# Patient Record
Sex: Female | Born: 1970
Health system: Southern US, Community
[De-identification: ages and names within clinical notes are randomized; demographics above are authoritative.]

## PROBLEM LIST (undated history)

## (undated) DIAGNOSIS — I8290 Acute embolism and thrombosis of unspecified vein: Secondary | ICD-10-CM

## (undated) DIAGNOSIS — K219 Gastro-esophageal reflux disease without esophagitis: Secondary | ICD-10-CM

## (undated) DIAGNOSIS — Z801 Family history of malignant neoplasm of trachea, bronchus and lung: Secondary | ICD-10-CM

## (undated) DIAGNOSIS — E559 Vitamin D deficiency, unspecified: Secondary | ICD-10-CM

## (undated) DIAGNOSIS — Z809 Family history of malignant neoplasm, unspecified: Secondary | ICD-10-CM

## (undated) DIAGNOSIS — R12 Heartburn: Secondary | ICD-10-CM

## (undated) DIAGNOSIS — T7840XA Allergy, unspecified, initial encounter: Secondary | ICD-10-CM

## (undated) DIAGNOSIS — Z8619 Personal history of other infectious and parasitic diseases: Secondary | ICD-10-CM

## (undated) DIAGNOSIS — K829 Disease of gallbladder, unspecified: Secondary | ICD-10-CM

## (undated) HISTORY — DX: Heartburn: R12

## (undated) HISTORY — PX: TONSILLECTOMY: SUR1361

## (undated) HISTORY — DX: Family history of malignant neoplasm of trachea, bronchus and lung: Z80.1

## (undated) HISTORY — PX: CHOLECYSTECTOMY: SHX55

## (undated) HISTORY — PX: WISDOM TOOTH EXTRACTION: SHX21

## (undated) HISTORY — PX: BREAST SURGERY: SHX581

## (undated) HISTORY — DX: Family history of malignant neoplasm, unspecified: Z80.9

## (undated) HISTORY — DX: Vitamin D deficiency, unspecified: E55.9

## (undated) HISTORY — PX: REFRACTIVE SURGERY: SHX103

## (undated) HISTORY — DX: Personal history of other infectious and parasitic diseases: Z86.19

## (undated) HISTORY — PX: SPINE SURGERY: SHX786

## (undated) HISTORY — PX: EYE SURGERY: SHX253

## (undated) HISTORY — DX: Allergy, unspecified, initial encounter: T78.40XA

## (undated) HISTORY — DX: Acute embolism and thrombosis of unspecified vein: I82.90

## (undated) HISTORY — DX: Gastro-esophageal reflux disease without esophagitis: K21.9

## (undated) HISTORY — DX: Disease of gallbladder, unspecified: K82.9

---

## 2006-08-14 ENCOUNTER — Encounter: Admission: RE | Admit: 2006-08-14 | Discharge: 2006-08-14 | Payer: Self-pay | Admitting: Gastroenterology

## 2006-09-15 ENCOUNTER — Ambulatory Visit (HOSPITAL_COMMUNITY): Admission: RE | Admit: 2006-09-15 | Discharge: 2006-09-15 | Payer: Self-pay | Admitting: General Surgery

## 2014-06-18 ENCOUNTER — Encounter (HOSPITAL_BASED_OUTPATIENT_CLINIC_OR_DEPARTMENT_OTHER): Payer: Self-pay | Admitting: Emergency Medicine

## 2014-06-18 ENCOUNTER — Emergency Department (HOSPITAL_BASED_OUTPATIENT_CLINIC_OR_DEPARTMENT_OTHER): Payer: BC Managed Care – PPO

## 2014-06-18 ENCOUNTER — Emergency Department (HOSPITAL_BASED_OUTPATIENT_CLINIC_OR_DEPARTMENT_OTHER)
Admission: EM | Admit: 2014-06-18 | Discharge: 2014-06-18 | Disposition: A | Discharge status: Home / Self Care | Payer: BC Managed Care – PPO | Attending: Emergency Medicine | Admitting: Emergency Medicine

## 2014-06-18 DIAGNOSIS — Z8619 Personal history of other infectious and parasitic diseases: Secondary | ICD-10-CM | POA: Diagnosis not present

## 2014-06-18 DIAGNOSIS — I82442 Acute embolism and thrombosis of left tibial vein: Secondary | ICD-10-CM | POA: Insufficient documentation

## 2014-06-18 DIAGNOSIS — I8290 Acute embolism and thrombosis of unspecified vein: Secondary | ICD-10-CM

## 2014-06-18 DIAGNOSIS — M79605 Pain in left leg: Secondary | ICD-10-CM | POA: Diagnosis present

## 2014-06-18 DIAGNOSIS — R52 Pain, unspecified: Secondary | ICD-10-CM

## 2014-06-18 NOTE — ED Notes (Signed)
NP at bedside, U/S completed

## 2014-06-18 NOTE — ED Notes (Addendum)
Pt presents to ED with complaints of left lower leg pain for the past week.  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

## 2014-06-18 NOTE — Discharge Instructions (Signed)
Please follow the directions provided.  It is very important that you follow-up with one of the referrals provided to help manage the thrombosis found in your calf.  According to your ultrasound there is an "Anterior tibial vein thrombosis at the location of focal pain and tenderness in left calf. No deep venous thrombosis elsewhere."  Don't hesitate to return for any new, worsening or concerning symptoms or any other symptoms listed below.     SEEK IMMEDIATE MEDICAL CARE IF:  You have chest pain.  You have trouble breathing.  You have new or increased swelling or pain in one leg.  You cough up blood.  You notice blood in vomit, in a bowel movement, or in urine.

## 2014-06-18 NOTE — ED Provider Notes (Signed)
CSN: [id number]     Arrival date & time 06/18/14  1110 History   First MD Initiated Contact with Patient 06/18/14 1305     Chief Complaint  Patient presents with  . Leg Pain   (Consider location/radiation/quality/duration/timing/severity/associated sxs/prior Treatment) HPI  Donna Mendoza is a 43 yo female presenting with report of left calf pain x 7 days.  She reports she noticed the pain upon waking 1 week ago.  She describes the pain as a "soreness" and rates appr 6-7/10.  She states it hurts worse in the morning and states it feels like a leg cramp that "doesn't go away."  She has not noticed any unilateral leg swelling or warmth in the left calf, only the soreness. 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 She does take exogenous estrogen for OCP.  She denies any chest pain, shortness of breath, hemoptysis, recent surgeries or injury.    History reviewed. No pertinent past medical history. Past Surgical History  Procedure Laterality Date  . Ruptured disc    . Cholecystectomy    . Tonsillectomy    . Breast surgery     No family history on file. History  Substance Use Topics  . Smoking status: Never Smoker   . Smokeless tobacco: Not on file  . Alcohol Use: Not on file   OB History    No data available     Review of Systems  Constitutional: Negative for fever and chills.  HENT: Negative for sore throat.   Eyes: Negative for visual disturbance.  Respiratory: Negative for cough and shortness of breath.   Cardiovascular: Negative for chest pain and leg swelling.  Gastrointestinal: Negative for nausea, vomiting and diarrhea.  Genitourinary: Negative for dysuria.  Musculoskeletal: Positive for myalgias.  Skin: Negative for rash.  Neurological: Negative for weakness, numbness and headaches.      Allergies  Amoxil  Home Medications   Prior to Admission medications   Not on File   BP 120/76 mmHg  Pulse 65  Temp(Src) 97.7 F (36.5 C) (Oral)  Resp 17  Ht  5\' 2"  (1.575 m)  Wt 185 lb (83.915 kg)  BMI 33.83 kg/m2  SpO2 97%  LMP 06/04/2014 Physical Exam  Constitutional: She appears well-developed and well-nourished. No distress.  HENT:  Head: Normocephalic and atraumatic.  Mouth/Throat: Oropharynx is clear and moist. No oropharyngeal exudate.  Eyes: Conjunctivae are normal.  Neck: Neck supple. No thyromegaly present.  Cardiovascular: Normal rate, regular rhythm and intact distal pulses.   Pulmonary/Chest: Effort normal and breath sounds normal. No respiratory distress. She has no wheezes. She has no rales. She exhibits no tenderness.  Abdominal: Soft. There is no tenderness.  Musculoskeletal: She exhibits tenderness.       Left lower leg: She exhibits tenderness. She exhibits no swelling and no edema.       Legs: Lymphadenopathy:    She has no cervical adenopathy.  Neurological: She is alert.  Skin: Skin is warm and dry. No rash noted. She is not diaphoretic.  Psychiatric: She has a normal mood and affect.  Nursing note and vitals reviewed.   ED Course  Procedures (including critical care time) Labs Review Labs Reviewed - No data to display  Imaging Review US Venous Img Lower Unilateral Left  06/18/2014   CLINICAL DATA:  Left lateral and posterior focal mid calf tightness for the past week after hitting the leg on an elliptical machine.  EXAM: LEFT LOWER EXTREMITY VENOUS DOPPLER ULTRASOUND  TECHNIQUE: Gray-scale sonography  with graded compression, as well as color Doppler and duplex ultrasound were performed to evaluate the lower extremity deep venous systems from the level of the common femoral vein and including the common femoral, femoral, profunda femoral, popliteal and calf veins including the posterior tibial, peroneal and gastrocnemius veins when visible. The superficial great saphenous vein was also interrogated. Spectral Doppler was utilized to evaluate flow at rest and with distal augmentation maneuvers in the common femoral,  femoral and popliteal veins.  COMPARISON:  None.  FINDINGS: Contralateral Common Femoral Vein: Respiratory phasicity is normal and symmetric with the symptomatic side. No evidence of thrombus. Normal compressibility.  Common Femoral Vein: No evidence of thrombus. Normal compressibility, respiratory phasicity and response to augmentation.  Saphenofemoral Junction: No evidence of thrombus. Normal compressibility and flow on color Doppler imaging.  Profunda Femoral Vein: No evidence of thrombus. Normal compressibility and flow on color Doppler imaging.  Femoral Vein: No evidence of thrombus. Normal compressibility, respiratory phasicity and response to augmentation.  Popliteal Vein: No evidence of thrombus. Normal compressibility, respiratory phasicity and response to augmentation.  Calf Veins: Thrombus occluding the proximal anterior tibial veins. The patient is focally tender at that location.  Superficial Great Saphenous Vein: No evidence of thrombus. Normal compressibility and flow on color Doppler imaging.  Venous Reflux:  None.  Other Findings:  None.  IMPRESSION: Anterior tibial vein thrombosis at the location of focal pain and tenderness in left calf. No deep venous thrombosis elsewhere.   Electronically Signed   By: Enrique Sack M.D.   On: 06/18/2014 13:41     EKG Interpretation None      MDM   Final diagnoses:  Venous thrombosis of leg   43 yo female with left calf pain  US shows anterior tibial vein thrombosis at the location of focal pain and tenderness in left calf. No deep venous thrombosis elsewhere.  Case discussed with Dr. Tamera Punt. Pt is without any other symptoms including any chest pain, or shortness of breath. Results discussed with pt and because of distal location of thrombosis, option provided to start xarelto today or follow-up with PCP to discuss use and mgmt of blood thinners vs serial ultrasounds.  Pt prefers to defer Xarelto today and will follow-up Monday.  She has been given  several referrals to allow for follow-up as soon as possible.  Strict return precautions provided including worsening pain, any chest pain, cough, hemoptysis or shortness of breath. She is well-appearing and appears reliable. She is aware of plan and in agreement.    Filed Vitals:   06/18/14 1116 06/18/14 1433  BP: 120/76 114/75  Pulse: 65 64  Temp: 97.7 F (36.5 C)   TempSrc: Oral   Resp: 17 18  Height: 5\' 2"  (1.575 m)   Weight: 185 lb (83.915 kg)   SpO2: 97% 98%   Meds given in ED:  Medications - No data to display  There are no discharge medications for this patient.      Britt Bottom, NP 06/20/14 0962  Malvin Johns, MD 06/21/14 1422

## 2014-06-20 ENCOUNTER — Ambulatory Visit (INDEPENDENT_AMBULATORY_CARE_PROVIDER_SITE_OTHER): Payer: BC Managed Care – PPO | Admitting: Physician Assistant

## 2014-06-20 ENCOUNTER — Encounter: Payer: Self-pay | Admitting: Physician Assistant

## 2014-06-20 VITALS — BP 119/84 | HR 68 | Temp 98.1°F | Resp 16 | Ht 62.0 in | Wt 195.2 lb

## 2014-06-20 DIAGNOSIS — I824Z2 Acute embolism and thrombosis of unspecified deep veins of left distal lower extremity: Secondary | ICD-10-CM

## 2014-06-20 DIAGNOSIS — Z86718 Personal history of other venous thrombosis and embolism: Secondary | ICD-10-CM | POA: Insufficient documentation

## 2014-06-20 MED ORDER — RIVAROXABAN 20 MG PO TABS: 20.0000 mg | ORAL_TABLET | Freq: Every day | ORAL | Status: DC

## 2014-06-20 MED ORDER — NORETHINDRONE ACET-ETHINYL EST 1-20 MG-MCG PO TABS: 1.0000 | ORAL_TABLET | Freq: Every day | ORAL | Status: DC

## 2014-06-20 NOTE — Assessment & Plan Note (Signed)
Will begin on Xarelto 15 mg BID x 21 days. Will then proceed with 20 mg tablets taken once daily.  Samples given to last first month. Rx Xarelto 20 mg daily and voucher given to patient. Estrogen-containing OCPs stopped.  Microgestin began today. Follow-up in 1 month. Alarm signs/symptoms discussed with patient.

## 2014-06-20 NOTE — Progress Notes (Signed)
Pre visit review using our clinic review tool, if applicable. No additional management support is needed unless otherwise documented below in the visit note/SLS  

## 2014-06-20 NOTE — Patient Instructions (Signed)
Please take the Xarelto as follows:             Take 15 mg tablet twice daily x 3 weeks.  Then begin 20 mg tablets, taking 1 tablet by mouth daily.  Will complete a total of 3 months of this medication.  Use voucher given to pick up new prescription for next months dosing.  If you develop any chest pain or shortness of breath, please call 911.  Stop current birth control and start the new pill as directed.  Follow-up in 1 month.

## 2014-06-20 NOTE — Progress Notes (Signed)
   Patient presents to clinic today to establish care and for ER follow-up. Patient seen in ER on 06/18/14 complaining of left calf pain.  US obtained revealing DVT of lower extremity.  Treatment options were discussed with patient.  Patient deferred treatment until follow-up.  Patient denies chest pain or shortness of breath.  Denies history of clot.  Is currently on estrogen-containing OCPs.  Is a non-smoker. Denies other complaints at today's visit.  Past Medical History  Diagnosis Date  . Venous thrombosis of leg     Left  . History of chicken pox     Past Surgical History  Procedure Laterality Date  . Ruptured disc    . Cholecystectomy    . Tonsillectomy    . Breast surgery      Fibroidadenoma-Left  . Wisdom tooth extraction    . Refractive surgery      Bilateral    No current outpatient prescriptions on file prior to visit.   No current facility-administered medications on file prior to visit.    Allergies  Allergen Reactions  . Amoxil [Amoxicillin] Hives    Family History  Problem Relation Age of Onset  . Brain cancer Mother 72    Deceased-Tumor  . Glaucoma Maternal Grandfather   . Glaucoma Maternal Aunt   . Cancer Maternal Grandmother   . Lung cancer Maternal Aunt   . Healthy Sister     x1    History   Social History  . Marital Status: Single    Spouse Name: N/A    Number of Children: N/A  . Years of Education: N/A   Occupational History  . Not on file.   Social History Main Topics  . Smoking status: Never Smoker   . Smokeless tobacco: Never Used  . Alcohol Use: 0.0 oz/week    0 Not specified per week     Comment: rare  . Drug Use: No  . Sexual Activity:    Partners: Male     Comment: boyfriend   Other Topics Concern  . Not on file   Social History Narrative   ROS Pertinent ROS are listed in HPI.  BP 119/84 mmHg  Pulse 68  Temp(Src) 98.1 F (36.7 C) (Oral)  Resp 16  Ht 5\' 2"  (1.575 m)  Wt 195 lb 4 oz (88.565 kg)  BMI 35.70 kg/m2   SpO2 98%  LMP 06/04/2014  Physical Exam  Constitutional: She is oriented to person, place, and time and well-developed, well-nourished, and in no distress.  HENT:  Head: Normocephalic and atraumatic.  Eyes: Conjunctivae are normal.  Neck: Neck supple.  Cardiovascular: Normal rate, regular rhythm, normal heart sounds and intact distal pulses.   Pulmonary/Chest: Effort normal and breath sounds normal. No respiratory distress. She has no wheezes. She has no rales. She exhibits no tenderness.  Peripheral pulses 2+ equal. No evidence of edema.  Neurological: She is alert and oriented to person, place, and time.  Skin: Skin is warm and dry. No rash noted.  Psychiatric: Affect normal.  Vitals reviewed.  Assessment/Plan: Acute deep vein thrombosis (DVT) of distal vein of left lower extremity Will begin on Xarelto 15 mg BID x 21 days. Will then proceed with 20 mg tablets taken once daily.  Samples given to last first month. Rx Xarelto 20 mg daily and voucher given to patient. Estrogen-containing OCPs stopped.  Microgestin began today. Follow-up in 1 month. Alarm signs/symptoms discussed with patient.

## 2014-06-23 ENCOUNTER — Other Ambulatory Visit: Payer: Self-pay | Admitting: Physician Assistant

## 2014-06-23 MED ORDER — NORETHINDRONE ACET-ETHINYL EST 1-20 MG-MCG PO TABS: 1.0000 | ORAL_TABLET | Freq: Every day | ORAL | Status: DC

## 2014-06-23 NOTE — Telephone Encounter (Signed)
[facility]

## 2014-06-23 NOTE — Telephone Encounter (Signed)
Caller name:Benko Alvita Relation to [place]:BZXY Call back number:[phone] [facility]  Reason for call: pt states Einar Pheasant was changing her birth control to [place] pt [facility] not [facility] yet pt is following up

## 2014-07-20 ENCOUNTER — Ambulatory Visit (INDEPENDENT_AMBULATORY_CARE_PROVIDER_SITE_OTHER): Payer: BC Managed Care – PPO | Admitting: Physician Assistant

## 2014-07-20 ENCOUNTER — Encounter: Payer: Self-pay | Admitting: Physician Assistant

## 2014-07-20 ENCOUNTER — Ambulatory Visit: Payer: BC Managed Care – PPO | Admitting: Physician Assistant

## 2014-07-20 VITALS — BP 140/84 | HR 67 | Temp 98.5°F | Resp 16 | Ht 62.0 in | Wt 199.5 lb

## 2014-07-20 DIAGNOSIS — I824Z2 Acute embolism and thrombosis of unspecified deep veins of left distal lower extremity: Secondary | ICD-10-CM

## 2014-07-20 DIAGNOSIS — Z3041 Encounter for surveillance of contraceptive pills: Secondary | ICD-10-CM

## 2014-07-20 NOTE — Patient Instructions (Signed)
Please continue the 20 mg daily Xarelto until two bottles have been finished. This will complete your 3 month course of medication.  Follow-up at the end of February so we can reassess things.  Return sooner if needed.  Deep Vein Thrombosis A deep vein thrombosis (DVT) is a blood clot that develops in the deep, larger veins of the leg, arm, or pelvis. These are more dangerous than clots that might form in veins near the surface of the body. A DVT can lead to serious and even life-threatening complications if the clot breaks off and travels in the bloodstream to the lungs.  A DVT can damage the valves in your leg veins so that instead of flowing upward, the blood pools in the lower leg. This is called post-thrombotic syndrome, and it can result in pain, swelling, discoloration, and sores on the leg. CAUSES Usually, several things contribute to the formation of blood clots. Contributing factors include:  The flow of blood slows down.  The inside of the vein is damaged in some way.  You have a condition that makes blood clot more easily. RISK FACTORS Some people are more likely than others to develop blood clots. Risk factors include:   Smoking.  Being overweight (obese).  Sitting or lying still for a long time. This includes long-distance travel, paralysis, or recovery from an illness or surgery. Other factors that increase risk are:   Older age, especially over 50 years of age.  Having a family history of blood clots or if you have already had a blot clot.  Having major or lengthy surgery. This is especially true for surgery on the hip, knee, or belly (abdomen). Hip surgery is particularly high risk.  Having a long, thin tube (catheter) placed inside a vein during a medical procedure.  Breaking a hip or leg.  Having cancer or cancer treatment.  Pregnancy and childbirth.  Hormone changes make the blood clot more easily during pregnancy.  The fetus puts pressure on the veins of  the pelvis.  There is a risk of injury to veins during delivery or a caesarean delivery. The risk is highest just after childbirth.  Medicines containing the female hormone estrogen. This includes birth control pills and hormone replacement therapy.  Other circulation or heart problems.  SIGNS AND SYMPTOMS When a clot forms, it can either partially or totally block the blood flow in that vein. Symptoms of a DVT can include:  Swelling of the leg or arm, especially if one side is much worse.  Warmth and redness of the leg or arm, especially if one side is much worse.  Pain in an arm or leg. If the clot is in the leg, symptoms may be more noticeable or worse when standing or walking. The symptoms of a DVT that has traveled to the lungs (pulmonary embolism, PE) usually start suddenly and include:  Shortness of breath.  Coughing.  Coughing up blood or blood-tinged mucus.  Chest pain. The chest pain is often worse with deep breaths.  Rapid heartbeat. Anyone with these symptoms should get emergency medical treatment right away. Do not wait to see if the symptoms will go away. Call your local emergency services (911 in the [place] if you have these symptoms. Do not drive yourself to the hospital. DIAGNOSIS If a DVT is suspected, your health care provider will take a full medical history and perform a physical exam. Tests that also may be required include:  Blood tests, including studies of the clotting properties of  the blood.  Ultrasound to see if you have clots in your legs or lungs.  X-rays to show the flow of blood when dye is injected into the veins (venogram).  Studies of your lungs if you have any chest symptoms. PREVENTION  Exercise the legs regularly. Take a brisk 30-minute walk every day.  Maintain a weight that is appropriate for your height.  Avoid sitting or lying in bed for long periods of time without moving your legs.  Women, particularly those over the age of 55  years, should consider the risks and benefits of taking estrogen medicines, including birth control pills.  Do not smoke, especially if you take estrogen medicines.  Long-distance travel can increase your risk of DVT. You should exercise your legs by walking or pumping the muscles every hour.  Many of the risk factors above relate to situations that exist with hospitalization, either for illness, injury, or elective surgery. Prevention may include medical and nonmedical measures.  Your health care provider will assess you for the need for venous thromboembolism prevention when you are admitted to the hospital. If you are having surgery, your surgeon will assess you the day of or day after surgery. TREATMENT Once identified, a DVT can be treated. It can also be prevented in some circumstances. Once you have had a DVT, you may be at increased risk for a DVT in the future. The most common treatment for DVT is blood-thinning (anticoagulant) medicine, which reduces the blood's tendency to clot. Anticoagulants can stop new blood clots from forming and stop old clots from growing. They cannot dissolve existing clots. Your body does this by itself over time. Anticoagulants can be given by mouth, through an IV tube, or by injection. Your health care provider will determine the best program for you. Other medicines or treatments that may be used are:  Heparin or related medicines (low molecular weight heparin) are often the first treatment for a blood clot. They act quickly. However, they cannot be taken orally and must be given either in shot form or by IV tube.  Heparin can cause a fall in a component of blood that stops bleeding and forms blood clots (platelets). You will be monitored with blood tests to be sure this does not occur.  Warfarin is an anticoagulant that can be swallowed. It takes a few days to start working, so usually heparin or related medicines are used in combination. Once warfarin is  working, heparin is usually stopped.  Factor Xa inhibitor medicines, such as rivaroxaban and apixaban, also reduce blood clotting. These medicines are taken orally and can often be used without heparin or related medicines.  Less commonly, clot dissolving drugs (thrombolytics) are used to dissolve a DVT. They carry a high risk of bleeding, so they are used mainly in severe cases where your life or a part of your body is threatened.  Very rarely, a blood clot in the leg needs to be removed surgically.  If you are unable to take anticoagulants, your health care provider may arrange for you to have a filter placed in a main vein in your abdomen. This filter prevents clots from traveling to your lungs. HOME CARE INSTRUCTIONS  Take all medicines as directed by your health care provider.  Learn as much as you can about DVT.  Wear a medical alert bracelet or carry a medical alert card.  Ask your health care provider how soon you can go back to normal activities. It is important to stay active to  prevent blood clots. If you are on anticoagulant medicine, avoid contact sports.  It is very important to exercise. This is especially important while traveling, sitting, or standing for long periods of time. Exercise your legs by walking or by tightening and relaxing your leg muscles regularly. Take frequent walks.  You may need to wear compression stockings. These are tight elastic stockings that apply pressure to the lower legs. This pressure can help keep the blood in the legs from clotting. Taking Warfarin Warfarin is a daily medicine that is taken by mouth. Your health care provider will advise you on the length of treatment (usually 3-6 months, sometimes lifelong). If you take warfarin:  Understand how to take warfarin and foods that can affect how warfarin works in Veterinary surgeon.  Too much and too little warfarin are both dangerous. Too much warfarin increases the risk of bleeding. Too little warfarin  continues to allow the risk for blood clots. Warfarin and Regular Blood Testing While taking warfarin, you will need to have regular blood tests to measure your blood clotting time. These blood tests usually include both the prothrombin time (PT) and international normalized ratio (INR) tests. The PT and INR results allow your health care provider to adjust your dose of warfarin. It is very important that you have your PT and INR tested as often as directed by your health care provider.  Warfarin and Your Diet Avoid major changes in your diet, or notify your health care provider before changing your diet. Arrange a visit with a registered dietitian to answer your questions. Many foods, especially foods high in vitamin K, can interfere with warfarin and affect the PT and INR results. You should eat a consistent amount of foods high in vitamin K. Foods high in vitamin K include:   Spinach, kale, broccoli, cabbage, collard and turnip greens, Brussels sprouts, peas, cauliflower, seaweed, and parsley.  Beef and pork liver.  Green tea.  Soybean oil. Warfarin with Other Medicines Many medicines can interfere with warfarin and affect the PT and INR results. You must:  Tell your health care provider about any and all medicines, vitamins, and supplements you take, including aspirin and other over-the-counter anti-inflammatory medicines. Be especially cautious with aspirin and anti-inflammatory medicines. Ask your health care provider before taking these.  Do not take or discontinue any prescribed or over-the-counter medicine except on the advice of your health care provider or pharmacist. Warfarin Side Effects Warfarin can have side effects, such as easy bruising and difficulty stopping bleeding. Ask your health care provider or pharmacist about other side effects of warfarin. You will need to:  Hold pressure over cuts for longer than usual.  Notify your dentist and other health care providers that  you are taking warfarin before you undergo any procedures where bleeding may occur. Warfarin with Alcohol and Tobacco   Drinking alcohol frequently can increase the effect of warfarin, leading to excess bleeding. It is best to avoid alcoholic drinks or to consume only very small amounts while taking warfarin. Notify your health care provider if you change your alcohol intake.   Do not use any tobacco products including cigarettes, chewing tobacco, or electronic cigarettes. If you smoke, quit. Ask your health care provider for help with quitting smoking. Alternative Medicines to Warfarin: Factor Xa Inhibitor Medicines  These blood-thinning medicines are taken by mouth, usually for several weeks or longer. It is important to take the medicine every single day at the same time each day.  There are no regular  blood tests required when using these medicines.  There are fewer food and drug interactions than with warfarin.  The side effects of this class of medicine are similar to those of warfarin, including excessive bruising or bleeding. Ask your health care provider or pharmacist about other potential side effects. SEEK MEDICAL CARE IF:  You notice a rapid heartbeat.  You feel weaker or more tired than usual.  You feel faint.  You notice increased bruising.  You feel your symptoms are not getting better in the time expected.  You believe you are having side effects of medicine. SEEK IMMEDIATE MEDICAL CARE IF:  You have chest pain.  You have trouble breathing.  You have new or increased swelling or pain in one leg.  You cough up blood.  You notice blood in vomit, in a bowel movement, or in urine. MAKE SURE YOU:  Understand these instructions.  Will watch your condition.  Will get help right away if you are not doing well or get worse. Document Released: 07/08/2005 Document Revised: 11/22/2013 Document Reviewed: 03/15/2013 [facility] Patient Information 2015 [place], [place].  This information is not intended to replace advice given to you by your health care provider. Make sure you discuss any questions you have with your health care provider.

## 2014-07-20 NOTE — Progress Notes (Signed)
Pre visit review using our clinic review tool, if applicable. No additional management support is needed unless otherwise documented below in the visit note/SLS  

## 2014-07-24 DIAGNOSIS — Z309 Encounter for contraceptive management, unspecified: Secondary | ICD-10-CM | POA: Insufficient documentation

## 2014-07-24 NOTE — Assessment & Plan Note (Signed)
Patient doing well. She is to continue current regimen of Cymbalta 20 mg daily. Will continue medication regimen until a total of 3 months of therapy has been completed. Again alarm signs/symptoms have been reiterated to patient. Follow-up in 2 months.

## 2014-07-24 NOTE — Assessment & Plan Note (Signed)
Currently avoiding estrogen containing oral contraceptives giving recent DVT. Patient doing well on Microgestin daily. Will continue the same.

## 2014-07-24 NOTE — Progress Notes (Signed)
Patient presents to clinic today for one-month follow-up after being diagnosed with a DVT of left lower extremity. Patient was placed on anticoagulant therapy with relative. Is now currently on 20 mg daily. Patient endorses taking medication as directed. Patient states swelling of the leg is much improved. Denies calf pain, chest pain or shortness of breath. Patient has started the new progesterone only oral contraceptive pill and denies any noted side effects.  Past Medical History  Diagnosis Date  . Venous thrombosis of leg     Left  . History of chicken pox     Current Outpatient Prescriptions on File Prior to Visit  Medication Sig Dispense Refill  . Multiple Vitamin (MULTIVITAMIN) tablet Take 1 tablet by mouth as directed.    . norethindrone-ethinyl estradiol (MICROGESTIN) 1-20 MG-MCG tablet Take 1 tablet by mouth daily. 3 Package 1  . rivaroxaban (XARELTO) 20 MG TABS tablet Take 1 tablet (20 mg total) by mouth daily with supper. 30 tablet 0   No current facility-administered medications on file prior to visit.    Allergies  Allergen Reactions  . Amoxil [Amoxicillin] Hives    Family History  Problem Relation Age of Onset  . Brain cancer Mother 59    Deceased-Tumor  . Glaucoma Maternal Grandfather   . Glaucoma Maternal Aunt   . Cancer Maternal Grandmother   . Lung cancer Maternal Aunt   . Healthy Sister     x1    History   Social History  . Marital Status: Single    Spouse Name: N/A    Number of Children: N/A  . Years of Education: N/A   Social History Main Topics  . Smoking status: Never Smoker   . Smokeless tobacco: Never Used  . Alcohol Use: 0.0 oz/week    0 Not specified per week     Comment: rare  . Drug Use: No  . Sexual Activity:    Partners: Male     Comment: boyfriend   Other Topics Concern  . None   Social History Narrative   Review of Systems - See HPI.  All other ROS are negative.  BP 140/84 mmHg  Pulse 67  Temp(Src) 98.5 F (36.9  C) (Oral)  Resp 16  Ht 5\' 2"  (1.575 m)  Wt 199 lb 8 oz (90.493 kg)  BMI 36.48 kg/m2  SpO2 99%  LMP 07/03/2014  Physical Exam  Constitutional: She is oriented to person, place, and time and well-developed, well-nourished, and in no distress.  HENT:  Head: Normocephalic and atraumatic.  Eyes: Conjunctivae are normal. Pupils are equal, round, and reactive to light.  Neck: Neck supple.  Cardiovascular: Normal rate, regular rhythm, normal heart sounds and intact distal pulses.   Pulmonary/Chest: Effort normal and breath sounds normal. No respiratory distress. She has no wheezes. She has no rales. She exhibits no tenderness.  Musculoskeletal: Normal range of motion. She exhibits no edema or tenderness.  Neurological: She is alert and oriented to person, place, and time.  Skin: Skin is warm and dry. No rash noted.  Psychiatric: Affect normal.  Vitals reviewed.  Assessment/Plan: Acute deep vein thrombosis (DVT) of distal vein of left lower extremity Patient doing well. She is to continue current regimen of Cymbalta 20 mg daily. Will continue medication regimen until a total of 3 months of therapy has been completed. Again alarm signs/symptoms have been reiterated to patient. Follow-up in 2 months.  Contraceptive management Currently avoiding estrogen containing oral contraceptives giving recent DVT. Patient doing well  on Microgestin daily. Will continue the same.

## 2014-07-26 ENCOUNTER — Telehealth: Payer: Self-pay

## 2014-07-26 MED ORDER — RIVAROXABAN 20 MG PO TABS: 20.0000 mg | ORAL_TABLET | Freq: Every day | ORAL | Status: DC

## 2014-07-26 NOTE — Telephone Encounter (Signed)
Rx request to pharmacy/[facility]  

## 2014-07-26 NOTE — Telephone Encounter (Signed)
Donna Mendoza  [phone] [place] called to check on refill for rivaroxaban (XARELTO) 20 MG TABS tablet, she still has about 3 weeks supply, she just did not want to run out.

## 2014-07-27 MED ORDER — RIVAROXABAN 20 MG PO TABS: 20.0000 mg | ORAL_TABLET | Freq: Every day | ORAL | Status: DC

## 2014-07-27 NOTE — Telephone Encounter (Signed)
[facility]

## 2014-07-27 NOTE — Addendum Note (Signed)
Addended by: Rockwell Germany on: 07/27/2014 10:18 AM   Modules accepted: Orders

## 2014-07-27 NOTE — Telephone Encounter (Signed)
Rx to local pharmacy/[facility]

## 2014-07-27 NOTE — Telephone Encounter (Signed)
Per patient, refill should have been sent to [facility] on [address]

## 2014-09-20 ENCOUNTER — Ambulatory Visit (INDEPENDENT_AMBULATORY_CARE_PROVIDER_SITE_OTHER): Payer: BLUE CROSS/BLUE SHIELD | Admitting: Physician Assistant

## 2014-09-20 ENCOUNTER — Encounter: Payer: Self-pay | Admitting: Physician Assistant

## 2014-09-20 VITALS — BP 116/69 | HR 62 | Temp 98.4°F | Resp 16 | Ht 62.0 in | Wt 198.1 lb

## 2014-09-20 DIAGNOSIS — I824Z2 Acute embolism and thrombosis of unspecified deep veins of left distal lower extremity: Secondary | ICD-10-CM

## 2014-09-20 DIAGNOSIS — R10819 Abdominal tenderness, unspecified site: Secondary | ICD-10-CM

## 2014-09-20 LAB — CBC WITH DIFFERENTIAL/PLATELET
Basophils Absolute: 0 10*3/uL (ref 0.0–0.1)
Basophils Relative: 0.5 % (ref 0.0–3.0)
Eosinophils Absolute: 0.1 10*3/uL (ref 0.0–0.7)
Eosinophils Relative: 1.8 % (ref 0.0–5.0)
HCT: 44.6 % (ref 36.0–46.0)
Hemoglobin: 15.2 g/dL — ABNORMAL HIGH (ref 12.0–15.0)
Lymphocytes Relative: 21 % (ref 12.0–46.0)
Lymphs Abs: 1.7 10*3/uL (ref 0.7–4.0)
MCHC: 34.1 g/dL (ref 30.0–36.0)
MCV: 87.2 fl (ref 78.0–100.0)
Monocytes Absolute: 0.5 10*3/uL (ref 0.1–1.0)
Monocytes Relative: 6.3 % (ref 3.0–12.0)
Neutro Abs: 5.6 10*3/uL (ref 1.4–7.7)
Neutrophils Relative %: 70.4 % (ref 43.0–77.0)
Platelets: 270 10*3/uL (ref 150.0–400.0)
RBC: 5.11 Mil/uL (ref 3.87–5.11)
RDW: 13.8 % (ref 11.5–15.5)
WBC: 7.9 10*3/uL (ref 4.0–10.5)

## 2014-09-20 LAB — COMPREHENSIVE METABOLIC PANEL
ALT: 15 U/L (ref 0–35)
AST: 17 U/L (ref 0–37)
Albumin: 3.8 g/dL (ref 3.5–5.2)
Alkaline Phosphatase: 65 U/L (ref 39–117)
BUN: 19 mg/dL (ref 6–23)
CO2: 30 mEq/L (ref 19–32)
Calcium: 9 mg/dL (ref 8.4–10.5)
Chloride: 105 mEq/L (ref 96–112)
Creatinine, Ser: 0.75 mg/dL (ref 0.40–1.20)
GFR: 88.96 mL/min (ref >60.00)
Glucose, Bld: 100 mg/dL — ABNORMAL HIGH (ref 70–99)
Potassium: 4.3 mEq/L (ref 3.5–5.1)
Sodium: 138 mEq/L (ref 135–145)
Total Bilirubin: 0.4 mg/dL (ref 0.2–1.2)
Total Protein: 7.1 g/dL (ref 6.0–8.3)

## 2014-09-20 LAB — URINALYSIS, ROUTINE W REFLEX MICROSCOPIC
Bilirubin Urine: NEGATIVE
Hgb urine dipstick: NEGATIVE
Ketones, ur: NEGATIVE
Leukocytes, UA: NEGATIVE
Nitrite: NEGATIVE
RBC / HPF: NONE SEEN (ref 0–?)
Specific Gravity, Urine: 1.025 (ref 1.000–1.030)
Total Protein, Urine: NEGATIVE
Urine Glucose: NEGATIVE
Urobilinogen, UA: 0.2 (ref 0.0–1.0)
WBC, UA: NONE SEEN (ref 0–?)
pH: 6 (ref 5.0–8.0)

## 2014-09-20 MED ORDER — NORETHINDRONE ACET-ETHINYL EST 1-20 MG-MCG PO TABS: 1.0000 | ORAL_TABLET | Freq: Every day | ORAL | Status: DC

## 2014-09-20 NOTE — Patient Instructions (Signed)
Please stop by the lab for blood work. I will call you with your results. Since we are stopping the Xarelto, you can alternate between Tylenol and Ibuprofen for the pain.  I will get your MRI from the spine specialist.  I will review so we can determine what further workup is needed to look at your side.

## 2014-09-20 NOTE — Progress Notes (Signed)
Patient presents to clinic today for follow-up of DVT of LLE having just completed 3 months of Xarelto therapy.  No history of prior DVT.  Denies hx of hypercoagulable state.  Denies residual swelling or calf pain/tenderness. Is staying active.  Patient does endorse history of left renal cyst/mass on MRI obtained from previous PMD. Has been asymptomatic, but patient endorses over the past couple of weeks she has had intermittent L side and flank pain.  Denies urinary symptoms.  Denies history of neprholithiaisis  Past Medical History  Diagnosis Date  . Venous thrombosis of leg     Left  . History of chicken pox     Current Outpatient Prescriptions on File Prior to Visit  Medication Sig Dispense Refill  . Multiple Vitamin (MULTIVITAMIN) tablet Take 1 tablet by mouth as directed.     No current facility-administered medications on file prior to visit.    Allergies  Allergen Reactions  . Amoxil [Amoxicillin] Hives    Family History  Problem Relation Age of Onset  . Brain cancer Mother 78    Deceased-Tumor  . Glaucoma Maternal Grandfather   . Glaucoma Maternal Aunt   . Cancer Maternal Grandmother   . Lung cancer Maternal Aunt   . Healthy Sister     x1    History   Social History  . Marital Status: Single    Spouse Name: N/A  . Number of Children: N/A  . Years of Education: N/A   Social History Main Topics  . Smoking status: Never Smoker   . Smokeless tobacco: Never Used  . Alcohol Use: 0.0 oz/week    0 Standard drinks or equivalent per week     Comment: rare  . Drug Use: No  . Sexual Activity:    Partners: Male     Comment: boyfriend   Other Topics Concern  . None   Social History Narrative   Review of Systems - See HPI.  All other ROS are negative.  BP 116/69 mmHg  Pulse 62  Temp(Src) 98.4 F (36.9 C) (Oral)  Resp 16  Ht 5\' 2"  (1.575 m)  Wt 198 lb 2 oz (89.869 kg)  BMI 36.23 kg/m2  SpO2 99%  LMP 08/31/2014  Physical Exam  Constitutional: She  is oriented to person, place, and time and well-developed, well-nourished, and in no distress.  HENT:  Head: Normocephalic and atraumatic.  Cardiovascular: Normal rate, regular rhythm, normal heart sounds and intact distal pulses.   Pulmonary/Chest: Effort normal and breath sounds normal. No respiratory distress. She has no wheezes. She has no rales. She exhibits no tenderness.  Abdominal: Soft. Bowel sounds are normal. She exhibits no distension and no mass. There is no tenderness. There is no rebound and no guarding.  Neurological: She is alert and oriented to person, place, and time.  Skin: Skin is warm and dry. No rash noted.  Psychiatric: Affect normal.  Vitals reviewed.  Assessment/Plan: Acute deep vein thrombosis (DVT) of distal vein of left lower extremity 1st and only episode. Patient is off of estrogen-containing OCPs. HAs completed 3 months of some relative therapy. She is asymptomatic. Examination within normal limits. Okay to discontinue Xarelto. Patient has been educated on alarm signs or symptoms that would indicate possible recurrence of DVT.   Abdominal tenderness in left flank Unclear etiology. Do not suspect pyelonephritis. Possible muscular etiology, although there is not significant tenderness on examination. Will obtain CBC, CMP and UA. Will obtain record of previous MRI. Once reviewed, we will proceed  with repeat imaging.

## 2014-09-21 DIAGNOSIS — R108A2 Left flank tenderness: Secondary | ICD-10-CM | POA: Insufficient documentation

## 2014-09-21 DIAGNOSIS — R10819 Abdominal tenderness, unspecified site: Secondary | ICD-10-CM | POA: Insufficient documentation

## 2014-09-21 NOTE — Assessment & Plan Note (Signed)
1st and only episode. Patient is off of estrogen-containing OCPs. HAs completed 3 months of some relative therapy. She is asymptomatic. Examination within normal limits. Okay to discontinue Xarelto. Patient has been educated on alarm signs or symptoms that would indicate possible recurrence of DVT.

## 2014-09-21 NOTE — Assessment & Plan Note (Signed)
Unclear etiology. Do not suspect pyelonephritis. Possible muscular etiology, although there is not significant tenderness on examination. Will obtain CBC, CMP and UA. Will obtain record of previous MRI. Once reviewed, we will proceed with repeat imaging.

## 2014-11-07 ENCOUNTER — Encounter: Payer: Self-pay | Admitting: Physician Assistant

## 2015-02-14 ENCOUNTER — Encounter: Payer: Self-pay | Admitting: Physician Assistant

## 2015-02-14 ENCOUNTER — Ambulatory Visit (INDEPENDENT_AMBULATORY_CARE_PROVIDER_SITE_OTHER): Payer: BLUE CROSS/BLUE SHIELD | Admitting: Physician Assistant

## 2015-02-14 VITALS — BP 108/88 | HR 69 | Temp 98.1°F | Ht 62.0 in | Wt 203.2 lb

## 2015-02-14 DIAGNOSIS — Z136 Encounter for screening for cardiovascular disorders: Secondary | ICD-10-CM | POA: Insufficient documentation

## 2015-02-14 DIAGNOSIS — L810 Postinflammatory hyperpigmentation: Secondary | ICD-10-CM | POA: Diagnosis not present

## 2015-02-14 DIAGNOSIS — Z0000 Encounter for general adult medical examination without abnormal findings: Secondary | ICD-10-CM | POA: Diagnosis not present

## 2015-02-14 DIAGNOSIS — R638 Other symptoms and signs concerning food and fluid intake: Secondary | ICD-10-CM

## 2015-02-14 LAB — COMPREHENSIVE METABOLIC PANEL
ALT: 27 U/L (ref 0–35)
AST: 25 U/L (ref 0–37)
Albumin: 3.6 g/dL (ref 3.5–5.2)
Alkaline Phosphatase: 73 U/L (ref 39–117)
BUN: 12 mg/dL (ref 6–23)
CO2: 26 mEq/L (ref 19–32)
Calcium: 8.7 mg/dL (ref 8.4–10.5)
Chloride: 107 mEq/L (ref 96–112)
Creatinine, Ser: 0.63 mg/dL (ref 0.40–1.20)
GFR: 108.59 mL/min (ref >60.00)
Glucose, Bld: 81 mg/dL (ref 70–99)
Potassium: 3.6 mEq/L (ref 3.5–5.1)
Sodium: 140 mEq/L (ref 135–145)
Total Bilirubin: 0.6 mg/dL (ref 0.2–1.2)
Total Protein: 6.4 g/dL (ref 6.0–8.3)

## 2015-02-14 LAB — LIPID PANEL
Cholesterol: 163 mg/dL (ref 0–200)
HDL: 54.3 mg/dL (ref >39.00)
LDL Cholesterol: 94 mg/dL (ref 0–99)
NonHDL: 108.7
Total CHOL/HDL Ratio: 3
Triglycerides: 75 mg/dL (ref 0.0–149.0)
VLDL: 15 mg/dL (ref 0.0–40.0)

## 2015-02-14 LAB — URINALYSIS, ROUTINE W REFLEX MICROSCOPIC
Bilirubin Urine: NEGATIVE
Ketones, ur: NEGATIVE
Leukocytes, UA: NEGATIVE
Nitrite: NEGATIVE
Specific Gravity, Urine: 1.025 (ref 1.000–1.030)
Total Protein, Urine: NEGATIVE
Urine Glucose: NEGATIVE
Urobilinogen, UA: 0.2 (ref 0.0–1.0)
pH: 5.5 (ref 5.0–8.0)

## 2015-02-14 LAB — T4, FREE: Free T4: 0.82 ng/dL (ref 0.60–1.60)

## 2015-02-14 LAB — CBC
HCT: 40.5 % (ref 36.0–46.0)
Hemoglobin: 13.6 g/dL (ref 12.0–15.0)
MCHC: 33.7 g/dL (ref 30.0–36.0)
MCV: 89.3 fl (ref 78.0–100.0)
Platelets: 268 10*3/uL (ref 150.0–400.0)
RBC: 4.53 Mil/uL (ref 3.87–5.11)
RDW: 13.5 % (ref 11.5–15.5)
WBC: 6.9 10*3/uL (ref 4.0–10.5)

## 2015-02-14 LAB — HEMOGLOBIN A1C: Hgb A1c MFr Bld: 5.6 % (ref 4.6–6.5)

## 2015-02-14 LAB — TSH: TSH: 0.87 u[IU]/mL (ref 0.35–4.50)

## 2015-02-14 NOTE — Assessment & Plan Note (Signed)
Difficulty with weight loss despite daily exercise regimen and decreased caloric intake. Cold intolerance noted. Exam without thyromegaly or palpable nodule. Reflexes WNL. Will check TSH and T4 today.

## 2015-02-14 NOTE — Assessment & Plan Note (Signed)
Depression screen negative. Followed by [facility]YN [facility]) who has updated mammogram and PAP, both unremarkable per patient. Preventive schedule reviewed and handout given in AVS. Tetanus due but patient declines today.  Will obtain fasting labs today.

## 2015-02-14 NOTE — Progress Notes (Signed)
Patient presents to clinic today for annual exam.  Patient is fasting for labs.   Acute Concerns: (1) Patient endorses difficulty with weight loss despite rigorous exercise regimen daily and decreased caloric intake. Denies history of abnormal thyroid testing. Endorses some fatigue, cold natured. Denies known family history of thyroid disorder.  (2) Patient also endorses bite to left anterior thigh x 1.5 month that is non-painful, non-pruritic but is having some delayed healing. Denies drainage. Denies fever, chills or redness.  Health Maintenance: Dental -- up-to-date Vision -- up-to-date Immunizations -- Due for Tetanus. Mammogram -- Followed by GYN [facility]). PAP -- Followed by GYN [facility] Gyn). January 04, 2015. Pap normal  Past Medical History  Diagnosis Date  . Venous thrombosis of leg     Left  . History of chicken pox     Past Surgical History  Procedure Laterality Date  . Ruptured disc    . Cholecystectomy    . Tonsillectomy    . Breast surgery      Fibroidadenoma-Left  . Wisdom tooth extraction    . Refractive surgery      Bilateral    Current Outpatient Prescriptions on File Prior to Visit  Medication Sig Dispense Refill  . Multiple Vitamin (MULTIVITAMIN) tablet Take 1 tablet by mouth as directed.    . norethindrone-ethinyl estradiol (MICROGESTIN) 1-20 MG-MCG tablet Take 1 tablet by mouth daily. (Patient not taking: Reported on 02/14/2015) 3 Package 0   No current facility-administered medications on file prior to visit.    Allergies  Allergen Reactions  . Amoxil [Amoxicillin] Hives    Family History  Problem Relation Age of Onset  . Brain cancer Mother 32    Deceased-Tumor  . Glaucoma Maternal Grandfather   . Glaucoma Maternal Aunt   . Cancer Maternal Grandmother   . Lung cancer Maternal Aunt   . Healthy Sister     x1    History   Social History  . Marital Status: Single    Spouse Name: N/A  . Number of Children: N/A  . Years of  Education: N/A   Occupational History  . Not on file.   Social History Main Topics  . Smoking status: Never Smoker   . Smokeless tobacco: Never Used  . Alcohol Use: 0.0 oz/week    0 Standard drinks or equivalent per week     Comment: rare  . Drug Use: No  . Sexual Activity:    Partners: Male     Comment: boyfriend   Other Topics Concern  . Not on file   Social History Narrative   Review of Systems  Constitutional: Negative for fever and weight loss.  HENT: Negative for ear discharge, ear pain, hearing loss and tinnitus.   Eyes: Negative for blurred vision, double vision, photophobia and pain.  Respiratory: Negative for cough and shortness of breath.   Cardiovascular: Negative for chest pain and palpitations.  Gastrointestinal: Negative for heartburn, nausea, vomiting, abdominal pain, diarrhea, constipation, blood in stool and melena.  Genitourinary: Negative for dysuria, urgency, frequency, hematuria and flank pain.  Musculoskeletal: Negative for falls.  Neurological: Negative for dizziness, loss of consciousness and headaches.  Endo/Heme/Allergies: Negative for environmental allergies.  Psychiatric/Behavioral: Negative for depression, suicidal ideas, hallucinations and substance abuse. The patient is not nervous/anxious and does not have insomnia.    BP 108/88 mmHg  Pulse 69  Temp(Src) 98.1 F (36.7 C) (Oral)  Ht 5\' 2"  (1.575 m)  Wt 203 lb 3.2 oz (92.171 kg)  BMI  37.16 kg/m2  SpO2 98%  LMP 09/05/2014  Physical Exam  Constitutional: She is oriented to person, place, and time and well-developed, well-nourished, and in no distress.  HENT:  Head: Normocephalic and atraumatic.  Right Ear: Tympanic membrane, external ear and ear canal normal.  Left Ear: Tympanic membrane, external ear and ear canal normal.  Nose: Nose normal. No mucosal edema.  Mouth/Throat: Uvula is midline, oropharynx is clear and moist and mucous membranes are normal. No oropharyngeal exudate or  posterior oropharyngeal erythema.  Eyes: Conjunctivae are normal. Pupils are equal, round, and reactive to light.  Neck: Neck supple. No thyromegaly present.  Cardiovascular: Normal rate, regular rhythm, normal heart sounds and intact distal pulses.   Pulmonary/Chest: Effort normal and breath sounds normal. No respiratory distress. She has no wheezes. She has no rales.  Abdominal: Soft. Bowel sounds are normal. She exhibits no distension and no mass. There is no tenderness. There is no rebound and no guarding.  Lymphadenopathy:    She has no cervical adenopathy.  Neurological: She is alert and oriented to person, place, and time. No cranial nerve deficit.  Skin: Skin is warm and dry.     Psychiatric: Affect normal.  Vitals reviewed.    Assessment/Plan: Postinflammatory hyperpigmentation Reassurance given. Will gradually fade with skin cell turnover. Keep skin moisturized.  Visit for preventive health examination Depression screen negative. Followed by [facility]YN [facility]) who has updated mammogram and PAP, both unremarkable per patient. Preventive schedule reviewed and handout given in AVS. Tetanus due but patient declines today.  Will obtain fasting labs today.  Difficulty maintaining weight Difficulty with weight loss despite daily exercise regimen and decreased caloric intake. Cold intolerance noted. Exam without thyromegaly or palpable nodule. Reflexes WNL. Will check TSH and T4 today.  Screening for ischemic heart disease EKG with NSR. Will check fasting lipid panel today. BP controlled.

## 2015-02-14 NOTE — Patient Instructions (Addendum)
Please go to the lab for blood work. I will call you with your results. Your EKG looks good.   The darkened area on your thigh is increased pigmentation from inflammation. This will improve gradually over time.  I am checking your thyroid function in addition to your basic physical blood work to rule out a thyroid abnormality that is contributing to your difficulty with weight loss. Please continue your diet and exercise regimen. We will manage this issue based on your results.  Follow-up will be based on blood work results. Remember to follow-up each year for a physical.  It was nice to see you today!  Preventive Care for Adults A healthy lifestyle and preventive care can promote health and wellness. Preventive health guidelines for women include the following key practices.  A routine yearly physical is a good way to check with your health care provider about your health and preventive screening. It is a chance to share any concerns and updates on your health and to receive a thorough exam.  Visit your dentist for a routine exam and preventive care every 6 months. Brush your teeth twice a day and floss once a day. Good oral hygiene prevents tooth decay and gum disease.  The frequency of eye exams is based on your age, health, family medical history, use of contact lenses, and other factors. Follow your health care provider's recommendations for frequency of eye exams.  Eat a healthy diet. Foods like vegetables, fruits, whole grains, low-fat dairy products, and lean protein foods contain the nutrients you need without too many calories. Decrease your intake of foods high in solid fats, added sugars, and salt. Eat the right amount of calories for you.Get information about a proper diet from your health care provider, if necessary.  Regular physical exercise is one of the most important things you can do for your health. Most adults should get at least 150 minutes of moderate-intensity  exercise (any activity that increases your heart rate and causes you to sweat) each week. In addition, most adults need muscle-strengthening exercises on 2 or more days a week.  Maintain a healthy weight. The body mass index (BMI) is a screening tool to identify possible weight problems. It provides an estimate of body fat based on height and weight. Your health care provider can find your BMI and can help you achieve or maintain a healthy weight.For adults 20 years and older:  A BMI below 18.5 is considered underweight.  A BMI of 18.5 to 24.9 is normal.  A BMI of 25 to 29.9 is considered overweight.  A BMI of 30 and above is considered obese.  Maintain normal blood lipids and cholesterol levels by exercising and minimizing your intake of saturated fat. Eat a balanced diet with plenty of fruit and vegetables. Blood tests for lipids and cholesterol should begin at age 94 and be repeated every 5 years. If your lipid or cholesterol levels are high, you are over 50, or you are at high risk for heart disease, you may need your cholesterol levels checked more frequently.Ongoing high lipid and cholesterol levels should be treated with medicines if diet and exercise are not working.  If you smoke, find out from your health care provider how to quit. If you do not use tobacco, do not start.  Lung cancer screening is recommended for adults aged 37-80 years who are at high risk for developing lung cancer because of a history of smoking. A yearly low-dose CT scan of the lungs is  recommended for people who have at least a 30-pack-year history of smoking and are a current smoker or have quit within the past 15 years. A pack year of smoking is smoking an average of 1 pack of cigarettes a day for 1 year (for example: 1 pack a day for 30 years or 2 packs a day for 15 years). Yearly screening should continue until the smoker has stopped smoking for at least 15 years. Yearly screening should be stopped for people who  develop a health problem that would prevent them from having lung cancer treatment.  If you are pregnant, do not drink alcohol. If you are breastfeeding, be very cautious about drinking alcohol. If you are not pregnant and choose to drink alcohol, do not have more than 1 drink per day. One drink is considered to be 12 ounces (355 mL) of beer, 5 ounces (148 mL) of wine, or 1.5 ounces (44 mL) of liquor.  Avoid use of street drugs. Do not share needles with anyone. Ask for help if you need support or instructions about stopping the use of drugs.  High blood pressure causes heart disease and increases the risk of stroke. Your blood pressure should be checked at least every 1 to 2 years. Ongoing high blood pressure should be treated with medicines if weight loss and exercise do not work.  If you are 57-62 years old, ask your health care provider if you should take aspirin to prevent strokes.  Diabetes screening involves taking a blood sample to check your fasting blood sugar level. This should be done once every 3 years, after age 26, if you are within normal weight and without risk factors for diabetes. Testing should be considered at a younger age or be carried out more frequently if you are overweight and have at least 1 risk factor for diabetes.  Breast cancer screening is essential preventive care for women. You should practice "breast self-awareness." This means understanding the normal appearance and feel of your breasts and may include breast self-examination. Any changes detected, no matter how small, should be reported to a health care provider. Women in their 46s and 30s should have a clinical breast exam (CBE) by a health care provider as part of a regular health exam every 1 to 3 years. After age 25, women should have a CBE every year. Starting at age 69, women should consider having a mammogram (breast X-ray test) every year. Women who have a family history of breast cancer should talk to their  health care provider about genetic screening. Women at a high risk of breast cancer should talk to their health care providers about having an MRI and a mammogram every year.  Breast cancer gene (BRCA)-related cancer risk assessment is recommended for women who have family members with BRCA-related cancers. BRCA-related cancers include breast, ovarian, tubal, and peritoneal cancers. Having family members with these cancers may be associated with an increased risk for harmful changes (mutations) in the breast cancer genes BRCA1 and BRCA2. Results of the assessment will determine the need for genetic counseling and BRCA1 and BRCA2 testing.  Routine pelvic exams to screen for cancer are no longer recommended for nonpregnant women who are considered low risk for cancer of the pelvic organs (ovaries, uterus, and vagina) and who do not have symptoms. Ask your health care provider if a screening pelvic exam is right for you.  If you have had past treatment for cervical cancer or a condition that could lead to cancer, you need  Pap tests and screening for cancer for at least 20 years after your treatment. If Pap tests have been discontinued, your risk factors (such as having a new sexual partner) need to be reassessed to determine if screening should be resumed. Some women have medical problems that increase the chance of getting cervical cancer. In these cases, your health care provider may recommend more frequent screening and Pap tests.  The HPV test is an additional test that may be used for cervical cancer screening. The HPV test looks for the virus that can cause the cell changes on the cervix. The cells collected during the Pap test can be tested for HPV. The HPV test could be used to screen women aged 57 years and older, and should be used in women of any age who have unclear Pap test results. After the age of 40, women should have HPV testing at the same frequency as a Pap test.  Colorectal cancer can be  detected and often prevented. Most routine colorectal cancer screening begins at the age of 81 years and continues through age 95 years. However, your health care provider may recommend screening at an earlier age if you have risk factors for colon cancer. On a yearly basis, your health care provider may provide home test kits to check for hidden blood in the stool. Use of a small camera at the end of a tube, to directly examine the colon (sigmoidoscopy or colonoscopy), can detect the earliest forms of colorectal cancer. Talk to your health care provider about this at age 35, when routine screening begins. Direct exam of the colon should be repeated every 5-10 years through age 30 years, unless early forms of pre-cancerous polyps or small growths are found.  People who are at an increased risk for hepatitis B should be screened for this virus. You are considered at high risk for hepatitis B if:  You were born in a country where hepatitis B occurs often. Talk with your health care provider about which countries are considered high risk.  Your parents were born in a high-risk country and you have not received a shot to protect against hepatitis B (hepatitis B vaccine).  You have HIV or AIDS.  You use needles to inject street drugs.  You live with, or have sex with, someone who has hepatitis B.  You get hemodialysis treatment.  You take certain medicines for conditions like cancer, organ transplantation, and autoimmune conditions.  Hepatitis C blood testing is recommended for all people born from [birth year] through 1965 and any individual with known risks for hepatitis C.  Practice safe sex. Use condoms and avoid high-risk sexual practices to reduce the spread of sexually transmitted infections (STIs). STIs include gonorrhea, chlamydia, syphilis, trichomonas, herpes, HPV, and human immunodeficiency virus (HIV). Herpes, HIV, and HPV are viral illnesses that have no cure. They can result in disability,  cancer, and death.  You should be screened for sexually transmitted illnesses (STIs) including gonorrhea and chlamydia if:  You are sexually active and are younger than 24 years.  You are older than 24 years and your health care provider tells you that you are at risk for this type of infection.  Your sexual activity has changed since you were last screened and you are at an increased risk for chlamydia or gonorrhea. Ask your health care provider if you are at risk.  If you are at risk of being infected with HIV, it is recommended that you take a prescription medicine daily to  prevent HIV infection. This is called preexposure prophylaxis (PrEP). You are considered at risk if:  You are a heterosexual woman, are sexually active, and are at increased risk for HIV infection.  You take drugs by injection.  You are sexually active with a partner who has HIV.  Talk with your health care provider about whether you are at high risk of being infected with HIV. If you choose to begin PrEP, you should first be tested for HIV. You should then be tested every 3 months for as long as you are taking PrEP.  Osteoporosis is a disease in which the bones lose minerals and strength with aging. This can result in serious bone fractures or breaks. The risk of osteoporosis can be identified using a bone density scan. Women ages 70 years and over and women at risk for fractures or osteoporosis should discuss screening with their health care providers. Ask your health care provider whether you should take a calcium supplement or vitamin D to reduce the rate of osteoporosis.  Menopause can be associated with physical symptoms and risks. Hormone replacement therapy is available to decrease symptoms and risks. You should talk to your health care provider about whether hormone replacement therapy is right for you.  Use sunscreen. Apply sunscreen liberally and repeatedly throughout the day. You should seek shade when your  shadow is shorter than you. Protect yourself by wearing long sleeves, pants, a wide-brimmed hat, and sunglasses year round, whenever you are outdoors.  Once a month, do a whole body skin exam, using a mirror to look at the skin on your back. Tell your health care provider of new moles, moles that have irregular borders, moles that are larger than a pencil eraser, or moles that have changed in shape or color.  Stay current with required vaccines (immunizations).  Influenza vaccine. All adults should be immunized every year.  Tetanus, diphtheria, and acellular pertussis (Td, Tdap) vaccine. Pregnant women should receive 1 dose of Tdap vaccine during each pregnancy. The dose should be obtained regardless of the length of time since the last dose. Immunization is preferred during the 27th-36th week of gestation. An adult who has not previously received Tdap or who does not know her vaccine status should receive 1 dose of Tdap. This initial dose should be followed by tetanus and diphtheria toxoids (Td) booster doses every 10 years. Adults with an unknown or incomplete history of completing a 3-dose immunization series with Td-containing vaccines should begin or complete a primary immunization series including a Tdap dose. Adults should receive a Td booster every 10 years.  Varicella vaccine. An adult without evidence of immunity to varicella should receive 2 doses or a second dose if she has previously received 1 dose. Pregnant females who do not have evidence of immunity should receive the first dose after pregnancy. This first dose should be obtained before leaving the health care facility. The second dose should be obtained 4-8 weeks after the first dose.  Human papillomavirus (HPV) vaccine. Females aged 13-26 years who have not received the vaccine previously should obtain the 3-dose series. The vaccine is not recommended for use in pregnant females. However, pregnancy testing is not needed before  receiving a dose. If a female is found to be pregnant after receiving a dose, no treatment is needed. In that case, the remaining doses should be delayed until after the pregnancy. Immunization is recommended for any person with an immunocompromised condition through the age of 96 years if she did not  get any or all doses earlier. During the 3-dose series, the second dose should be obtained 4-8 weeks after the first dose. The third dose should be obtained 24 weeks after the first dose and 16 weeks after the second dose.  Zoster vaccine. One dose is recommended for adults aged 22 years or older unless certain conditions are present.  Measles, mumps, and rubella (MMR) vaccine. Adults born before [birth year] generally are considered immune to measles and mumps. Adults born in [birth year] or later should have 1 or more doses of MMR vaccine unless there is a contraindication to the vaccine or there is laboratory evidence of immunity to each of the three diseases. A routine second dose of MMR vaccine should be obtained at least 28 days after the first dose for students attending postsecondary schools, health care workers, or international travelers. People who received inactivated measles vaccine or an unknown type of measles vaccine during 1963-1967 should receive 2 doses of MMR vaccine. People who received inactivated mumps vaccine or an unknown type of mumps vaccine before 1979 and are at high risk for mumps infection should consider immunization with 2 doses of MMR vaccine. For females of childbearing age, rubella immunity should be determined. If there is no evidence of immunity, females who are not pregnant should be vaccinated. If there is no evidence of immunity, females who are pregnant should delay immunization until after pregnancy. Unvaccinated health care workers born before [birth year] who lack laboratory evidence of measles, mumps, or rubella immunity or laboratory confirmation of disease should consider measles and mumps  immunization with 2 doses of MMR vaccine or rubella immunization with 1 dose of MMR vaccine.  Pneumococcal 13-valent conjugate (PCV13) vaccine. When indicated, a person who is uncertain of her immunization history and has no record of immunization should receive the PCV13 vaccine. An adult aged 70 years or older who has certain medical conditions and has not been previously immunized should receive 1 dose of PCV13 vaccine. This PCV13 should be followed with a dose of pneumococcal polysaccharide (PPSV23) vaccine. The PPSV23 vaccine dose should be obtained at least 8 weeks after the dose of PCV13 vaccine. An adult aged 73 years or older who has certain medical conditions and previously received 1 or more doses of PPSV23 vaccine should receive 1 dose of PCV13. The PCV13 vaccine dose should be obtained 1 or more years after the last PPSV23 vaccine dose.  Pneumococcal polysaccharide (PPSV23) vaccine. When PCV13 is also indicated, PCV13 should be obtained first. All adults aged 78 years and older should be immunized. An adult younger than age 68 years who has certain medical conditions should be immunized. Any person who resides in a nursing home or long-term care facility should be immunized. An adult smoker should be immunized. People with an immunocompromised condition and certain other conditions should receive both PCV13 and PPSV23 vaccines. People with human immunodeficiency virus (HIV) infection should be immunized as soon as possible after diagnosis. Immunization during chemotherapy or radiation therapy should be avoided. Routine use of PPSV23 vaccine is not recommended for American Indians, [place] Natives, or people younger than 65 years unless there are medical conditions that require PPSV23 vaccine. When indicated, people who have unknown immunization and have no record of immunization should receive PPSV23 vaccine. One-time revaccination 5 years after the first dose of PPSV23 is recommended for people aged  19-64 years who have chronic kidney failure, nephrotic syndrome, asplenia, or immunocompromised conditions. People who received 1-2 doses of PPSV23 before age 47 years should  receive another dose of PPSV23 vaccine at age 46 years or later if at least 5 years have passed since the previous dose. Doses of PPSV23 are not needed for people immunized with PPSV23 at or after age 33 years.  Meningococcal vaccine. Adults with asplenia or persistent complement component deficiencies should receive 2 doses of quadrivalent meningococcal conjugate (MenACWY-D) vaccine. The doses should be obtained at least 2 months apart. Microbiologists working with certain meningococcal bacteria, [employer] recruits, people at risk during an outbreak, and people who travel to or live in countries with a high rate of meningitis should be immunized. A first-year college student up through age 22 years who is living in a residence hall should receive a dose if she did not receive a dose on or after her 16th birthday. Adults who have certain high-risk conditions should receive one or more doses of vaccine.  Hepatitis A vaccine. Adults who wish to be protected from this disease, have certain high-risk conditions, work with hepatitis A-infected animals, work in hepatitis A research labs, or travel to or work in countries with a high rate of hepatitis A should be immunized. Adults who were previously unvaccinated and who anticipate close contact with an international adoptee during the first 60 days after arrival in the [place] from a country with a high rate of hepatitis A should be immunized.  Hepatitis B vaccine. Adults who wish to be protected from this disease, have certain high-risk conditions, may be exposed to blood or other infectious body fluids, are household contacts or sex partners of hepatitis B positive people, are clients or workers in certain care facilities, or travel to or work in countries with a high rate of hepatitis B  should be immunized.  Haemophilus influenzae type b (Hib) vaccine. A previously unvaccinated person with asplenia or sickle cell disease or having a scheduled splenectomy should receive 1 dose of Hib vaccine. Regardless of previous immunization, a recipient of a hematopoietic stem cell transplant should receive a 3-dose series 6-12 months after her successful transplant. Hib vaccine is not recommended for adults with HIV infection. Preventive Services / Frequency Ages 16 to 65 years  Blood pressure check.** / Every 1 to 2 years.  Lipid and cholesterol check.** / Every 5 years beginning at age 43.  Clinical breast exam.** / Every 3 years for women in their 3s and 34s.  BRCA-related cancer risk assessment.** / For women who have family members with a BRCA-related cancer (breast, ovarian, tubal, or peritoneal cancers).  Pap test.** / Every 2 years from ages 5 through 67. Every 3 years starting at age 87 through age 37 or 32 with a history of 3 consecutive normal Pap tests.  HPV screening.** / Every 3 years from ages 7 through ages 34 to 44 with a history of 3 consecutive normal Pap tests.  Hepatitis C blood test.** / For any individual with known risks for hepatitis C.  Skin self-exam. / Monthly.  Influenza vaccine. / Every year.  Tetanus, diphtheria, and acellular pertussis (Tdap, Td) vaccine.** / Consult your health care provider. Pregnant women should receive 1 dose of Tdap vaccine during each pregnancy. 1 dose of Td every 10 years.  Varicella vaccine.** / Consult your health care provider. Pregnant females who do not have evidence of immunity should receive the first dose after pregnancy.  HPV vaccine. / 3 doses over 6 months, if 64 and younger. The vaccine is not recommended for use in pregnant females. However, pregnancy testing is not needed before  receiving a dose.  Measles, mumps, rubella (MMR) vaccine.** / You need at least 1 dose of MMR if you were born in 1957 or later. You  may also need a 2nd dose. For females of childbearing age, rubella immunity should be determined. If there is no evidence of immunity, females who are not pregnant should be vaccinated. If there is no evidence of immunity, females who are pregnant should delay immunization until after pregnancy.  Pneumococcal 13-valent conjugate (PCV13) vaccine.** / Consult your health care provider.  Pneumococcal polysaccharide (PPSV23) vaccine.** / 1 to 2 doses if you smoke cigarettes or if you have certain conditions.  Meningococcal vaccine.** / 1 dose if you are age 56 to 27 years and a Market researcher living in a residence hall, or have one of several medical conditions, you need to get vaccinated against meningococcal disease. You may also need additional booster doses.  Hepatitis A vaccine.** / Consult your health care provider.  Hepatitis B vaccine.** / Consult your health care provider.  Haemophilus influenzae type b (Hib) vaccine.** / Consult your health care provider. Ages 76 to 67 years  Blood pressure check.** / Every 1 to 2 years.  Lipid and cholesterol check.** / Every 5 years beginning at age 63 years.  Lung cancer screening. / Every year if you are aged 84-80 years and have a 30-pack-year history of smoking and currently smoke or have quit within the past 15 years. Yearly screening is stopped once you have quit smoking for at least 15 years or develop a health problem that would prevent you from having lung cancer treatment.  Clinical breast exam.** / Every year after age 59 years.  BRCA-related cancer risk assessment.** / For women who have family members with a BRCA-related cancer (breast, ovarian, tubal, or peritoneal cancers).  Mammogram.** / Every year beginning at age 60 years and continuing for as long as you are in good health. Consult with your health care provider.  Pap test.** / Every 3 years starting at age 34 years through age 26 or 2 years with a history of 3  consecutive normal Pap tests.  HPV screening.** / Every 3 years from ages 60 years through ages 67 to 14 years with a history of 3 consecutive normal Pap tests.  Fecal occult blood test (FOBT) of stool. / Every year beginning at age 92 years and continuing until age 76 years. You may not need to do this test if you get a colonoscopy every 10 years.  Flexible sigmoidoscopy or colonoscopy.** / Every 5 years for a flexible sigmoidoscopy or every 10 years for a colonoscopy beginning at age 53 years and continuing until age 23 years.  Hepatitis C blood test.** / For all people born from [birth year] through 1966 and any individual with known risks for hepatitis C.  Skin self-exam. / Monthly.  Influenza vaccine. / Every year.  Tetanus, diphtheria, and acellular pertussis (Tdap/Td) vaccine.** / Consult your health care provider. Pregnant women should receive 1 dose of Tdap vaccine during each pregnancy. 1 dose of Td every 10 years.  Varicella vaccine.** / Consult your health care provider. Pregnant females who do not have evidence of immunity should receive the first dose after pregnancy.  Zoster vaccine.** / 1 dose for adults aged 77 years or older.  Measles, mumps, rubella (MMR) vaccine.** / You need at least 1 dose of MMR if you were born in 1957 or later. You may also need a 2nd dose. For females of childbearing age, rubella immunity  should be determined. If there is no evidence of immunity, females who are not pregnant should be vaccinated. If there is no evidence of immunity, females who are pregnant should delay immunization until after pregnancy.  Pneumococcal 13-valent conjugate (PCV13) vaccine.** / Consult your health care provider.  Pneumococcal polysaccharide (PPSV23) vaccine.** / 1 to 2 doses if you smoke cigarettes or if you have certain conditions.  Meningococcal vaccine.** / Consult your health care provider.  Hepatitis A vaccine.** / Consult your health care provider.  Hepatitis B  vaccine.** / Consult your health care provider.  Haemophilus influenzae type b (Hib) vaccine.** / Consult your health care provider. Ages 21 years and over  Blood pressure check.** / Every 1 to 2 years.  Lipid and cholesterol check.** / Every 5 years beginning at age 11 years.  Lung cancer screening. / Every year if you are aged 67-80 years and have a 30-pack-year history of smoking and currently smoke or have quit within the past 15 years. Yearly screening is stopped once you have quit smoking for at least 15 years or develop a health problem that would prevent you from having lung cancer treatment.  Clinical breast exam.** / Every year after age 55 years.  BRCA-related cancer risk assessment.** / For women who have family members with a BRCA-related cancer (breast, ovarian, tubal, or peritoneal cancers).  Mammogram.** / Every year beginning at age 8 years and continuing for as long as you are in good health. Consult with your health care provider.  Pap test.** / Every 3 years starting at age 76 years through age 41 or 29 years with 3 consecutive normal Pap tests. Testing can be stopped between 65 and 70 years with 3 consecutive normal Pap tests and no abnormal Pap or HPV tests in the past 10 years.  HPV screening.** / Every 3 years from ages 54 years through ages 70 or 110 years with a history of 3 consecutive normal Pap tests. Testing can be stopped between 65 and 70 years with 3 consecutive normal Pap tests and no abnormal Pap or HPV tests in the past 10 years.  Fecal occult blood test (FOBT) of stool. / Every year beginning at age 33 years and continuing until age 85 years. You may not need to do this test if you get a colonoscopy every 10 years.  Flexible sigmoidoscopy or colonoscopy.** / Every 5 years for a flexible sigmoidoscopy or every 10 years for a colonoscopy beginning at age 100 years and continuing until age 19 years.  Hepatitis C blood test.** / For all people born from [birth year]  through 1965 and any individual with known risks for hepatitis C.  Osteoporosis screening.** / A one-time screening for women ages 17 years and over and women at risk for fractures or osteoporosis.  Skin self-exam. / Monthly.  Influenza vaccine. / Every year.  Tetanus, diphtheria, and acellular pertussis (Tdap/Td) vaccine.** / 1 dose of Td every 10 years.  Varicella vaccine.** / Consult your health care provider.  Zoster vaccine.** / 1 dose for adults aged 67 years or older.  Pneumococcal 13-valent conjugate (PCV13) vaccine.** / Consult your health care provider.  Pneumococcal polysaccharide (PPSV23) vaccine.** / 1 dose for all adults aged 1 years and older.  Meningococcal vaccine.** / Consult your health care provider.  Hepatitis A vaccine.** / Consult your health care provider.  Hepatitis B vaccine.** / Consult your health care provider.  Haemophilus influenzae type b (Hib) vaccine.** / Consult your health care provider. ** Family history and  personal history of risk and conditions may change your health care provider's recommendations. Document Released: 09/03/2001 Document Revised: 11/22/2013 Document Reviewed: 12/03/2010 [facility] Patient Information 2015 [place], [place]. This information is not intended to replace advice given to you by your health care provider. Make sure you discuss any questions you have with your health care provider.

## 2015-02-14 NOTE — Progress Notes (Signed)
Pre visit review using our clinic review tool, if applicable. No additional management support is needed unless otherwise documented below in the visit note. 

## 2015-02-14 NOTE — Assessment & Plan Note (Signed)
EKG with NSR. Will check fasting lipid panel today. BP controlled.

## 2015-02-14 NOTE — Assessment & Plan Note (Signed)
Reassurance given. Will gradually fade with skin cell turnover. Keep skin moisturized.

## 2015-02-17 ENCOUNTER — Telehealth: Payer: Self-pay | Admitting: Physician Assistant

## 2015-02-17 DIAGNOSIS — R8290 Unspecified abnormal findings in urine: Secondary | ICD-10-CM

## 2015-02-17 NOTE — Telephone Encounter (Signed)
-----   Message from Brunetta Jeans, PA-C sent at 02/15/2015  7:08 AM EDT ----- Physical blood work looks great!  Cholesterol great with LDL (bad) cholesterol < 100 which is fantastic. Urine with some abnormal findings. Would like her to stay hydrated and return to lab in 1 week for repeat UA.

## 2015-02-17 NOTE — Telephone Encounter (Signed)
PtAmit Mendoza  Phone: [phone] Cell: [phone] Pt returning your call.

## 2015-02-17 NOTE — Addendum Note (Signed)
Addended by: Harl Bowie on: 02/17/2015 11:02 AM   Modules accepted: Orders

## 2015-02-17 NOTE — Telephone Encounter (Addendum)
Called and spoke with the pt and informed her of recent lab results and note.  Pt verbalized understanding and agreed.  Pt was scheduled a lab appt for repeat UA on Tues. (02/21/15 @ 1:15pm).  Future lab ordered and sent.//AB/CMA

## 2015-02-21 ENCOUNTER — Other Ambulatory Visit (INDEPENDENT_AMBULATORY_CARE_PROVIDER_SITE_OTHER): Payer: BLUE CROSS/BLUE SHIELD

## 2015-02-21 DIAGNOSIS — R8290 Unspecified abnormal findings in urine: Secondary | ICD-10-CM

## 2015-02-22 ENCOUNTER — Encounter: Payer: Self-pay | Admitting: Family

## 2015-02-22 LAB — URINALYSIS, ROUTINE W REFLEX MICROSCOPIC
Bilirubin Urine: NEGATIVE
Hgb urine dipstick: NEGATIVE
Ketones, ur: NEGATIVE
Nitrite: NEGATIVE
Specific Gravity, Urine: 1.015 (ref 1.000–1.030)
Total Protein, Urine: NEGATIVE
Urine Glucose: NEGATIVE
Urobilinogen, UA: 0.2 (ref 0.0–1.0)
pH: 6 (ref 5.0–8.0)

## 2015-03-15 ENCOUNTER — Encounter: Payer: Self-pay | Admitting: Physician Assistant

## 2015-03-15 ENCOUNTER — Telehealth: Payer: Self-pay | Admitting: Physician Assistant

## 2015-03-15 NOTE — Telephone Encounter (Signed)
°  Relation to EM:VVKP Call back number:[phone] Pharmacy:  Reason for call: pt was seen on 02/14/15 for her CPE, pt states she received one  bill with two line itemsfor one visit and does not understand why and would like to know how it was coded. Pt states that she was ask if she had any other health concerns during her CPE so she answered the question. Pt feels that it was a trick question because if we know that if you bring up other health concerns you will be billed again, but the pt does not know that she doesn't feel that is right. Would like to speak with you to see how it was coded and what she can do to get it corrected.

## 2015-03-15 NOTE — Telephone Encounter (Signed)
Separate charge billed for discussion of weight including TSH, T4 labs drawn to further assess. I do not trick my patients into telling me concerns as I do not gain anything from addressing more concerns at visit. I ask to assess their health concerns. If she would have came in for a separate visit to discuss that issues then she would have been charged at that time plus would have had to pay an additional co-pay. I am forwarding this to the manager for review so that she can call the patient and address how she sees fit.

## 2015-04-28 NOTE — Telephone Encounter (Signed)
Left message for patient to return my call to discuss.

## 2015-05-02 NOTE — Telephone Encounter (Signed)
Discussed billing and coding with the patient and she understands the bill. She is upset that she was not told upfront that there could be other charges at the same time as her physical, insured patient I would pass along the feedback to our providers.

## 2015-09-13 ENCOUNTER — Telehealth: Payer: Self-pay | Admitting: Physician Assistant

## 2015-09-13 NOTE — Telephone Encounter (Signed)
LM for pt to call and schedule flu shot or update records. °

## 2015-12-10 ENCOUNTER — Encounter: Payer: Self-pay | Admitting: Emergency Medicine

## 2015-12-10 ENCOUNTER — Emergency Department (INDEPENDENT_AMBULATORY_CARE_PROVIDER_SITE_OTHER)
Admission: EM | Admit: 2015-12-10 | Discharge: 2015-12-10 | Disposition: A | Discharge status: Home / Self Care | Payer: BLUE CROSS/BLUE SHIELD | Source: Home / Self Care | Attending: Family Medicine | Admitting: Family Medicine

## 2015-12-10 ENCOUNTER — Ambulatory Visit (HOSPITAL_BASED_OUTPATIENT_CLINIC_OR_DEPARTMENT_OTHER)
Admission: RE | Admit: 2015-12-10 | Discharge: 2015-12-10 | Disposition: A | Discharge status: Home / Self Care | Payer: BLUE CROSS/BLUE SHIELD | Source: Ambulatory Visit | Attending: Family Medicine | Admitting: Family Medicine

## 2015-12-10 DIAGNOSIS — M79622 Pain in left upper arm: Secondary | ICD-10-CM | POA: Diagnosis not present

## 2015-12-10 DIAGNOSIS — M79602 Pain in left arm: Secondary | ICD-10-CM | POA: Diagnosis not present

## 2015-12-10 NOTE — ED Provider Notes (Signed)
CSN: [id number]     Arrival date & time 12/10/15  1122 History   First MD Initiated Contact with Patient 12/10/15 1145     Chief Complaint  Patient presents with  . Extremity Pain   (Consider location/radiation/quality/duration/timing/severity/associated sxs/prior Treatment) HPI  The pt is a 45yo female presenting to [facility] with c/o Left upper arm pain that is aching and sore.  Pain is mild in severity but feels similar to the soreness she had when she had a blood clot in her leg 2 years ago.  Pt was placed on Xarelto for 3 months at that time then taken off and switched to 81mg  aspirin daily.  She admits to not taking aspirin regularly anymore but has been taking daily for about 1 week since onset of soreness.  She was seen by a nurse at her work who advised her it was likely a muscle strain but pain has not improved so she wants to make sure she does not have a clot.  Denies known injury. Denies chest pain or SOB.   Past Medical History  Diagnosis Date  . Venous thrombosis of leg     Left  . History of chicken pox    Past Surgical History  Procedure Laterality Date  . Ruptured disc    . Cholecystectomy    . Tonsillectomy    . Breast surgery      Fibroidadenoma-Left  . Wisdom tooth extraction    . Refractive surgery      Bilateral   Family History  Problem Relation Age of Onset  . Brain cancer Mother 47    Deceased-Tumor  . Glaucoma Maternal Grandfather   . Glaucoma Maternal Aunt   . Cancer Maternal Grandmother   . Lung cancer Maternal Aunt   . Healthy Sister     x1   Social History  Substance Use Topics  . Smoking status: Never Smoker   . Smokeless tobacco: Never Used  . Alcohol Use: 0.0 oz/week    0 Standard drinks or equivalent per week     Comment: rare   OB History    No data available     Review of Systems  Respiratory: Negative for chest tightness, shortness of breath and wheezing.   Cardiovascular: Negative for chest pain and palpitations.  Musculoskeletal:  Positive for myalgias. Negative for joint swelling and arthralgias.  Skin: Negative for color change and wound.  Neurological: Negative for weakness and numbness.    Allergies  Amoxil  Home Medications   Prior to Admission medications   Medication Sig Start Date End Date Taking? Authorizing Provider  aspirin 81 MG tablet Take 81 mg by mouth daily.    Historical Provider, MD  CAMILA 0.35 MG tablet Take 1 tablet by mouth daily. 01/04/15   Historical Provider, MD  Multiple Vitamin (MULTIVITAMIN) tablet Take 1 tablet by mouth as directed.    Historical Provider, MD  norethindrone-ethinyl estradiol (MICROGESTIN) 1-20 MG-MCG tablet Take 1 tablet by mouth daily. Patient not taking: Reported on 02/14/2015 09/20/14   Brunetta Jeans, PA-C   Meds Ordered and Administered this Visit  Medications - No data to display  BP 126/82 mmHg  Pulse 61  Temp(Src) 98.1 F (36.7 C) (Oral)  Resp 16  Ht 5\' 2"  (1.575 m)  Wt 212 lb (96.163 kg)  BMI 38.77 kg/m2  SpO2 99% No data found.   Physical Exam  Constitutional: She is oriented to person, place, and time. She appears well-developed and well-nourished.  HENT:  Head:  Normocephalic and atraumatic.  Eyes: EOM are normal.  Neck: Normal range of motion.  Cardiovascular: Normal rate.   Pulses:      Radial pulses are 2+ on the left side.  Pulmonary/Chest: Effort normal.  Musculoskeletal: Normal range of motion. She exhibits tenderness. She exhibits no edema.  Left upper arm: mild tenderness over bicep with 1-2cm area of muscle spasm vs tender nodule.  No obvious edema. Full ROM shoulder and elbow w/o pain. No crepitus.  Neurological: She is alert and oriented to person, place, and time.  Left arm: normal sensation  Skin: Skin is warm and dry. No erythema.  Left upper arm: no erythema, warmth, or ecchymosis.  Psychiatric: She has a normal mood and affect. Her behavior is normal.  Nursing note and vitals reviewed.   ED Course  Procedures (including  critical care time)  Labs Review Labs Reviewed - No data to display  Imaging Review US Venous Img Upper Uni Left  12/10/2015  CLINICAL DATA:  45 year old currently taking oral contraceptives, prior history of left lower extremity DVT, presenting with 1 week history of soreness and aching involving the left upper extremity. EXAM: LEFT UPPER EXTREMITY VENOUS DOPPLER ULTRASOUND TECHNIQUE: Gray-scale sonography with graded compression, as well as color Doppler and duplex ultrasound were performed to evaluate the upper extremity deep venous system from the level of the subclavian vein and including the jugular, axillary, basilic, radial, ulnar and upper cephalic vein. Spectral Doppler was utilized to evaluate flow at rest and with distal augmentation maneuvers. COMPARISON:  None. FINDINGS: Contralateral Subclavian Vein: Respiratory phasicity is normal and symmetric with the symptomatic side. No evidence of thrombus. Normal compressibility. Internal Jugular Vein: No evidence of thrombus. Normal compressibility, respiratory phasicity and response to augmentation. Subclavian Vein: No evidence of thrombus. Normal compressibility, respiratory phasicity and response to augmentation. Axillary Vein: No evidence of thrombus. Normal compressibility, respiratory phasicity and response to augmentation. Cephalic Vein: No evidence of thrombus. Normal compressibility, respiratory phasicity and response to augmentation. Basilic Vein: No evidence of thrombus. Normal compressibility, respiratory phasicity and response to augmentation. Brachial Veins: No evidence of thrombus. Normal compressibility, respiratory phasicity and response to augmentation. Radial Veins: Diminutive veins which are visualized in the proximal portion of the forearm. No evidence of thrombus. Normal compressibility, respiratory phasicity and response to augmentation. Ulnar Veins: Diminutive veins which are visualized in the proximal portion of forearm. No  evidence of thrombus. Normal compressibility, respiratory phasicity and response to augmentation. Venous Reflux:  Not evaluated. Other Findings:  None visualized. IMPRESSION: No evidence of left upper extremity DVT. Electronically Signed   By: Evangeline Dakin M.D.   On: 12/10/2015 15:52     MDM   1. Left upper arm pain    Pt c/o Left upper arm soreness c/w soreness when she had a blood clot in her leg 2 years ago. No known injury. No evidence of underlying infection. PMS in tact  Pt sent to [facility] for U/S.  U/S: no evidence of DVT in LUE. Discussed results with pt over the phone. Reassured pt no evidence of blood clot, however, encouraged to f/u with PCP in 1 week if pain not improving, or go to emergency department/call 911 if she develops chest pain or SOB. May alternate ice and heat and take acetaminophen and ibuprofen for pain Patient verbalized understanding and agreement with treatment plan.      Noland Fordyce, PA-C 12/10/15 1605

## 2015-12-10 NOTE — ED Notes (Signed)
Patient presents to [facility] with C/O aching and soreness in the left upper arm, time one week. History of blood clot in calf in 2015. Patient states it feel like the same type of pain as that was in 2015. Patient states no pain just feels achy and sore denies numbness,

## 2015-12-15 ENCOUNTER — Encounter: Payer: Self-pay | Admitting: Physician Assistant

## 2015-12-15 ENCOUNTER — Ambulatory Visit (INDEPENDENT_AMBULATORY_CARE_PROVIDER_SITE_OTHER): Payer: BLUE CROSS/BLUE SHIELD | Admitting: Physician Assistant

## 2015-12-15 VITALS — BP 128/80 | HR 56 | Temp 98.2°F | Ht 62.0 in | Wt 211.4 lb

## 2015-12-15 DIAGNOSIS — M25512 Pain in left shoulder: Secondary | ICD-10-CM

## 2015-12-15 MED ORDER — MELOXICAM 15 MG PO TABS: 15.0000 mg | ORAL_TABLET | Freq: Every day | ORAL | Status: DC

## 2015-12-15 NOTE — Patient Instructions (Addendum)
Please take the Mobic once daily as directed with food. Avoid heavy lifting or overexertion.  Ok to resume cardio.  If you notice any worsening symptoms or if they are not resolving, we will need imaging.

## 2015-12-15 NOTE — Progress Notes (Signed)
Pre visit review using our clinic review tool, if applicable. No additional management support is needed unless otherwise documented below in the visit note. 

## 2015-12-15 NOTE — Progress Notes (Signed)
Patient presents to clinic today c/o pain in left shoulder described as aching and worse with movement of shoulder. Pain has been present for 1.5 weeks. Was seen at [facility] and told pain was related to muscle strain. Instructed to follow-up with PCP if not resolved within 1 week. Patient denies neck pain. Denies trauma or injury. Denies bruising. Denies weakness, numbness or tingling of extremity. Had [place] upper extremity giving history of DVT of LLE. [place] negative for clot.  Past Medical History  Diagnosis Date  . Venous thrombosis of leg     Left  . History of chicken pox     Current Outpatient Prescriptions on File Prior to Visit  Medication Sig Dispense Refill  . aspirin 81 MG tablet Take 81 mg by mouth daily.    Marland Kitchen CAMILA 0.35 MG tablet Take 1 tablet by mouth daily.  3  . Multiple Vitamin (MULTIVITAMIN) tablet Take 1 tablet by mouth as directed.    . norethindrone-ethinyl estradiol (MICROGESTIN) 1-20 MG-MCG tablet Take 1 tablet by mouth daily. (Patient not taking: Reported on 02/14/2015) 3 Package 0   No current facility-administered medications on file prior to visit.    Allergies  Allergen Reactions  . Amoxil [Amoxicillin] Hives    Family History  Problem Relation Age of Onset  . Brain cancer Mother 75    Deceased-Tumor  . Glaucoma Maternal Grandfather   . Glaucoma Maternal Aunt   . Cancer Maternal Grandmother   . Lung cancer Maternal Aunt   . Healthy Sister     x1    Social History   Social History  . Marital Status: Single    Spouse Name: N/A  . Number of Children: N/A  . Years of Education: N/A   Social History Main Topics  . Smoking status: Never Smoker   . Smokeless tobacco: Never Used  . Alcohol Use: 0.0 oz/week    0 Standard drinks or equivalent per week     Comment: rare  . Drug Use: No  . Sexual Activity:    Partners: Male     Comment: boyfriend   Other Topics Concern  . None   Social History Narrative   Review of Systems - See HPI.  All other ROS  are negative.  BP 128/80 mmHg  Pulse 56  Temp(Src) 98.2 F (36.8 C) (Oral)  Ht 5\' 2"  (1.575 m)  Wt 211 lb 6 oz (95.879 kg)  BMI 38.65 kg/m2  SpO2 98%  Physical Exam  Constitutional: She is oriented to person, place, and time and well-developed, well-nourished, and in no distress.  HENT:  Head: Normocephalic and atraumatic.  Eyes: Conjunctivae are normal.  Cardiovascular: Normal rate, regular rhythm, normal heart sounds and intact distal pulses.   Pulmonary/Chest: Effort normal and breath sounds normal. No respiratory distress. She has no wheezes. She has no rales. She exhibits no tenderness.  Musculoskeletal:       Left shoulder: She exhibits tenderness. She exhibits normal range of motion, no bony tenderness, no spasm, normal pulse and normal strength.       Cervical back: Normal.  Neurological: She is alert and oriented to person, place, and time.  Skin: Skin is warm and dry. No rash noted.  Vitals reviewed.   No results found for this or any previous visit (from the past 2160 hour(s)).  Assessment/Plan: 1. Left shoulder pain [place] negative for DVT. Agree that muscle strain is likely culprit as pain is with motion. No signs of impingement or rotator cuff tear  on exam. Rx Mobic once daily. Tylenol for breakthrough pain. Supportive measures reviewed. Discussed typical timeline for symptom resolution. If not resolving, will need further assessment. - meloxicam (MOBIC) 15 MG tablet; Take 1 tablet (15 mg total) by mouth daily.  Dispense: 30 tablet; Refill: 0  Leeanne Rio, [place]

## 2016-01-11 ENCOUNTER — Other Ambulatory Visit: Payer: Self-pay | Admitting: Physician Assistant

## 2016-01-25 ENCOUNTER — Encounter: Payer: Self-pay | Admitting: Physician Assistant

## 2016-02-09 ENCOUNTER — Ambulatory Visit (INDEPENDENT_AMBULATORY_CARE_PROVIDER_SITE_OTHER): Payer: BLUE CROSS/BLUE SHIELD | Admitting: Physician Assistant

## 2016-02-09 ENCOUNTER — Encounter: Payer: Self-pay | Admitting: Physician Assistant

## 2016-02-09 VITALS — BP 118/72 | HR 71 | Temp 98.2°F | Resp 16 | Ht 62.0 in | Wt 208.1 lb

## 2016-02-09 DIAGNOSIS — E669 Obesity, unspecified: Secondary | ICD-10-CM | POA: Diagnosis not present

## 2016-02-09 DIAGNOSIS — S46912A Strain of unspecified muscle, fascia and tendon at shoulder and upper arm level, left arm, initial encounter: Secondary | ICD-10-CM | POA: Diagnosis not present

## 2016-02-09 DIAGNOSIS — B372 Candidiasis of skin and nail: Secondary | ICD-10-CM

## 2016-02-09 MED ORDER — CYCLOBENZAPRINE HCL 10 MG PO TABS: 10.0000 mg | ORAL_TABLET | Freq: Every day | ORAL | Status: DC

## 2016-02-09 MED ORDER — PHENTERMINE HCL 37.5 MG PO CAPS: 37.5000 mg | ORAL_CAPSULE | ORAL | Status: DC

## 2016-02-09 MED ORDER — KETOCONAZOLE 2 % EX CREA: 1.0000 "application " | TOPICAL_CREAM | Freq: Two times a day (BID) | CUTANEOUS | Status: DC

## 2016-02-09 NOTE — Progress Notes (Signed)
Patient presents to clinic today with several complaints.   Patient endorses redness and flaking of skin in inguinal folds bilaterally x 2-3 weeks. Has been trying to powder the area to keep it dry. Denies similar symptoms elsewhere.  Patient endorses L shoulder pain described as aching and in the shoulder blade and lateral shoulder. Denies trauma or injury. Denies decreased ROM. Denies numbness, weakness or tingling of extremity. Denies neck pain. Has not taken anything for symptoms.   Patient would like to discuss medication to help with weight loss. Body mass index is 38.06 kg/(m^2). Is currently eating a very well-balanced diet, monitoring portion sizes and exercising daily. Denies history of thyroid dysfunction. Previous TSH testing within normal limits.  Past Medical History  Diagnosis Date  . Venous thrombosis of leg     Left  . History of chicken pox     Current Outpatient Prescriptions on File Prior to Visit  Medication Sig Dispense Refill  . aspirin 81 MG tablet Take 81 mg by mouth daily.    Marland Kitchen CAMILA 0.35 MG tablet Take 1 tablet by mouth daily.  3  . Multiple Vitamin (MULTIVITAMIN) tablet Take 1 tablet by mouth as directed.     No current facility-administered medications on file prior to visit.    Allergies  Allergen Reactions  . Amoxil [Amoxicillin] Hives    Family History  Problem Relation Age of Onset  . Brain cancer Mother 65    Deceased-Tumor  . Glaucoma Maternal Grandfather   . Glaucoma Maternal Aunt   . Cancer Maternal Grandmother   . Lung cancer Maternal Aunt   . Healthy Sister     x1    Social History   Social History  . Marital Status: Single    Spouse Name: N/A  . Number of Children: N/A  . Years of Education: N/A   Social History Main Topics  . Smoking status: Never Smoker   . Smokeless tobacco: Never Used  . Alcohol Use: 0.0 oz/week    0 Standard drinks or equivalent per week     Comment: rare  . Drug Use: No  . Sexual Activity:    Partners: Male     Comment: boyfriend   Other Topics Concern  . None   Social History Narrative   Review of Systems - See HPI.  All other ROS are negative.  BP 118/72 mmHg  Pulse 71  Temp(Src) 98.2 F (36.8 C) (Oral)  Resp 16  Ht 5\' 2"  (1.575 m)  Wt 208 lb 2 oz (94.405 kg)  BMI 38.06 kg/m2  SpO2 98%  Physical Exam  Constitutional: She is oriented to person, place, and time and well-developed, well-nourished, and in no distress.  HENT:  Head: Normocephalic and atraumatic.  Eyes: Conjunctivae are normal.  Cardiovascular: Normal rate, regular rhythm, normal heart sounds and intact distal pulses.   Pulmonary/Chest: Effort normal and breath sounds normal. No respiratory distress. She has no wheezes. She has no rales. She exhibits no tenderness.  Musculoskeletal:       Left shoulder: She exhibits normal range of motion, no tenderness, no pain, no spasm and normal strength.  Neurological: She is alert and oriented to person, place, and time.  Skin: Skin is warm and dry. No rash noted.     Psychiatric: Affect normal.  Vitals reviewed.  Assessment/Plan: 1. Skin yeast infection Will begin topical anti-yeast cream. Supportive measures reviewed. FU if not resolving. - ketoconazole (NIZORAL) 2 % cream; Apply 1 application topically 2 (two)  times daily. For 2 weeks.  Dispense: 15 g; Refill: 0  2. Left shoulder strain, initial encounter Suspect OA. May be some muscle tension of subscapularis at site of pain. OTC pain medications discussed. Rx Flexeril. Discussed potential need for Sports Medicine assessment. - cyclobenzaprine (FLEXERIL) 10 MG tablet; Take 1 tablet (10 mg total) by mouth at bedtime.  Dispense: 30 tablet; Refill: 0  3. Obesity Continue good diet and exercise regimen. Will begin trial of phentermine. Rx sent. FU 1 month. - phentermine 37.5 MG capsule; Take 1 capsule (37.5 mg total) by mouth every morning.  Dispense: 30 capsule; Refill: 1   Leeanne Rio,  [place]

## 2016-02-09 NOTE — Progress Notes (Signed)
Pre visit review using our clinic review tool, if applicable. No additional management support is needed unless otherwise documented below in the visit note/SLS  

## 2016-02-09 NOTE — Patient Instructions (Signed)
Please take the phentermine daily as directed. Continue diet and exercise.  Follow-up with me in 1 month. If you notice any racing heart on the medication, please stop it and call me.  Start the Flexeril in the evening to help with shoulder tension. Apply heating pad or Icy Hot to the area.  Apply the cream to the affected area twice daily for 2 weeks then once daily as needed. Keep skin and dry. If not improving, please call me.

## 2016-02-12 ENCOUNTER — Telehealth: Payer: Self-pay | Admitting: *Deleted

## 2016-02-12 NOTE — Telephone Encounter (Signed)
Received PA request for Phentermine HCL via fax from [place].   Form filled out and faxed to [facility].  Confirmation received.  Awaiting determination.//AB/CMA

## 2016-02-13 NOTE — Telephone Encounter (Signed)
Received PA approval effective 01/10/2016 through 05/12/2016. PA approval faxed to [facility] on [address]. Approval letter sent for scanning. LMOM informing Pt that insurance has approved medication and pharmacy has been informed. Instructed her to call if questions or concerns.

## 2016-02-15 ENCOUNTER — Encounter: Payer: Self-pay | Admitting: Physician Assistant

## 2016-02-23 ENCOUNTER — Other Ambulatory Visit: Payer: Self-pay | Admitting: Family

## 2016-02-23 DIAGNOSIS — B372 Candidiasis of skin and nail: Secondary | ICD-10-CM

## 2016-02-23 MED ORDER — KETOCONAZOLE 2 % EX CREA: 1.0000 "application " | TOPICAL_CREAM | Freq: Two times a day (BID) | CUTANEOUS | 0 refills | Status: DC

## 2016-03-15 ENCOUNTER — Encounter: Payer: Self-pay | Admitting: Physician Assistant

## 2016-03-15 ENCOUNTER — Ambulatory Visit (INDEPENDENT_AMBULATORY_CARE_PROVIDER_SITE_OTHER): Payer: BLUE CROSS/BLUE SHIELD | Admitting: Physician Assistant

## 2016-03-15 VITALS — BP 125/84 | HR 80 | Temp 97.9°F | Resp 16 | Ht 62.0 in | Wt 202.0 lb

## 2016-03-15 DIAGNOSIS — G8929 Other chronic pain: Secondary | ICD-10-CM | POA: Diagnosis not present

## 2016-03-15 DIAGNOSIS — E669 Obesity, unspecified: Secondary | ICD-10-CM

## 2016-03-15 DIAGNOSIS — M25512 Pain in left shoulder: Secondary | ICD-10-CM

## 2016-03-15 MED ORDER — PHENTERMINE HCL 37.5 MG PO CAPS: 37.5000 mg | ORAL_CAPSULE | ORAL | 1 refills | Status: DC

## 2016-03-15 NOTE — Patient Instructions (Signed)
Please continue phentermine as directed. Follow-up with me in 2 months. Continue diet and exercise.   You will be contacted for assessment by Sports Medicine.

## 2016-03-15 NOTE — Progress Notes (Signed)
   Patient presents to clinic today for follow-up of obesity after starting phentermine 37.5 mg daily. Patient endorses taking medication as directed. Denies noted side effects. Has continued diet and exercise regimen. Has noted a 10 pound weight loss at home.  Patient also endorses continued pain and soreness of left shoulder despite conservative measures. Patient would like to see a sports medicine provider for further assessment.  Past Medical History:  Diagnosis Date  . History of chicken pox   . Venous thrombosis of leg    Left    Current Outpatient Prescriptions on File Prior to Visit  Medication Sig Dispense Refill  . aspirin 81 MG tablet Take 81 mg by mouth daily.    Marland Kitchen CAMILA 0.35 MG tablet Take 1 tablet by mouth daily.  3  . ketoconazole (NIZORAL) 2 % cream Apply 1 application topically 2 (two) times daily. For 2 weeks. 15 g 0  . Multiple Vitamin (MULTIVITAMIN) tablet Take 1 tablet by mouth as directed.     No current facility-administered medications on file prior to visit.     Allergies  Allergen Reactions  . Amoxil [Amoxicillin] Hives    Family History  Problem Relation Age of Onset  . Brain cancer Mother 26    Deceased-Tumor  . Glaucoma Maternal Grandfather   . Glaucoma Maternal Aunt   . Cancer Maternal Grandmother   . Lung cancer Maternal Aunt   . Healthy Sister     x1    Social History   Social History  . Marital status: Single    Spouse name: N/A  . Number of children: N/A  . Years of education: N/A   Social History Main Topics  . Smoking status: Never Smoker  . Smokeless tobacco: Never Used  . Alcohol use 0.0 oz/week     Comment: rare  . Drug use: No  . Sexual activity: Yes    Partners: Male     Comment: boyfriend   Other Topics Concern  . None   Social History Narrative  . None   Review of Systems - See HPI.  All other ROS are negative.  BP 125/84 (BP Location: Right Arm, Patient Position: Sitting, Cuff Size: Large)   Pulse 80    Temp 97.9 F (36.6 C) (Oral)   Resp 16   Ht 5\' 2"  (1.575 m)   Wt 202 lb (91.6 kg)   SpO2 98%   BMI 36.95 kg/m   Physical Exam  Constitutional: She is oriented to person, place, and time and well-developed, well-nourished, and in no distress.  HENT:  Head: Normocephalic and atraumatic.  Cardiovascular: Normal rate, regular rhythm, normal heart sounds and intact distal pulses.   Pulmonary/Chest: Effort normal and breath sounds normal. No respiratory distress. She has no wheezes. She has no rales. She exhibits no tenderness.  Neurological: She is alert and oriented to person, place, and time.  Skin: Skin is warm and dry. No rash noted.  Psychiatric: Affect normal.  Vitals reviewed.   No results found for this or any previous visit (from the past 2160 hour(s)).  Assessment/Plan: Obesity Improvement in weight. Vitals are stable. No noted side effects of phentermine. Will complete a total 42-month course. She is to follow-up at end of 3 months for reassessment.    Leeanne Rio, PA-C

## 2016-03-15 NOTE — Assessment & Plan Note (Signed)
Improvement in weight. Vitals are stable. No noted side effects of phentermine. Will complete a total 61-month course. She is to follow-up at end of 3 months for reassessment.

## 2016-03-19 ENCOUNTER — Ambulatory Visit: Payer: BLUE CROSS/BLUE SHIELD | Admitting: Family Medicine

## 2016-03-21 ENCOUNTER — Ambulatory Visit (INDEPENDENT_AMBULATORY_CARE_PROVIDER_SITE_OTHER): Payer: BLUE CROSS/BLUE SHIELD | Admitting: Family Medicine

## 2016-03-21 ENCOUNTER — Encounter: Payer: Self-pay | Admitting: Family Medicine

## 2016-03-21 ENCOUNTER — Ambulatory Visit (HOSPITAL_BASED_OUTPATIENT_CLINIC_OR_DEPARTMENT_OTHER)
Admission: RE | Admit: 2016-03-21 | Discharge: 2016-03-21 | Disposition: A | Discharge status: Home / Self Care | Payer: BLUE CROSS/BLUE SHIELD | Source: Ambulatory Visit | Attending: Family Medicine | Admitting: Family Medicine

## 2016-03-21 ENCOUNTER — Ambulatory Visit: Payer: BLUE CROSS/BLUE SHIELD | Admitting: Family Medicine

## 2016-03-21 VITALS — BP 114/76 | HR 84 | Ht 62.0 in | Wt 195.0 lb

## 2016-03-21 DIAGNOSIS — M898X1 Other specified disorders of bone, shoulder: Secondary | ICD-10-CM

## 2016-03-21 DIAGNOSIS — M25512 Pain in left shoulder: Secondary | ICD-10-CM | POA: Diagnosis not present

## 2016-03-21 NOTE — Patient Instructions (Signed)
You have spasms/strain of the muscles deep the the shoulder blade. These are frustrating but nothing dangerous. Your exam and x-rays are otherwise normal, reassuring. Consider physical therapy for at least a couple visits to get an extensive home exercise program and stretches to do daily for 6 weeks. Heat as needed for 15 minutes at a time. Medications are unlikely to be beneficial but it's ok to take tylenol or ibuprofen if needed. Follow up with me as needed.

## 2016-03-27 DIAGNOSIS — M25512 Pain in left shoulder: Secondary | ICD-10-CM | POA: Insufficient documentation

## 2016-03-27 NOTE — Assessment & Plan Note (Signed)
independently reviewed radiographs of scapula and no evidence fracture, bony abnormalities or lesions.  Exam is very benign and reassuring.  Consistent with spasms deep to the scapula.  Continue wit heat as needed.  Discussed considering physical therapy but this should resolve over time.  Follow up with [place] as needed.

## 2016-03-27 NOTE — Progress Notes (Signed)
PCP and consultation requested by: Piedad ClimesMartin, William Cody, PA-C  Subjective:   HPI: Patient is a 45 y.o. female here for left shoulder blade pain.  Patient denies known injury or trauma. She reports about 3 months ago she started to develop a dull ache in left shoulder blade. No pain with specific motions, lying down, etc. Does not improve with anything specific. No benefit with meloxicam and flexeril just caused sleepiness. Pain is currently 0/10 in this rea. No tightness. No history of malignancy.  No skin changes, numbness.  Past Medical History:  Diagnosis Date  . History of chicken pox   . Venous thrombosis of leg    Left    Current Outpatient Prescriptions on File Prior to Visit  Medication Sig Dispense Refill  . aspirin 81 MG tablet Take 81 mg by mouth daily.    Marland Kitchen. CAMILA 0.35 MG tablet Take 1 tablet by mouth daily.  3  . ketoconazole (NIZORAL) 2 % cream Apply 1 application topically 2 (two) times daily. For 2 weeks. 15 g 0  . Multiple Vitamin (MULTIVITAMIN) tablet Take 1 tablet by mouth as directed.    . phentermine 37.5 MG capsule Take 1 capsule (37.5 mg total) by mouth every morning. 30 capsule 1   No current facility-administered medications on file prior to visit.     Past Surgical History:  Procedure Laterality Date  . BREAST SURGERY     Fibroidadenoma-Left  . CHOLECYSTECTOMY    . REFRACTIVE SURGERY     Bilateral  . ruptured disc    . TONSILLECTOMY    . WISDOM TOOTH EXTRACTION      Allergies  Allergen Reactions  . Amoxil [Amoxicillin] Hives    Social History   Social History  . Marital status: Single    Spouse name: N/A  . Number of children: N/A  . Years of education: N/A   Occupational History  . Not on file.   Social History Main Topics  . Smoking status: Never Smoker  . Smokeless tobacco: Never Used  . Alcohol use 0.0 oz/week     Comment: rare  . Drug use: No  . Sexual activity: Yes    Partners: Male     Comment: boyfriend   Other  Topics Concern  . Not on file   Social History Narrative  . No narrative on file    Family History  Problem Relation Age of Onset  . Brain cancer Mother 2659    Deceased-Tumor  . Glaucoma Maternal Grandfather   . Glaucoma Maternal Aunt   . Cancer Maternal Grandmother   . Lung cancer Maternal Aunt   . Healthy Sister     x1    BP 114/76   Pulse 84   Ht 5\' 2"  (1.575 m)   Wt 195 lb (88.5 kg)   BMI 35.67 kg/m   Review of Systems: See HPI above.    Objective:  Physical Exam:  Gen: NAD, comfortable in exam room  Left shoulder: No swelling, ecchymoses.  No gross deformity. No TTP. FROM without pain. Negative Hawkins, Neers. Negative Speeds, Yergasons. Strength 5/5 with empty can and resisted internal/external rotation. Negative apprehension. NV intact distally.  Right shoulder: FROM without pain.    Assessment & Plan:  1. Left shoulder pain - independently reviewed radiographs of scapula and no evidence fracture, bony abnormalities or lesions.  Exam is very benign and reassuring.  Consistent with spasms deep to the scapula.  Continue wit heat as needed.  Discussed considering physical therapy  but this should resolve over time.  Follow up with [place] as needed.

## 2016-04-15 ENCOUNTER — Encounter: Payer: Self-pay | Admitting: Family Medicine

## 2016-04-15 DIAGNOSIS — Z6836 Body mass index (BMI) 36.0-36.9, adult: Secondary | ICD-10-CM | POA: Diagnosis not present

## 2016-04-15 DIAGNOSIS — Z01419 Encounter for gynecological examination (general) (routine) without abnormal findings: Secondary | ICD-10-CM | POA: Diagnosis not present

## 2016-04-15 DIAGNOSIS — Z0000 Encounter for general adult medical examination without abnormal findings: Secondary | ICD-10-CM | POA: Diagnosis not present

## 2016-04-18 ENCOUNTER — Ambulatory Visit (INDEPENDENT_AMBULATORY_CARE_PROVIDER_SITE_OTHER): Payer: BLUE CROSS/BLUE SHIELD | Admitting: Family Medicine

## 2016-04-18 ENCOUNTER — Encounter: Payer: Self-pay | Admitting: Family Medicine

## 2016-04-18 VITALS — BP 108/77 | HR 89 | Ht 62.0 in | Wt 185.0 lb

## 2016-04-18 DIAGNOSIS — M5010 Cervical disc disorder with radiculopathy, unspecified cervical region: Secondary | ICD-10-CM | POA: Diagnosis not present

## 2016-04-18 DIAGNOSIS — M542 Cervicalgia: Secondary | ICD-10-CM

## 2016-04-19 DIAGNOSIS — M542 Cervicalgia: Secondary | ICD-10-CM | POA: Insufficient documentation

## 2016-04-19 NOTE — Assessment & Plan Note (Signed)
Patient's symptoms are in an unusual distribution and not particularly severe but a constant soreness for several months.  Progressed to include the right side now.  Possible this is from a lower cervical disc bulge, central canal stenosis.  We discussed options (prednisone, PT, MRI) - will go ahead with MRI to further assess.  Heat, nsaids, muscle relaxant as needed in meantime.

## 2016-04-19 NOTE — Progress Notes (Addendum)
PCP and consultation requested by: Leeanne Rio, PA-C  Subjective:   HPI: Patient is a 45 y.o. female here for left shoulder blade pain.  8/31: Patient denies known injury or trauma. She reports about 3 months ago she started to develop a dull ache in left shoulder blade. No pain with specific motions, lying down, etc. Does not improve with anything specific. No benefit with meloxicam and flexeril just caused sleepiness. Pain is currently 0/10 in this rea. No tightness. No history of malignancy.  No skin changes, numbness.  9/28: Patient reports pain seems to have progressed since last visit. Still describes soreness but now going into the right side of her shoulder, scapula.  Right arm feels weak. Pain currently 0/10, again is a soreness. No numbness or tingling. No radiationdown arm. Taking nsaid and muscle relaxant. No skin changes.  Past Medical History:  Diagnosis Date  . History of chicken pox   . Venous thrombosis of leg    Left    Current Outpatient Prescriptions on File Prior to Visit  Medication Sig Dispense Refill  . aspirin 81 MG tablet Take 81 mg by mouth daily.    Marland Kitchen CAMILA 0.35 MG tablet Take 1 tablet by mouth daily.  3  . cyclobenzaprine (FLEXERIL) 10 MG tablet     . ketoconazole (NIZORAL) 2 % cream Apply 1 application topically 2 (two) times daily. For 2 weeks. 15 g 0  . Multiple Vitamin (MULTIVITAMIN) tablet Take 1 tablet by mouth as directed.    . phentermine 37.5 MG capsule Take 1 capsule (37.5 mg total) by mouth every morning. 30 capsule 1   No current facility-administered medications on file prior to visit.     Past Surgical History:  Procedure Laterality Date  . BREAST SURGERY     Fibroidadenoma-Left  . CHOLECYSTECTOMY    . REFRACTIVE SURGERY     Bilateral  . ruptured disc    . TONSILLECTOMY    . WISDOM TOOTH EXTRACTION      Allergies  Allergen Reactions  . Amoxil [Amoxicillin] Hives    Social History   Social History  .  Marital status: Single    Spouse name: N/A  . Number of children: N/A  . Years of education: N/A   Occupational History  . Not on file.   Social History Main Topics  . Smoking status: Never Smoker  . Smokeless tobacco: Never Used  . Alcohol use 0.0 oz/week     Comment: rare  . Drug use: No  . Sexual activity: Yes    Partners: Male     Comment: boyfriend   Other Topics Concern  . Not on file   Social History Narrative  . No narrative on file    Family History  Problem Relation Age of Onset  . Brain cancer Mother 11    Deceased-Tumor  . Glaucoma Maternal Grandfather   . Glaucoma Maternal Aunt   . Cancer Maternal Grandmother   . Lung cancer Maternal Aunt   . Healthy Sister     x1    BP 108/77   Pulse 89   Ht 5\' 2"  (1.575 m)   Wt 185 lb (83.9 kg)   BMI 33.84 kg/m   Review of Systems: See HPI above.    Objective:  Physical Exam:  Gen: NAD, comfortable in exam room  Neck: No gross deformity, swelling, bruising. TTP between upper scapulae.  No midline/bony TTP. FROM neck without pain. BUE strength 5/5.   Sensation intact to light  touch.   NV intact distal BUEs.  Left shoulder: No swelling, ecchymoses.  No gross deformity. No TTP. FROM without pain. Negative Hawkins, Neers. Negative Speeds, Yergasons. Strength 5/5 with empty can and resisted internal/external rotation. Negative apprehension. NV intact distally.  Right shoulder: FROM without pain.    Assessment & Plan:  1. Left shoulder/upper back and neck pain - Patient's symptoms are in an unusual distribution and not particularly severe but a constant soreness for several months.  Progressed to include the right side now.  Possible this is from a lower cervical disc bulge, central canal stenosis.  We discussed options (prednisone, PT, MRI) - will go ahead with MRI to further assess.  Heat, nsaids, muscle relaxant as needed in meantime.  Addendum:  MRI reviewed and discussed with patient.  She does  have a central disc extrusion with mild-moderate spinal stenosis and cord mass effect but no evidence of edema within the cord.  We discussed physical therapy, injection, neurosurgery referral.  I recommended given her symptoms and the mass effect on the cord/narrowing here we go ahead with neurosurgery referral to further discuss these options, risks/benefits, prognosis.  She has seen Dr. Arnoldo Morale in the past - will refer back to him.

## 2016-04-20 ENCOUNTER — Ambulatory Visit (HOSPITAL_BASED_OUTPATIENT_CLINIC_OR_DEPARTMENT_OTHER)
Admission: RE | Admit: 2016-04-20 | Discharge: 2016-04-20 | Disposition: A | Discharge status: Home / Self Care | Payer: BLUE CROSS/BLUE SHIELD | Source: Ambulatory Visit | Attending: Family Medicine | Admitting: Family Medicine

## 2016-04-20 DIAGNOSIS — M5010 Cervical disc disorder with radiculopathy, unspecified cervical region: Secondary | ICD-10-CM | POA: Diagnosis not present

## 2016-04-20 DIAGNOSIS — M5021 Other cervical disc displacement,  high cervical region: Secondary | ICD-10-CM | POA: Insufficient documentation

## 2016-04-20 DIAGNOSIS — M4182 Other forms of scoliosis, cervical region: Secondary | ICD-10-CM | POA: Insufficient documentation

## 2016-04-20 DIAGNOSIS — M4802 Spinal stenosis, cervical region: Secondary | ICD-10-CM | POA: Insufficient documentation

## 2016-04-22 ENCOUNTER — Encounter: Payer: Self-pay | Admitting: Family Medicine

## 2016-04-23 NOTE — Telephone Encounter (Signed)
I spoke with her on the phone today - see addendum to her office note.

## 2016-04-26 ENCOUNTER — Encounter: Payer: Self-pay | Admitting: Physician Assistant

## 2016-04-26 ENCOUNTER — Ambulatory Visit (INDEPENDENT_AMBULATORY_CARE_PROVIDER_SITE_OTHER): Payer: BLUE CROSS/BLUE SHIELD | Admitting: Physician Assistant

## 2016-04-26 DIAGNOSIS — L219 Seborrheic dermatitis, unspecified: Secondary | ICD-10-CM

## 2016-04-26 MED ORDER — KETOCONAZOLE 2 % EX CREA: 1.0000 "application " | TOPICAL_CREAM | Freq: Two times a day (BID) | CUTANEOUS | 0 refills | Status: DC

## 2016-04-26 NOTE — Progress Notes (Signed)
Pre visit review using our clinic review tool, if applicable. No additional management support is needed unless otherwise documented below in the visit note/SLS  

## 2016-04-26 NOTE — Patient Instructions (Signed)
Please use the Ketoconazole cream applied to the area twice daily.  You can use OTC hydrocortisone cream to the area once daily for the next week -- only a very small amount.   Keep skin clean and dry.  Use gentle soap like Cetaphil to the area. Cool compresses to the area.  Follow-up if not resolving.

## 2016-04-26 NOTE — Progress Notes (Signed)
Patient presents to clinic today c/o 2 days of redness and scaling of upper eyelid of R eye. Patient endorses some milder flaking of the upper eyelid x 3 weeks previously, treated with OTC tea oil and moisturizers without improvement. Denies pain or itch. Denies irritation of the eye itself. Denies trauma or injury. Denies vision changes. Denies fever, chills, malaise/fatigue.   Past Medical History:  Diagnosis Date  . History of chicken pox   . Venous thrombosis of leg    Left    Current Outpatient Prescriptions on File Prior to Visit  Medication Sig Dispense Refill  . aspirin 81 MG tablet Take 81 mg by mouth daily.    Marland Kitchen CAMILA 0.35 MG tablet Take 1 tablet by mouth daily.  3  . cyclobenzaprine (FLEXERIL) 10 MG tablet     . ketoconazole (NIZORAL) 2 % cream Apply 1 application topically 2 (two) times daily. For 2 weeks. (Patient taking differently: Apply 1 application topically 2 (two) times daily as needed. For 2 weeks.) 15 g 0  . Multiple Vitamin (MULTIVITAMIN) tablet Take 1 tablet by mouth as directed.    . phentermine 37.5 MG capsule Take 1 capsule (37.5 mg total) by mouth every morning. 30 capsule 1   No current facility-administered medications on file prior to visit.     Allergies  Allergen Reactions  . Amoxil [Amoxicillin] Hives    Family History  Problem Relation Age of Onset  . Brain cancer Mother 106    Deceased-Tumor  . Glaucoma Maternal Grandfather   . Glaucoma Maternal Aunt   . Cancer Maternal Grandmother   . Lung cancer Maternal Aunt   . Healthy Sister     x1    Social History   Social History  . Marital status: Single    Spouse name: N/A  . Number of children: N/A  . Years of education: N/A   Social History Main Topics  . Smoking status: Never Smoker  . Smokeless tobacco: Never Used  . Alcohol use 0.0 oz/week     Comment: rare  . Drug use: No  . Sexual activity: Yes    Partners: Male     Comment: boyfriend   Other Topics Concern  . None    Social History Narrative  . None    Review of Systems - See HPI.  All other ROS are negative.  BP 118/88 (BP Location: Right Arm, Patient Position: Sitting, Cuff Size: Large)   Pulse (!) 107   Temp 98.2 F (36.8 C) (Oral)   Resp 16   Ht 5\' 2"  (1.575 m)   Wt 199 lb 4 oz (90.4 kg)   SpO2 98%   BMI 36.44 kg/m   Physical Exam  Constitutional: She is well-developed, well-nourished, and in no distress.  HENT:  Head: Normocephalic and atraumatic.  Eyes: Conjunctivae and EOM are normal. Pupils are equal, round, and reactive to light.    Neck: Neck supple.  Cardiovascular: Normal rate, regular rhythm, normal heart sounds and intact distal pulses.   Pulmonary/Chest: Effort normal and breath sounds normal. No respiratory distress. She has no wheezes. She has no rales. She exhibits no tenderness.  Vitals reviewed.  Assessment/Plan: 1. Seborrheic dermatitis Will start anti-dandruff shampoo to the area with gentle scrubs. Will also start Ketoconazole twice daily in case on contributing yeast. Supportive measures reviewed. Follow-up if not resolving.  - ketoconazole (NIZORAL) 2 % cream; Apply 1 application topically 2 (two) times daily. For 2 weeks.  Dispense: 15 g; Refill: 0  Leeanne Rio, PA-C

## 2016-05-10 ENCOUNTER — Encounter: Payer: Self-pay | Admitting: Physician Assistant

## 2016-05-10 ENCOUNTER — Ambulatory Visit (INDEPENDENT_AMBULATORY_CARE_PROVIDER_SITE_OTHER): Payer: BLUE CROSS/BLUE SHIELD | Admitting: Physician Assistant

## 2016-05-10 VITALS — BP 98/68 | HR 85 | Temp 98.0°F | Resp 16 | Ht 62.0 in | Wt 197.1 lb

## 2016-05-10 DIAGNOSIS — R21 Rash and other nonspecific skin eruption: Secondary | ICD-10-CM | POA: Insufficient documentation

## 2016-05-10 DIAGNOSIS — E669 Obesity, unspecified: Secondary | ICD-10-CM

## 2016-05-10 DIAGNOSIS — Z6836 Body mass index (BMI) 36.0-36.9, adult: Secondary | ICD-10-CM

## 2016-05-10 NOTE — Patient Instructions (Signed)
Please finish last tablets of the phentermine. Continue diet and exercise. We will monitor weight off of the medication. If continuing regimen but noting increased weight, please give me a call.  Follow-up 3 months.  Lastly, you will be contacted by Dermatology for further assessment.

## 2016-05-10 NOTE — Progress Notes (Signed)
Patient presents to clinic today for follow-up of obesity. Is currently on phentermine 37.5 mg daily. Is taking as directed without side effect. Is about to finish final month of medication. Endorses walking and jogging for exercise most days of the week. Sometimes takes a break on Sunday. Endorses well-balanced diet overall. Denies racing heart, chest pain, dizziness with medication. Endorses 15 pound weight loss overall.  Patient still noting itching skin of R upper eyelid with flaking. Is not improving with Ketoconazole and hydrocortisone. Denies new or worsening symptoms.  Past Medical History:  Diagnosis Date  . History of chicken pox   . Venous thrombosis of leg    Left    Current Outpatient Prescriptions on File Prior to Visit  Medication Sig Dispense Refill  . aspirin 81 MG tablet Take 81 mg by mouth daily.    Marland Kitchen Donna Mendoza 0.35 MG tablet Take 1 tablet by mouth daily.  3  . ketoconazole (NIZORAL) 2 % cream Apply 1 application topically 2 (two) times daily. For 2 weeks. (Patient taking differently: Apply 1 application topically 2 (two) times daily as needed. For 2 weeks.) 15 g 0  . Multiple Vitamin (MULTIVITAMIN) tablet Take 1 tablet by mouth as directed.    . phentermine 37.5 MG capsule Take 1 capsule (37.5 mg total) by mouth every morning. 30 capsule 1   No current facility-administered medications on file prior to visit.     Allergies  Allergen Reactions  . Amoxil [Amoxicillin] Hives    Family History  Problem Relation Age of Onset  . Brain cancer Mother 65    Deceased-Tumor  . Glaucoma Maternal Grandfather   . Glaucoma Maternal Aunt   . Cancer Maternal Grandmother   . Lung cancer Maternal Aunt   . Healthy Sister     x1    Social History   Social History  . Marital status: Single    Spouse name: N/A  . Number of children: N/A  . Years of education: N/A   Social History Main Topics  . Smoking status: Never Smoker  . Smokeless tobacco: Never Used  . Alcohol  use 0.0 oz/week     Comment: rare  . Drug use: No  . Sexual activity: Yes    Partners: Male     Comment: boyfriend   Other Topics Concern  . None   Social History Narrative  . None   Review of Systems - See HPI.  All other ROS are negative.  BP 98/68 (BP Location: Right Arm, Patient Position: Sitting, Cuff Size: Large)   Pulse 85   Temp 98 F (36.7 C) (Oral)   Resp 16   Ht 5\' 2"  (1.575 m)   Wt 197 lb 2 oz (89.4 kg)   SpO2 98%   BMI 36.05 kg/m   Physical Exam  Constitutional: She is oriented to person, place, and time and well-developed, well-nourished, and in no distress.  HENT:  Head: Normocephalic and atraumatic.  Eyes: Conjunctivae are normal.  Cardiovascular: Normal rate, regular rhythm, normal heart sounds and intact distal pulses.   Pulmonary/Chest: Effort normal and breath sounds normal. No respiratory distress. She has no wheezes. She has no rales. She exhibits no tenderness.  Neurological: She is alert and oriented to person, place, and time.  Skin: Skin is warm and dry. No rash noted.  Psychiatric: Affect normal.  Vitals reviewed.  Assessment/Plan: 1. Rash and nonspecific skin eruption Of R upper eyelid. Thought to be seborrhea initially but not responsive. Will refer to [facility]  for further assessment. Supportive measures reviewed. - Ambulatory referral to Dermatology  2. Class 2 obesity without serious comorbidity with body mass index (BMI) of 36.0 to 36.9 in adult, unspecified obesity type 13-15 pound weight loss (home scale versus office). Will finish last Rx phentermine. Continue diet and exercise. FU 3 months.   Leeanne Rio, PA-C

## 2016-05-15 ENCOUNTER — Telehealth: Payer: Self-pay | Admitting: Physician Assistant

## 2016-05-15 NOTE — Telephone Encounter (Signed)
Patient thought she was getting a referral for [facility] with an office in [place]. However, she received a call from [facility]. Please advise.

## 2016-05-16 NOTE — Telephone Encounter (Signed)
Anderson Malta will you change to [facility], Dr. Ruben Reason location please?  Katie, please let patient know that we will take care of this and she will get another call, this time from [place] for an appointment.

## 2016-05-16 NOTE — Telephone Encounter (Signed)
LVM to have patient call back the office regarding her referral.

## 2016-05-16 NOTE — Telephone Encounter (Signed)
Referral faxed to [facility], their office will call patient and set up appt

## 2016-05-21 DIAGNOSIS — M5020 Other cervical disc displacement, unspecified cervical region: Secondary | ICD-10-CM | POA: Diagnosis not present

## 2016-05-31 ENCOUNTER — Encounter: Payer: Self-pay | Admitting: Physician Assistant

## 2016-06-03 ENCOUNTER — Encounter: Payer: Self-pay | Admitting: Physician Assistant

## 2016-06-03 ENCOUNTER — Ambulatory Visit (INDEPENDENT_AMBULATORY_CARE_PROVIDER_SITE_OTHER): Payer: BLUE CROSS/BLUE SHIELD | Admitting: Physician Assistant

## 2016-06-03 VITALS — BP 100/68 | HR 64 | Temp 98.1°F | Resp 14 | Ht 62.0 in | Wt 199.0 lb

## 2016-06-03 DIAGNOSIS — Z23 Encounter for immunization: Secondary | ICD-10-CM

## 2016-06-03 DIAGNOSIS — E669 Obesity, unspecified: Secondary | ICD-10-CM | POA: Diagnosis not present

## 2016-06-03 DIAGNOSIS — M722 Plantar fascial fibromatosis: Secondary | ICD-10-CM | POA: Diagnosis not present

## 2016-06-03 DIAGNOSIS — M71572 Other bursitis, not elsewhere classified, left ankle and foot: Secondary | ICD-10-CM | POA: Diagnosis not present

## 2016-06-03 DIAGNOSIS — Z0000 Encounter for general adult medical examination without abnormal findings: Secondary | ICD-10-CM | POA: Diagnosis not present

## 2016-06-03 LAB — LIPID PANEL
Cholesterol: 154 mg/dL (ref 0–200)
HDL: 50.4 mg/dL (ref >39.00)
LDL Cholesterol: 82 mg/dL (ref 0–99)
NonHDL: 103.8
Total CHOL/HDL Ratio: 3
Triglycerides: 110 mg/dL (ref 0.0–149.0)
VLDL: 22 mg/dL (ref 0.0–40.0)

## 2016-06-03 LAB — COMPREHENSIVE METABOLIC PANEL
ALT: 26 U/L (ref 0–35)
AST: 22 U/L (ref 0–37)
Albumin: 3.8 g/dL (ref 3.5–5.2)
Alkaline Phosphatase: 86 U/L (ref 39–117)
BUN: 11 mg/dL (ref 6–23)
CO2: 30 mEq/L (ref 19–32)
Calcium: 9 mg/dL (ref 8.4–10.5)
Chloride: 104 mEq/L (ref 96–112)
Creatinine, Ser: 0.64 mg/dL (ref 0.40–1.20)
GFR: 106.02 mL/min (ref >60.00)
Glucose, Bld: 95 mg/dL (ref 70–99)
Potassium: 4.5 mEq/L (ref 3.5–5.1)
Sodium: 139 mEq/L (ref 135–145)
Total Bilirubin: 0.7 mg/dL (ref 0.2–1.2)
Total Protein: 6.2 g/dL (ref 6.0–8.3)

## 2016-06-03 LAB — POCT URINALYSIS DIPSTICK
Bilirubin, UA: NEGATIVE
Glucose, UA: NEGATIVE
Ketones, UA: NEGATIVE
Leukocytes, UA: NEGATIVE
Nitrite, UA: NEGATIVE
Protein, UA: NEGATIVE
Spec Grav, UA: 1.025
Urobilinogen, UA: 0.2
pH, UA: 6

## 2016-06-03 LAB — CBC
HCT: 44.2 % (ref 36.0–46.0)
Hemoglobin: 14.9 g/dL (ref 12.0–15.0)
MCHC: 33.7 g/dL (ref 30.0–36.0)
MCV: 88.8 fl (ref 78.0–100.0)
Platelets: 271 10*3/uL (ref 150.0–400.0)
RBC: 4.99 Mil/uL (ref 3.87–5.11)
RDW: 13.5 % (ref 11.5–15.5)
WBC: 8 10*3/uL (ref 4.0–10.5)

## 2016-06-03 LAB — TSH: TSH: 1.05 u[IU]/mL (ref 0.35–4.50)

## 2016-06-03 LAB — HEMOGLOBIN A1C: Hgb A1c MFr Bld: 5.7 % (ref 4.6–6.5)

## 2016-06-03 MED ORDER — LORCASERIN HCL 10 MG PO TABS: 10.0000 mg | ORAL_TABLET | Freq: Two times a day (BID) | ORAL | 1 refills | Status: DC

## 2016-06-03 NOTE — Addendum Note (Signed)
Addended by: Leonidas Romberg on: 06/03/2016 10:57 AM   Modules accepted: Orders

## 2016-06-03 NOTE — Assessment & Plan Note (Signed)
Depression screen negative. Health Maintenance reviewed -- TD today. Mammogram scheduled for 06/18/16. PAP by GYN. Up-to-date per patient.  Preventive schedule discussed and handout given in AVS. Will obtain fasting labs today.

## 2016-06-03 NOTE — Progress Notes (Signed)
Patient presents to clinic today for annual exam.  Patient is fasting for labs.  Acute Concerns: Denies acute concerns today.  Chronic Issues: Body mass index is 36.4 kg/m.  Patient recently finished 29-month course of phentermine. Is on drug holiday. Has continue diet and exercise. Would like to discuss other options.   Health Maintenance: Immunizations -- Flu shot at work in October.  Agrees to Tetanus today. Last in 1989 per patient.  Mammogram --scheduled for 11/29 at 1:40 PM. PAP -- Up-to-date. Every 3 years with GYN. Denies history of abnormal PAP smear.  Past Medical History:  Diagnosis Date  . History of chicken pox   . Venous thrombosis of leg    Left    Past Surgical History:  Procedure Laterality Date  . BREAST SURGERY     Fibroidadenoma-Left  . CHOLECYSTECTOMY    . REFRACTIVE SURGERY     Bilateral  . ruptured disc    . TONSILLECTOMY    . WISDOM TOOTH EXTRACTION      Current Outpatient Prescriptions on File Prior to Visit  Medication Sig Dispense Refill  . aspirin 81 MG tablet Take 81 mg by mouth daily.    Marland Kitchen CAMILA 0.35 MG tablet Take 1 tablet by mouth daily.  3  . Multiple Vitamin (MULTIVITAMIN) tablet Take 1 tablet by mouth as directed.     No current facility-administered medications on file prior to visit.     Allergies  Allergen Reactions  . Amoxil [Amoxicillin] Hives    Family History  Problem Relation Age of Onset  . Brain cancer Mother 74    Deceased-Tumor  . Glaucoma Maternal Grandfather   . Glaucoma Maternal Aunt   . Cancer Maternal Grandmother   . Lung cancer Maternal Aunt   . Healthy Sister     x1    Social History   Social History  . Marital status: Single    Spouse name: N/A  . Number of children: N/A  . Years of education: N/A   Occupational History  . Not on file.   Social History Main Topics  . Smoking status: Never Smoker  . Smokeless tobacco: Never Used  . Alcohol use 0.0 oz/week     Comment: rare  . Drug  use: No  . Sexual activity: Yes    Partners: Male    Birth control/ protection: Pill     Comment: boyfriend   Other Topics Concern  . Not on file   Social History Narrative  . No narrative on file   Review of Systems  Constitutional: Negative for fever and weight loss.  HENT: Negative for ear discharge, ear pain, hearing loss and tinnitus.   Eyes: Negative for blurred vision, double vision, photophobia and pain.  Respiratory: Negative for cough and shortness of breath.   Cardiovascular: Negative for chest pain and palpitations.  Gastrointestinal: Negative for abdominal pain, blood in stool, constipation, diarrhea, heartburn, melena, nausea and vomiting.  Genitourinary: Negative for dysuria, flank pain, frequency, hematuria and urgency.  Musculoskeletal: Negative for falls.  Neurological: Negative for dizziness, loss of consciousness and headaches.  Endo/Heme/Allergies: Negative for environmental allergies.  Psychiatric/Behavioral: Negative for depression, hallucinations, substance abuse and suicidal ideas. The patient is not nervous/anxious and does not have insomnia.     BP 100/68   Pulse 64   Temp 98.1 F (36.7 C) (Oral)   Resp 14   Ht 5\' 2"  (1.575 m)   Wt 199 lb (90.3 kg)   SpO2 98%   BMI  36.40 kg/m   Physical Exam  Constitutional: She is oriented to person, place, and time and well-developed, well-nourished, and in no distress.  HENT:  Head: Normocephalic and atraumatic.  Right Ear: Tympanic membrane, external ear and ear canal normal.  Left Ear: Tympanic membrane, external ear and ear canal normal.  Nose: Nose normal. No mucosal edema.  Mouth/Throat: Uvula is midline, oropharynx is clear and moist and mucous membranes are normal. No oropharyngeal exudate or posterior oropharyngeal erythema.  Eyes: Conjunctivae are normal. Pupils are equal, round, and reactive to light.  Neck: Neck supple. No thyromegaly present.  Cardiovascular: Normal rate, regular rhythm, normal  heart sounds and intact distal pulses.   Pulmonary/Chest: Effort normal and breath sounds normal. No respiratory distress. She has no wheezes. She has no rales.  Abdominal: Soft. Bowel sounds are normal. She exhibits no distension and no mass. There is no tenderness. There is no rebound and no guarding.  Lymphadenopathy:    She has no cervical adenopathy.  Neurological: She is alert and oriented to person, place, and time. No cranial nerve deficit.  Skin: Skin is warm and dry. No rash noted.  Psychiatric: Affect normal.  Vitals reviewed.  Assessment/Plan: Obesity Continue diet and exercise. Will start trial of Belviq.   Visit for preventive health examination Depression screen negative. Health Maintenance reviewed -- TD today. Mammogram scheduled for 06/18/16. PAP by GYN. Up-to-date per patient.  Preventive schedule discussed and handout given in AVS. Will obtain fasting labs today.     Leeanne Rio, PA-C

## 2016-06-03 NOTE — Assessment & Plan Note (Signed)
Continue diet and exercise. Will start trial of Belviq.

## 2016-06-03 NOTE — Patient Instructions (Signed)
Please go to the lab for blood work.   Our office will call you with your results unless you have chosen to receive results via MyChart.  If your blood work is normal we will follow-up each year for physicals and as scheduled for chronic medical problems.  If anything is abnormal we will treat accordingly and get you in for a follow-up.  Please take the [place] to the pharmacy.  If you have any issue filling, please call me.   Preventive Care for Adults, Female A healthy lifestyle and preventive care can promote health and wellness. Preventive health guidelines for women include the following key practices.  A routine yearly physical is a good way to check with your health care provider about your health and preventive screening. It is a chance to share any concerns and updates on your health and to receive a thorough exam.  Visit your dentist for a routine exam and preventive care every 6 months. Brush your teeth twice a day and floss once a day. Good oral hygiene prevents tooth decay and gum disease.  The frequency of eye exams is based on your age, health, family medical history, use of contact lenses, and other factors. Follow your health care provider's recommendations for frequency of eye exams.  Eat a healthy diet. Foods like vegetables, fruits, whole grains, low-fat dairy products, and lean protein foods contain the nutrients you need without too many calories. Decrease your intake of foods high in solid fats, added sugars, and salt. Eat the right amount of calories for you.Get information about a proper diet from your health care provider, if necessary.  Regular physical exercise is one of the most important things you can do for your health. Most adults should get at least 150 minutes of moderate-intensity exercise (any activity that increases your heart rate and causes you to sweat) each week. In addition, most adults need muscle-strengthening exercises on 2 or more days a  week.  Maintain a healthy weight. The body mass index (BMI) is a screening tool to identify possible weight problems. It provides an estimate of body fat based on height and weight. Your health care provider can find your BMI and can help you achieve or maintain a healthy weight.For adults 20 years and older:  A BMI below 18.5 is considered underweight.  A BMI of 18.5 to 24.9 is normal.  A BMI of 25 to 29.9 is considered overweight.  A BMI of 30 and above is considered obese.  Maintain normal blood lipids and cholesterol levels by exercising and minimizing your intake of saturated fat. Eat a balanced diet with plenty of fruit and vegetables. Blood tests for lipids and cholesterol should begin at age 66 and be repeated every 5 years. If your lipid or cholesterol levels are high, you are over 50, or you are at high risk for heart disease, you may need your cholesterol levels checked more frequently.Ongoing high lipid and cholesterol levels should be treated with medicines if diet and exercise are not working.  If you smoke, find out from your health care provider how to quit. If you do not use tobacco, do not start.  Lung cancer screening is recommended for adults aged 29-80 years who are at high risk for developing lung cancer because of a history of smoking. A yearly low-dose CT scan of the lungs is recommended for people who have at least a 30-pack-year history of smoking and are a current smoker or have quit within the past 15 years.  A pack year of smoking is smoking an average of 1 pack of cigarettes a day for 1 year (for example: 1 pack a day for 30 years or 2 packs a day for 15 years). Yearly screening should continue until the smoker has stopped smoking for at least 15 years. Yearly screening should be stopped for people who develop a health problem that would prevent them from having lung cancer treatment.  If you are pregnant, do not drink alcohol. If you are breastfeeding, be very  cautious about drinking alcohol. If you are not pregnant and choose to drink alcohol, do not have more than 1 drink per day. One drink is considered to be 12 ounces (355 mL) of beer, 5 ounces (148 mL) of wine, or 1.5 ounces (44 mL) of liquor.  Avoid use of street drugs. Do not share needles with anyone. Ask for help if you need support or instructions about stopping the use of drugs.  High blood pressure causes heart disease and increases the risk of stroke. Your blood pressure should be checked at least every 1 to 2 years. Ongoing high blood pressure should be treated with medicines if weight loss and exercise do not work.  If you are 13-40 years old, ask your health care provider if you should take aspirin to prevent strokes.  Diabetes screening is done by taking a blood sample to check your blood glucose level after you have not eaten for a certain period of time (fasting). If you are not overweight and you do not have risk factors for diabetes, you should be screened once every 3 years starting at age 21. If you are overweight or obese and you are 47-81 years of age, you should be screened for diabetes every year as part of your cardiovascular risk assessment.  Breast cancer screening is essential preventive care for women. You should practice "breast self-awareness." This means understanding the normal appearance and feel of your breasts and may include breast self-examination. Any changes detected, no matter how small, should be reported to a health care provider. Women in their 61s and 30s should have a clinical breast exam (CBE) by a health care provider as part of a regular health exam every 1 to 3 years. After age 41, women should have a CBE every year. Starting at age 73, women should consider having a mammogram (breast X-ray test) every year. Women who have a family history of breast cancer should talk to their health care provider about genetic screening. Women at a high risk of breast cancer  should talk to their health care providers about having an MRI and a mammogram every year.  Breast cancer gene (BRCA)-related cancer risk assessment is recommended for women who have family members with BRCA-related cancers. BRCA-related cancers include breast, ovarian, tubal, and peritoneal cancers. Having family members with these cancers may be associated with an increased risk for harmful changes (mutations) in the breast cancer genes BRCA1 and BRCA2. Results of the assessment will determine the need for genetic counseling and BRCA1 and BRCA2 testing.  Your health care provider may recommend that you be screened regularly for cancer of the pelvic organs (ovaries, uterus, and vagina). This screening involves a pelvic examination, including checking for microscopic changes to the surface of your cervix (Pap test). You may be encouraged to have this screening done every 3 years, beginning at age 58.  For women ages 85-65, health care providers may recommend pelvic exams and Pap testing every 3 years, or they may  recommend the Pap and pelvic exam, combined with testing for human papilloma virus (HPV), every 5 years. Some types of HPV increase your risk of cervical cancer. Testing for HPV may also be done on women of any age with unclear Pap test results.  Other health care providers may not recommend any screening for nonpregnant women who are considered low risk for pelvic cancer and who do not have symptoms. Ask your health care provider if a screening pelvic exam is right for you.  If you have had past treatment for cervical cancer or a condition that could lead to cancer, you need Pap tests and screening for cancer for at least 20 years after your treatment. If Pap tests have been discontinued, your risk factors (such as having a new sexual partner) need to be reassessed to determine if screening should resume. Some women have medical problems that increase the chance of getting cervical cancer. In  these cases, your health care provider may recommend more frequent screening and Pap tests.  Colorectal cancer can be detected and often prevented. Most routine colorectal cancer screening begins at the age of 4 years and continues through age 55 years. However, your health care provider may recommend screening at an earlier age if you have risk factors for colon cancer. On a yearly basis, your health care provider may provide home test kits to check for hidden blood in the stool. Use of a small camera at the end of a tube, to directly examine the colon (sigmoidoscopy or colonoscopy), can detect the earliest forms of colorectal cancer. Talk to your health care provider about this at age 56, when routine screening begins. Direct exam of the colon should be repeated every 5-10 years through age 35 years, unless early forms of precancerous polyps or small growths are found.  People who are at an increased risk for hepatitis B should be screened for this virus. You are considered at high risk for hepatitis B if:  You were born in a country where hepatitis B occurs often. Talk with your health care provider about which countries are considered high risk.  Your parents were born in a high-risk country and you have not received a shot to protect against hepatitis B (hepatitis B vaccine).  You have HIV or AIDS.  You use needles to inject street drugs.  You live with, or have sex with, someone who has hepatitis B.  You get hemodialysis treatment.  You take certain medicines for conditions like cancer, organ transplantation, and autoimmune conditions.  Hepatitis C blood testing is recommended for all people born from [birth year] through 1965 and any individual with known risks for hepatitis C.  Practice safe sex. Use condoms and avoid high-risk sexual practices to reduce the spread of sexually transmitted infections (STIs). STIs include gonorrhea, chlamydia, syphilis, trichomonas, herpes, HPV, and human  immunodeficiency virus (HIV). Herpes, HIV, and HPV are viral illnesses that have no cure. They can result in disability, cancer, and death.  You should be screened for sexually transmitted illnesses (STIs) including gonorrhea and chlamydia if:  You are sexually active and are younger than 24 years.  You are older than 24 years and your health care provider tells you that you are at risk for this type of infection.  Your sexual activity has changed since you were last screened and you are at an increased risk for chlamydia or gonorrhea. Ask your health care provider if you are at risk.  If you are at risk of being infected  with HIV, it is recommended that you take a prescription medicine daily to prevent HIV infection. This is called preexposure prophylaxis (PrEP). You are considered at risk if:  You are sexually active and do not regularly use condoms or know the HIV status of your partner(s).  You take drugs by injection.  You are sexually active with a partner who has HIV.  Talk with your health care provider about whether you are at high risk of being infected with HIV. If you choose to begin PrEP, you should first be tested for HIV. You should then be tested every 3 months for as long as you are taking PrEP.  Osteoporosis is a disease in which the bones lose minerals and strength with aging. This can result in serious bone fractures or breaks. The risk of osteoporosis can be identified using a bone density scan. Women ages 26 years and over and women at risk for fractures or osteoporosis should discuss screening with their health care providers. Ask your health care provider whether you should take a calcium supplement or vitamin D to reduce the rate of osteoporosis.  Menopause can be associated with physical symptoms and risks. Hormone replacement therapy is available to decrease symptoms and risks. You should talk to your health care provider about whether hormone replacement therapy is  right for you.  Use sunscreen. Apply sunscreen liberally and repeatedly throughout the day. You should seek shade when your shadow is shorter than you. Protect yourself by wearing long sleeves, pants, a wide-brimmed hat, and sunglasses year round, whenever you are outdoors.  Once a month, do a whole body skin exam, using a mirror to look at the skin on your back. Tell your health care provider of new moles, moles that have irregular borders, moles that are larger than a pencil eraser, or moles that have changed in shape or color.  Stay current with required vaccines (immunizations).  Influenza vaccine. All adults should be immunized every year.  Tetanus, diphtheria, and acellular pertussis (Td, Tdap) vaccine. Pregnant women should receive 1 dose of Tdap vaccine during each pregnancy. The dose should be obtained regardless of the length of time since the last dose. Immunization is preferred during the 27th-36th week of gestation. An adult who has not previously received Tdap or who does not know her vaccine status should receive 1 dose of Tdap. This initial dose should be followed by tetanus and diphtheria toxoids (Td) booster doses every 10 years. Adults with an unknown or incomplete history of completing a 3-dose immunization series with Td-containing vaccines should begin or complete a primary immunization series including a Tdap dose. Adults should receive a Td booster every 10 years.  Varicella vaccine. An adult without evidence of immunity to varicella should receive 2 doses or a second dose if she has previously received 1 dose. Pregnant females who do not have evidence of immunity should receive the first dose after pregnancy. This first dose should be obtained before leaving the health care facility. The second dose should be obtained 4-8 weeks after the first dose.  Human papillomavirus (HPV) vaccine. Females aged 13-26 years who have not received the vaccine previously should obtain the  3-dose series. The vaccine is not recommended for use in pregnant females. However, pregnancy testing is not needed before receiving a dose. If a female is found to be pregnant after receiving a dose, no treatment is needed. In that case, the remaining doses should be delayed until after the pregnancy. Immunization is recommended for any  person with an immunocompromised condition through the age of 74 years if she did not get any or all doses earlier. During the 3-dose series, the second dose should be obtained 4-8 weeks after the first dose. The third dose should be obtained 24 weeks after the first dose and 16 weeks after the second dose.  Zoster vaccine. One dose is recommended for adults aged 34 years or older unless certain conditions are present.  Measles, mumps, and rubella (MMR) vaccine. Adults born before [birth year] generally are considered immune to measles and mumps. Adults born in [birth year] or later should have 1 or more doses of MMR vaccine unless there is a contraindication to the vaccine or there is laboratory evidence of immunity to each of the three diseases. A routine second dose of MMR vaccine should be obtained at least 28 days after the first dose for students attending postsecondary schools, health care workers, or international travelers. People who received inactivated measles vaccine or an unknown type of measles vaccine during 1963-1967 should receive 2 doses of MMR vaccine. People who received inactivated mumps vaccine or an unknown type of mumps vaccine before 1979 and are at high risk for mumps infection should consider immunization with 2 doses of MMR vaccine. For females of childbearing age, rubella immunity should be determined. If there is no evidence of immunity, females who are not pregnant should be vaccinated. If there is no evidence of immunity, females who are pregnant should delay immunization until after pregnancy. Unvaccinated health care workers born before [birth year] who lack  laboratory evidence of measles, mumps, or rubella immunity or laboratory confirmation of disease should consider measles and mumps immunization with 2 doses of MMR vaccine or rubella immunization with 1 dose of MMR vaccine.  Pneumococcal 13-valent conjugate (PCV13) vaccine. When indicated, a person who is uncertain of his immunization history and has no record of immunization should receive the PCV13 vaccine. All adults 3 years of age and older should receive this vaccine. An adult aged 61 years or older who has certain medical conditions and has not been previously immunized should receive 1 dose of PCV13 vaccine. This PCV13 should be followed with a dose of pneumococcal polysaccharide (PPSV23) vaccine. Adults who are at high risk for pneumococcal disease should obtain the PPSV23 vaccine at least 8 weeks after the dose of PCV13 vaccine. Adults older than 46 years of age who have normal immune system function should obtain the PPSV23 vaccine dose at least 1 year after the dose of PCV13 vaccine.  Pneumococcal polysaccharide (PPSV23) vaccine. When PCV13 is also indicated, PCV13 should be obtained first. All adults aged 18 years and older should be immunized. An adult younger than age 40 years who has certain medical conditions should be immunized. Any person who resides in a nursing home or long-term care facility should be immunized. An adult smoker should be immunized. People with an immunocompromised condition and certain other conditions should receive both PCV13 and PPSV23 vaccines. People with human immunodeficiency virus (HIV) infection should be immunized as soon as possible after diagnosis. Immunization during chemotherapy or radiation therapy should be avoided. Routine use of PPSV23 vaccine is not recommended for American Indians, [place] Natives, or people younger than 65 years unless there are medical conditions that require PPSV23 vaccine. When indicated, people who have unknown immunization and have  no record of immunization should receive PPSV23 vaccine. One-time revaccination 5 years after the first dose of PPSV23 is recommended for people aged 19-64 years who have chronic kidney failure,  nephrotic syndrome, asplenia, or immunocompromised conditions. People who received 1-2 doses of PPSV23 before age 2 years should receive another dose of PPSV23 vaccine at age 33 years or later if at least 5 years have passed since the previous dose. Doses of PPSV23 are not needed for people immunized with PPSV23 at or after age 19 years.  Meningococcal vaccine. Adults with asplenia or persistent complement component deficiencies should receive 2 doses of quadrivalent meningococcal conjugate (MenACWY-D) vaccine. The doses should be obtained at least 2 months apart. Microbiologists working with certain meningococcal bacteria, [employer] recruits, people at risk during an outbreak, and people who travel to or live in countries with a high rate of meningitis should be immunized. A first-year college student up through age 29 years who is living in a residence hall should receive a dose if she did not receive a dose on or after her 16th birthday. Adults who have certain high-risk conditions should receive one or more doses of vaccine.  Hepatitis A vaccine. Adults who wish to be protected from this disease, have certain high-risk conditions, work with hepatitis A-infected animals, work in hepatitis A research labs, or travel to or work in countries with a high rate of hepatitis A should be immunized. Adults who were previously unvaccinated and who anticipate close contact with an international adoptee during the first 60 days after arrival in the [place] from a country with a high rate of hepatitis A should be immunized.  Hepatitis B vaccine. Adults who wish to be protected from this disease, have certain high-risk conditions, may be exposed to blood or other infectious body fluids, are household contacts or sex  partners of hepatitis B positive people, are clients or workers in certain care facilities, or travel to or work in countries with a high rate of hepatitis B should be immunized.  Haemophilus influenzae type b (Hib) vaccine. A previously unvaccinated person with asplenia or sickle cell disease or having a scheduled splenectomy should receive 1 dose of Hib vaccine. Regardless of previous immunization, a recipient of a hematopoietic stem cell transplant should receive a 3-dose series 6-12 months after her successful transplant. Hib vaccine is not recommended for adults with HIV infection. Preventive Services / Frequency Ages 61 to 53 years  Blood pressure check.** / Every 3-5 years.  Lipid and cholesterol check.** / Every 5 years beginning at age 71.  Clinical breast exam.** / Every 3 years for women in their 67s and 44s.  BRCA-related cancer risk assessment.** / For women who have family members with a BRCA-related cancer (breast, ovarian, tubal, or peritoneal cancers).  Pap test.** / Every 2 years from ages 65 through 81. Every 3 years starting at age 45 through age 42 or 75 with a history of 3 consecutive normal Pap tests.  HPV screening.** / Every 3 years from ages 78 through ages 55 to 62 with a history of 3 consecutive normal Pap tests.  Hepatitis C blood test.** / For any individual with known risks for hepatitis C.  Skin self-exam. / Monthly.  Influenza vaccine. / Every year.  Tetanus, diphtheria, and acellular pertussis (Tdap, Td) vaccine.** / Consult your health care provider. Pregnant women should receive 1 dose of Tdap vaccine during each pregnancy. 1 dose of Td every 10 years.  Varicella vaccine.** / Consult your health care provider. Pregnant females who do not have evidence of immunity should receive the first dose after pregnancy.  HPV vaccine. / 3 doses over 6 months, if 35 and younger. The  vaccine is not recommended for use in pregnant females. However, pregnancy testing  is not needed before receiving a dose.  Measles, mumps, rubella (MMR) vaccine.** / You need at least 1 dose of MMR if you were born in 1957 or later. You may also need a 2nd dose. For females of childbearing age, rubella immunity should be determined. If there is no evidence of immunity, females who are not pregnant should be vaccinated. If there is no evidence of immunity, females who are pregnant should delay immunization until after pregnancy.  Pneumococcal 13-valent conjugate (PCV13) vaccine.** / Consult your health care provider.  Pneumococcal polysaccharide (PPSV23) vaccine.** / 1 to 2 doses if you smoke cigarettes or if you have certain conditions.  Meningococcal vaccine.** / 1 dose if you are age 36 to 15 years and a Market researcher living in a residence hall, or have one of several medical conditions, you need to get vaccinated against meningococcal disease. You may also need additional booster doses.  Hepatitis A vaccine.** / Consult your health care provider.  Hepatitis B vaccine.** / Consult your health care provider.  Haemophilus influenzae type b (Hib) vaccine.** / Consult your health care provider. Ages 41 to 53 years  Blood pressure check.** / Every year.  Lipid and cholesterol check.** / Every 5 years beginning at age 25 years.  Lung cancer screening. / Every year if you are aged 24-80 years and have a 30-pack-year history of smoking and currently smoke or have quit within the past 15 years. Yearly screening is stopped once you have quit smoking for at least 15 years or develop a health problem that would prevent you from having lung cancer treatment.  Clinical breast exam.** / Every year after age 47 years.  BRCA-related cancer risk assessment.** / For women who have family members with a BRCA-related cancer (breast, ovarian, tubal, or peritoneal cancers).  Mammogram.** / Every year beginning at age 46 years and continuing for as long as you are in good  health. Consult with your health care provider.  Pap test.** / Every 3 years starting at age 46 years through age 59 or 32 years with a history of 3 consecutive normal Pap tests.  HPV screening.** / Every 3 years from ages 67 years through ages 26 to 75 years with a history of 3 consecutive normal Pap tests.  Fecal occult blood test (FOBT) of stool. / Every year beginning at age 41 years and continuing until age 67 years. You may not need to do this test if you get a colonoscopy every 10 years.  Flexible sigmoidoscopy or colonoscopy.** / Every 5 years for a flexible sigmoidoscopy or every 10 years for a colonoscopy beginning at age 5 years and continuing until age 10 years.  Hepatitis C blood test.** / For all people born from [birth year] through 1966 and any individual with known risks for hepatitis C.  Skin self-exam. / Monthly.  Influenza vaccine. / Every year.  Tetanus, diphtheria, and acellular pertussis (Tdap/Td) vaccine.** / Consult your health care provider. Pregnant women should receive 1 dose of Tdap vaccine during each pregnancy. 1 dose of Td every 10 years.  Varicella vaccine.** / Consult your health care provider. Pregnant females who do not have evidence of immunity should receive the first dose after pregnancy.  Zoster vaccine.** / 1 dose for adults aged 69 years or older.  Measles, mumps, rubella (MMR) vaccine.** / You need at least 1 dose of MMR if you were born in 1957 or later. You  may also need a second dose. For females of childbearing age, rubella immunity should be determined. If there is no evidence of immunity, females who are not pregnant should be vaccinated. If there is no evidence of immunity, females who are pregnant should delay immunization until after pregnancy.  Pneumococcal 13-valent conjugate (PCV13) vaccine.** / Consult your health care provider.  Pneumococcal polysaccharide (PPSV23) vaccine.** / 1 to 2 doses if you smoke cigarettes or if you have certain  conditions.  Meningococcal vaccine.** / Consult your health care provider.  Hepatitis A vaccine.** / Consult your health care provider.  Hepatitis B vaccine.** / Consult your health care provider.  Haemophilus influenzae type b (Hib) vaccine.** / Consult your health care provider. Ages 30 years and over  Blood pressure check.** / Every year.  Lipid and cholesterol check.** / Every 5 years beginning at age 31 years.  Lung cancer screening. / Every year if you are aged 37-80 years and have a 30-pack-year history of smoking and currently smoke or have quit within the past 15 years. Yearly screening is stopped once you have quit smoking for at least 15 years or develop a health problem that would prevent you from having lung cancer treatment.  Clinical breast exam.** / Every year after age 71 years.  BRCA-related cancer risk assessment.** / For women who have family members with a BRCA-related cancer (breast, ovarian, tubal, or peritoneal cancers).  Mammogram.** / Every year beginning at age 86 years and continuing for as long as you are in good health. Consult with your health care provider.  Pap test.** / Every 3 years starting at age 9 years through age 32 or 23 years with 3 consecutive normal Pap tests. Testing can be stopped between 65 and 70 years with 3 consecutive normal Pap tests and no abnormal Pap or HPV tests in the past 10 years.  HPV screening.** / Every 3 years from ages 14 years through ages 28 or 8 years with a history of 3 consecutive normal Pap tests. Testing can be stopped between 65 and 70 years with 3 consecutive normal Pap tests and no abnormal Pap or HPV tests in the past 10 years.  Fecal occult blood test (FOBT) of stool. / Every year beginning at age 102 years and continuing until age 52 years. You may not need to do this test if you get a colonoscopy every 10 years.  Flexible sigmoidoscopy or colonoscopy.** / Every 5 years for a flexible sigmoidoscopy or every 10  years for a colonoscopy beginning at age 40 years and continuing until age 75 years.  Hepatitis C blood test.** / For all people born from [birth year] through 1966 and any individual with known risks for hepatitis C.  Osteoporosis screening.** / A one-time screening for women ages 44 years and over and women at risk for fractures or osteoporosis.  Skin self-exam. / Monthly.  Influenza vaccine. / Every year.  Tetanus, diphtheria, and acellular pertussis (Tdap/Td) vaccine.** / 1 dose of Td every 10 years.  Varicella vaccine.** / Consult your health care provider.  Zoster vaccine.** / 1 dose for adults aged 65 years or older.  Pneumococcal 13-valent conjugate (PCV13) vaccine.** / Consult your health care provider.  Pneumococcal polysaccharide (PPSV23) vaccine.** / 1 dose for all adults aged 24 years and older.  Meningococcal vaccine.** / Consult your health care provider.  Hepatitis A vaccine.** / Consult your health care provider.  Hepatitis B vaccine.** / Consult your health care provider.  Haemophilus influenzae type b (Hib) vaccine.** /  Consult your health care provider. ** Family history and personal history of risk and conditions may change your health care provider's recommendations.   This information is not intended to replace advice given to you by your health care provider. Make sure you discuss any questions you have with your health care provider.   Document Released: 09/03/2001 Document Revised: 07/29/2014 Document Reviewed: 12/03/2010 Elsevier Interactive Patient Education [employer].

## 2016-06-03 NOTE — Progress Notes (Signed)
Pre visit review using our clinic review tool, if applicable. No additional management support is needed unless otherwise documented below in the visit note. 

## 2016-06-07 ENCOUNTER — Encounter: Payer: BLUE CROSS/BLUE SHIELD | Admitting: Physician Assistant

## 2016-06-08 ENCOUNTER — Encounter: Payer: Self-pay | Admitting: Physician Assistant

## 2016-06-10 DIAGNOSIS — M71572 Other bursitis, not elsewhere classified, left ankle and foot: Secondary | ICD-10-CM | POA: Diagnosis not present

## 2016-06-10 DIAGNOSIS — M722 Plantar fascial fibromatosis: Secondary | ICD-10-CM | POA: Diagnosis not present

## 2016-06-11 ENCOUNTER — Telehealth: Payer: Self-pay | Admitting: Emergency Medicine

## 2016-06-11 NOTE — Telephone Encounter (Signed)
Initiated a Prior Authorization for Belviq 10 mg on 06/10/16 thru [employer].  06/11/2016 approved 05/12/16-09/09/16 Case Id: [id number]  Notified patient and pharmacy of approval

## 2016-06-19 DIAGNOSIS — Z1231 Encounter for screening mammogram for malignant neoplasm of breast: Secondary | ICD-10-CM | POA: Diagnosis not present

## 2016-06-21 HISTORY — PX: OTHER SURGICAL HISTORY: SHX169

## 2016-07-05 ENCOUNTER — Encounter: Payer: Self-pay | Admitting: Physician Assistant

## 2016-07-09 ENCOUNTER — Encounter: Payer: Self-pay | Admitting: Physician Assistant

## 2016-07-09 ENCOUNTER — Ambulatory Visit (INDEPENDENT_AMBULATORY_CARE_PROVIDER_SITE_OTHER): Payer: BLUE CROSS/BLUE SHIELD | Admitting: Physician Assistant

## 2016-07-09 VITALS — BP 110/80 | HR 76 | Temp 98.0°F | Resp 16 | Ht 62.0 in | Wt 196.0 lb

## 2016-07-09 DIAGNOSIS — S93402A Sprain of unspecified ligament of left ankle, initial encounter: Secondary | ICD-10-CM

## 2016-07-09 DIAGNOSIS — L219 Seborrheic dermatitis, unspecified: Secondary | ICD-10-CM | POA: Diagnosis not present

## 2016-07-09 DIAGNOSIS — M5020 Other cervical disc displacement, unspecified cervical region: Secondary | ICD-10-CM | POA: Diagnosis not present

## 2016-07-09 NOTE — Progress Notes (Signed)
Pre visit review using our clinic review tool, if applicable. No additional management support is needed unless otherwise documented below in the visit note. 

## 2016-07-09 NOTE — Patient Instructions (Signed)
Please wear the ankle brace as directed over the next week. Always wear supportive foot wear. Elevate leg while resting. Apply [employer] [facility] or Aspercreme to the area.  Tylenol or Ibuprofen if needed for pain.  Follow-up if not resolving within the next 1-2 weeks.

## 2016-07-09 NOTE — Progress Notes (Signed)
Patient presents to clinic today c/o pain of left lateral ankle after twisting ankle on Thanksgiving day while walking outside. Denies any bruising, swelling, numbness or tingling. Endorses symptoms much improved but there is still a soreness noted.  Past Medical History:  Diagnosis Date  . History of chicken pox   . Venous thrombosis of leg    Left    Current Outpatient Prescriptions on File Prior to Visit  Medication Sig Dispense Refill  . aspirin 81 MG tablet Take 81 mg by mouth daily.    Marland Kitchen CAMILA 0.35 MG tablet Take 1 tablet by mouth daily.  3  . Lorcaserin HCl (BELVIQ) 10 MG TABS Take 10 mg by mouth 2 (two) times daily. 60 tablet 1  . Multiple Vitamin (MULTIVITAMIN) tablet Take 1 tablet by mouth as directed.     No current facility-administered medications on file prior to visit.     Allergies  Allergen Reactions  . Amoxil [Amoxicillin] Hives    Family History  Problem Relation Age of Onset  . Brain cancer Mother 56    Deceased-Tumor  . Glaucoma Maternal Grandfather   . Glaucoma Maternal Aunt   . Cancer Maternal Grandmother   . Lung cancer Maternal Aunt   . Healthy Sister     x1    Social History   Social History  . Marital status: Single    Spouse name: N/A  . Number of children: N/A  . Years of education: N/A   Social History Main Topics  . Smoking status: Never Smoker  . Smokeless tobacco: Never Used  . Alcohol use 0.0 oz/week     Comment: rare  . Drug use: No  . Sexual activity: Yes    Partners: Male    Birth control/ protection: Pill     Comment: boyfriend   Other Topics Concern  . None   Social History Narrative  . None    Review of Systems - See HPI.  All other ROS are negative.  BP 110/80   Pulse 76   Temp 98 F (36.7 C) (Oral)   Resp 16   Ht 5\' 2"  (1.575 m)   Wt 196 lb (88.9 kg)   SpO2 100%   BMI 35.85 kg/m   Physical Exam  Constitutional: She is well-developed, well-nourished, and in no distress.  HENT:  Head:  Normocephalic and atraumatic.  Eyes: Conjunctivae are normal.  Neck: Neck supple.  Cardiovascular: Normal rate, regular rhythm, normal heart sounds and intact distal pulses.   Pulmonary/Chest: Effort normal and breath sounds normal. No respiratory distress. She has no wheezes. She has no rales. She exhibits no tenderness.  Musculoskeletal:       Left ankle: She exhibits normal range of motion and no swelling. Tenderness. AITFL tenderness found. No medial malleolus tenderness found. Achilles tendon normal.  Skin: Skin is warm and dry. No rash noted.  Psychiatric: Affect normal.  Vitals reviewed.  Recent Results (from the past 2160 hour(s))  CBC     Status: None   Collection Time: 06/03/16 10:12 AM  Result Value Ref Range   WBC 8.0 4.0 - 10.5 K/uL   RBC 4.99 3.87 - 5.11 Mil/uL   Platelets 271.0 150.0 - 400.0 K/uL   Hemoglobin 14.9 12.0 - 15.0 g/dL   HCT 44.2 36.0 - 46.0 %   MCV 88.8 78.0 - 100.0 fl   MCHC 33.7 30.0 - 36.0 g/dL   RDW 13.5 11.5 - 15.5 %  Comprehensive metabolic panel  Status: None   Collection Time: 06/03/16 10:12 AM  Result Value Ref Range   Sodium 139 135 - 145 mEq/L   Potassium 4.5 3.5 - 5.1 mEq/L   Chloride 104 96 - 112 mEq/L   CO2 30 19 - 32 mEq/L   Glucose, Bld 95 70 - 99 mg/dL   BUN 11 6 - 23 mg/dL   Creatinine, Ser 0.64 0.40 - 1.20 mg/dL   Total Bilirubin 0.7 0.2 - 1.2 mg/dL   Alkaline Phosphatase 86 39 - 117 U/L   AST 22 0 - 37 U/L   ALT 26 0 - 35 U/L   Total Protein 6.2 6.0 - 8.3 g/dL   Albumin 3.8 3.5 - 5.2 g/dL   Calcium 9.0 8.4 - 10.5 mg/dL   GFR 106.02 >60.00 mL/min  Hemoglobin A1c     Status: None   Collection Time: 06/03/16 10:12 AM  Result Value Ref Range   Hgb A1c MFr Bld 5.7 4.6 - 6.5 %    Comment: Glycemic Control Guidelines for People with Diabetes:Non Diabetic:  <6%Goal of Therapy: <7%Additional Action Suggested:  >8%   Lipid panel     Status: None   Collection Time: 06/03/16 10:12 AM  Result Value Ref Range   Cholesterol 154 0 -  200 mg/dL    Comment: ATP III Classification       Desirable:  < 200 mg/dL               Borderline High:  200 - 239 mg/dL          High:  > = 240 mg/dL   Triglycerides 110.0 0.0 - 149.0 mg/dL    Comment: Normal:  <150 mg/dLBorderline High:  150 - 199 mg/dL   HDL 50.40 >39.00 mg/dL   VLDL 22.0 0.0 - 40.0 mg/dL   LDL Cholesterol 82 0 - 99 mg/dL   Total CHOL/HDL Ratio 3     Comment:                Men          Women1/2 Average Risk     3.4          3.3Average Risk          5.0          4.42X Average Risk          9.6          7.13X Average Risk          15.0          11.0                       NonHDL 103.80     Comment: NOTE:  Non-HDL goal should be 30 mg/dL higher than patient's LDL goal (i.e. LDL goal of < 70 mg/dL, would have non-HDL goal of < 100 mg/dL)  TSH     Status: None   Collection Time: 06/03/16 10:12 AM  Result Value Ref Range   TSH 1.05 0.35 - 4.50 uIU/mL  POCT Urinalysis Dipstick     Status: Abnormal   Collection Time: 06/03/16 11:01 AM  Result Value Ref Range   Color, UA yellow    Clarity, UA clear    Glucose, UA negative    Bilirubin, UA negative    Ketones, UA negative    Spec Grav, UA 1.025    Blood, UA trace    pH, UA 6.0    Protein, UA negative  Urobilinogen, UA 0.2    Nitrite, UA negative    Leukocytes, UA Negative Negative    Assessment/Plan: 1. Sprain of left ankle, unspecified ligament, initial encounter Mild. Healing. Discussed this can sometime take time. Ankle brace applied for stability. RICE. Tylenol or Ibuprofen. FU if not resolving.    Leeanne Rio, PA-C

## 2016-07-18 DIAGNOSIS — M50022 Cervical disc disorder at C5-C6 level with myelopathy: Secondary | ICD-10-CM | POA: Diagnosis not present

## 2016-07-18 DIAGNOSIS — M50222 Other cervical disc displacement at C5-C6 level: Secondary | ICD-10-CM | POA: Diagnosis not present

## 2016-08-09 DIAGNOSIS — M5020 Other cervical disc displacement, unspecified cervical region: Secondary | ICD-10-CM | POA: Diagnosis not present

## 2016-11-15 DIAGNOSIS — M25512 Pain in left shoulder: Secondary | ICD-10-CM | POA: Diagnosis not present

## 2016-11-15 DIAGNOSIS — R030 Elevated blood-pressure reading, without diagnosis of hypertension: Secondary | ICD-10-CM | POA: Diagnosis not present

## 2016-11-15 DIAGNOSIS — M542 Cervicalgia: Secondary | ICD-10-CM | POA: Diagnosis not present

## 2016-11-15 DIAGNOSIS — Z6838 Body mass index (BMI) 38.0-38.9, adult: Secondary | ICD-10-CM | POA: Diagnosis not present

## 2016-11-29 DIAGNOSIS — M546 Pain in thoracic spine: Secondary | ICD-10-CM | POA: Diagnosis not present

## 2016-11-29 DIAGNOSIS — M2560 Stiffness of unspecified joint, not elsewhere classified: Secondary | ICD-10-CM | POA: Diagnosis not present

## 2016-11-29 DIAGNOSIS — S233XXD Sprain of ligaments of thoracic spine, subsequent encounter: Secondary | ICD-10-CM | POA: Diagnosis not present

## 2016-11-29 DIAGNOSIS — M25312 Other instability, left shoulder: Secondary | ICD-10-CM | POA: Diagnosis not present

## 2016-12-02 DIAGNOSIS — M546 Pain in thoracic spine: Secondary | ICD-10-CM | POA: Diagnosis not present

## 2016-12-02 DIAGNOSIS — M2560 Stiffness of unspecified joint, not elsewhere classified: Secondary | ICD-10-CM | POA: Diagnosis not present

## 2016-12-02 DIAGNOSIS — S233XXD Sprain of ligaments of thoracic spine, subsequent encounter: Secondary | ICD-10-CM | POA: Diagnosis not present

## 2016-12-02 DIAGNOSIS — M25312 Other instability, left shoulder: Secondary | ICD-10-CM | POA: Diagnosis not present

## 2016-12-06 DIAGNOSIS — M2560 Stiffness of unspecified joint, not elsewhere classified: Secondary | ICD-10-CM | POA: Diagnosis not present

## 2016-12-06 DIAGNOSIS — S233XXD Sprain of ligaments of thoracic spine, subsequent encounter: Secondary | ICD-10-CM | POA: Diagnosis not present

## 2016-12-06 DIAGNOSIS — M546 Pain in thoracic spine: Secondary | ICD-10-CM | POA: Diagnosis not present

## 2016-12-06 DIAGNOSIS — M25312 Other instability, left shoulder: Secondary | ICD-10-CM | POA: Diagnosis not present

## 2016-12-10 DIAGNOSIS — M25312 Other instability, left shoulder: Secondary | ICD-10-CM | POA: Diagnosis not present

## 2016-12-10 DIAGNOSIS — M2560 Stiffness of unspecified joint, not elsewhere classified: Secondary | ICD-10-CM | POA: Diagnosis not present

## 2016-12-10 DIAGNOSIS — S233XXD Sprain of ligaments of thoracic spine, subsequent encounter: Secondary | ICD-10-CM | POA: Diagnosis not present

## 2016-12-10 DIAGNOSIS — M546 Pain in thoracic spine: Secondary | ICD-10-CM | POA: Diagnosis not present

## 2016-12-13 DIAGNOSIS — M546 Pain in thoracic spine: Secondary | ICD-10-CM | POA: Diagnosis not present

## 2016-12-13 DIAGNOSIS — M25312 Other instability, left shoulder: Secondary | ICD-10-CM | POA: Diagnosis not present

## 2016-12-13 DIAGNOSIS — S233XXD Sprain of ligaments of thoracic spine, subsequent encounter: Secondary | ICD-10-CM | POA: Diagnosis not present

## 2016-12-13 DIAGNOSIS — M2560 Stiffness of unspecified joint, not elsewhere classified: Secondary | ICD-10-CM | POA: Diagnosis not present

## 2016-12-20 DIAGNOSIS — M546 Pain in thoracic spine: Secondary | ICD-10-CM | POA: Diagnosis not present

## 2016-12-20 DIAGNOSIS — M2560 Stiffness of unspecified joint, not elsewhere classified: Secondary | ICD-10-CM | POA: Diagnosis not present

## 2016-12-20 DIAGNOSIS — M25312 Other instability, left shoulder: Secondary | ICD-10-CM | POA: Diagnosis not present

## 2016-12-20 DIAGNOSIS — S233XXD Sprain of ligaments of thoracic spine, subsequent encounter: Secondary | ICD-10-CM | POA: Diagnosis not present

## 2016-12-23 DIAGNOSIS — S233XXD Sprain of ligaments of thoracic spine, subsequent encounter: Secondary | ICD-10-CM | POA: Diagnosis not present

## 2016-12-23 DIAGNOSIS — M2560 Stiffness of unspecified joint, not elsewhere classified: Secondary | ICD-10-CM | POA: Diagnosis not present

## 2016-12-23 DIAGNOSIS — M546 Pain in thoracic spine: Secondary | ICD-10-CM | POA: Diagnosis not present

## 2016-12-23 DIAGNOSIS — M25312 Other instability, left shoulder: Secondary | ICD-10-CM | POA: Diagnosis not present

## 2016-12-27 DIAGNOSIS — S233XXD Sprain of ligaments of thoracic spine, subsequent encounter: Secondary | ICD-10-CM | POA: Diagnosis not present

## 2016-12-27 DIAGNOSIS — M2560 Stiffness of unspecified joint, not elsewhere classified: Secondary | ICD-10-CM | POA: Diagnosis not present

## 2016-12-27 DIAGNOSIS — M25312 Other instability, left shoulder: Secondary | ICD-10-CM | POA: Diagnosis not present

## 2016-12-27 DIAGNOSIS — M546 Pain in thoracic spine: Secondary | ICD-10-CM | POA: Diagnosis not present

## 2016-12-30 DIAGNOSIS — M2560 Stiffness of unspecified joint, not elsewhere classified: Secondary | ICD-10-CM | POA: Diagnosis not present

## 2016-12-30 DIAGNOSIS — M546 Pain in thoracic spine: Secondary | ICD-10-CM | POA: Diagnosis not present

## 2016-12-30 DIAGNOSIS — M25312 Other instability, left shoulder: Secondary | ICD-10-CM | POA: Diagnosis not present

## 2016-12-30 DIAGNOSIS — S233XXD Sprain of ligaments of thoracic spine, subsequent encounter: Secondary | ICD-10-CM | POA: Diagnosis not present

## 2017-01-03 DIAGNOSIS — M25312 Other instability, left shoulder: Secondary | ICD-10-CM | POA: Diagnosis not present

## 2017-01-03 DIAGNOSIS — M546 Pain in thoracic spine: Secondary | ICD-10-CM | POA: Diagnosis not present

## 2017-01-03 DIAGNOSIS — S233XXD Sprain of ligaments of thoracic spine, subsequent encounter: Secondary | ICD-10-CM | POA: Diagnosis not present

## 2017-01-03 DIAGNOSIS — M2560 Stiffness of unspecified joint, not elsewhere classified: Secondary | ICD-10-CM | POA: Diagnosis not present

## 2017-03-20 DIAGNOSIS — Z124 Encounter for screening for malignant neoplasm of cervix: Secondary | ICD-10-CM | POA: Diagnosis not present

## 2017-03-20 DIAGNOSIS — Z01419 Encounter for gynecological examination (general) (routine) without abnormal findings: Secondary | ICD-10-CM | POA: Diagnosis not present

## 2017-03-20 DIAGNOSIS — Z6839 Body mass index (BMI) 39.0-39.9, adult: Secondary | ICD-10-CM | POA: Diagnosis not present

## 2017-04-01 NOTE — Progress Notes (Signed)
Patient presents to clinic today for annual exam.  Patient is fasting for labs.  Acute Concerns: Weight loss. Started three weeks ago eating really well with portions, home cooking, if fast food gets a salad. Started belvic a couple of weeks ago. Has 1/2 prescription left. Is exercising on the elliptical 90 minutes per week.   Health Maintenance: Immunizations -- Tetanus up-to-date. Patient is getting flu shot at work.  Mammogram -- Scheduled in October. PAP -- Just had with GYN. Awaiting results.  Past Medical History:  Diagnosis Date  . History of chicken pox   . Venous thrombosis of leg    Left    Past Surgical History:  Procedure Laterality Date  . BREAST SURGERY     Fibroidadenoma-Left  . CHOLECYSTECTOMY    . REFRACTIVE SURGERY     Bilateral  . ruptured disc    . TONSILLECTOMY    . WISDOM TOOTH EXTRACTION      Current Outpatient Prescriptions on File Prior to Visit  Medication Sig Dispense Refill  . CAMILA 0.35 MG tablet Take 1 tablet by mouth daily.  3  . Lorcaserin HCl (BELVIQ) 10 MG TABS Take 10 mg by mouth 2 (two) times daily. 60 tablet 1  . Multiple Vitamin (MULTIVITAMIN) tablet Take 1 tablet by mouth as directed.    Marland Kitchen aspirin 81 MG tablet Take 81 mg by mouth daily.     No current facility-administered medications on file prior to visit.     Allergies  Allergen Reactions  . Amoxil [Amoxicillin] Hives    Family History  Problem Relation Age of Onset  . Brain cancer Mother 82       Deceased-Tumor  . Glaucoma Maternal Grandfather   . Glaucoma Maternal Aunt   . Cancer Maternal Grandmother   . Lung cancer Maternal Aunt   . Healthy Sister        x1    Social History   Social History  . Marital status: Single    Spouse name: N/A  . Number of children: N/A  . Years of education: N/A   Occupational History  . Not on file.   Social History Main Topics  . Smoking status: Never Smoker  . Smokeless tobacco: Never Used  . Alcohol use 0.0  oz/week     Comment: rare  . Drug use: No  . Sexual activity: Yes    Partners: Male    Birth control/ protection: Pill     Comment: boyfriend   Other Topics Concern  . Not on file   Social History Narrative  . No narrative on file   Review of Systems  Constitutional: Positive for malaise/fatigue. Negative for fever and weight loss.  HENT: Negative for ear discharge, ear pain, hearing loss and tinnitus.   Eyes: Negative for blurred vision, double vision, photophobia and pain.  Respiratory: Negative for cough and shortness of breath.   Cardiovascular: Negative for chest pain and palpitations.  Gastrointestinal: Negative for abdominal pain, blood in stool, constipation, diarrhea, heartburn, melena, nausea and vomiting.  Genitourinary: Negative for dysuria, flank pain, frequency, hematuria and urgency.  Musculoskeletal: Negative for falls.  Skin: Negative for rash.  Neurological: Positive for headaches. Negative for dizziness and loss of consciousness.  Endo/Heme/Allergies: Negative for environmental allergies.  Psychiatric/Behavioral: Negative for depression, hallucinations, substance abuse and suicidal ideas. The patient is not nervous/anxious and does not have insomnia.    BP 112/80   Pulse 66   Temp 98.1 F (36.7 C) (Oral)  Resp 14   Ht 5\' 2"  (1.575 m)   Wt 209 lb (94.8 kg)   SpO2 98%   BMI 38.23 kg/m   Physical Exam  Constitutional: She is oriented to person, place, and time and well-developed, well-nourished, and in no distress.  HENT:  Head: Normocephalic and atraumatic.  Right Ear: Tympanic membrane, external ear and ear canal normal.  Left Ear: Tympanic membrane, external ear and ear canal normal.  Nose: Nose normal. No mucosal edema.  Mouth/Throat: Uvula is midline, oropharynx is clear and moist and mucous membranes are normal. No oropharyngeal exudate or posterior oropharyngeal erythema.  Eyes: Pupils are equal, round, and reactive to light. Conjunctivae are  normal.  Neck: Neck supple. No thyromegaly present.  Cardiovascular: Normal rate, regular rhythm, normal heart sounds and intact distal pulses.   Pulmonary/Chest: Effort normal and breath sounds normal. No respiratory distress. She has no wheezes. She has no rales.  Abdominal: Soft. Bowel sounds are normal. She exhibits no distension and no mass. There is no tenderness. There is no rebound and no guarding.  Lymphadenopathy:    She has no cervical adenopathy.  Neurological: She is alert and oriented to person, place, and time. No cranial nerve deficit.  Skin: Skin is warm and dry. No rash noted.  Psychiatric: Affect normal.  Vitals reviewed.  Assessment/Plan: Visit for preventive health examination Depression screen negative. Health Maintenance reviewed -- Mammogram scheduled. PAP completed. Will get results from GYN. Flu shot at work. Tetanus up-to-date. Preventive schedule discussed and handout given in AVS. Will obtain fasting labs today.    Obesity Discussed dietary and exercise recommendations/goals. Will increase aerobic activity to at least 150 minutes per week. Continue 13-month supply of Belviq. If not major improvement will try alternative medication.    Leeanne Rio, PA-C

## 2017-04-02 ENCOUNTER — Encounter: Payer: Self-pay | Admitting: Physician Assistant

## 2017-04-02 ENCOUNTER — Other Ambulatory Visit (INDEPENDENT_AMBULATORY_CARE_PROVIDER_SITE_OTHER): Payer: BLUE CROSS/BLUE SHIELD

## 2017-04-02 ENCOUNTER — Ambulatory Visit (INDEPENDENT_AMBULATORY_CARE_PROVIDER_SITE_OTHER): Payer: BLUE CROSS/BLUE SHIELD | Admitting: Physician Assistant

## 2017-04-02 VITALS — BP 112/80 | HR 66 | Temp 98.1°F | Resp 14 | Ht 62.0 in | Wt 209.0 lb

## 2017-04-02 DIAGNOSIS — Z0000 Encounter for general adult medical examination without abnormal findings: Secondary | ICD-10-CM | POA: Diagnosis not present

## 2017-04-02 DIAGNOSIS — Z6836 Body mass index (BMI) 36.0-36.9, adult: Secondary | ICD-10-CM

## 2017-04-02 DIAGNOSIS — E669 Obesity, unspecified: Secondary | ICD-10-CM

## 2017-04-02 DIAGNOSIS — E668 Other obesity: Secondary | ICD-10-CM

## 2017-04-02 LAB — TSH: TSH: 1.4 u[IU]/mL (ref 0.35–4.50)

## 2017-04-02 LAB — COMPREHENSIVE METABOLIC PANEL
ALT: 20 U/L (ref 0–35)
AST: 19 U/L (ref 0–37)
Albumin: 3.7 g/dL (ref 3.5–5.2)
Alkaline Phosphatase: 65 U/L (ref 39–117)
BUN: 17 mg/dL (ref 6–23)
CO2: 26 mEq/L (ref 19–32)
Calcium: 8.8 mg/dL (ref 8.4–10.5)
Chloride: 107 mEq/L (ref 96–112)
Creatinine, Ser: 0.7 mg/dL (ref 0.40–1.20)
GFR: 95.26 mL/min (ref >60.00)
Glucose, Bld: 102 mg/dL — ABNORMAL HIGH (ref 70–99)
Potassium: 4.4 mEq/L (ref 3.5–5.1)
Sodium: 141 mEq/L (ref 135–145)
Total Bilirubin: 0.6 mg/dL (ref 0.2–1.2)
Total Protein: 5.9 g/dL — ABNORMAL LOW (ref 6.0–8.3)

## 2017-04-02 LAB — CBC WITH DIFFERENTIAL/PLATELET
Basophils Absolute: 0 10*3/uL (ref 0.0–0.1)
Basophils Relative: 0.8 % (ref 0.0–3.0)
Eosinophils Absolute: 0.1 10*3/uL (ref 0.0–0.7)
Eosinophils Relative: 2.8 % (ref 0.0–5.0)
HCT: 45 % (ref 36.0–46.0)
Hemoglobin: 14.8 g/dL (ref 12.0–15.0)
Lymphocytes Relative: 22 % (ref 12.0–46.0)
Lymphs Abs: 1.1 10*3/uL (ref 0.7–4.0)
MCHC: 32.8 g/dL (ref 30.0–36.0)
MCV: 90.3 fl (ref 78.0–100.0)
Monocytes Absolute: 0.5 10*3/uL (ref 0.1–1.0)
Monocytes Relative: 9 % (ref 3.0–12.0)
Neutro Abs: 3.4 10*3/uL (ref 1.4–7.7)
Neutrophils Relative %: 65.4 % (ref 43.0–77.0)
Platelets: 234 10*3/uL (ref 150.0–400.0)
RBC: 4.99 Mil/uL (ref 3.87–5.11)
RDW: 13.2 % (ref 11.5–15.5)
WBC: 5.2 10*3/uL (ref 4.0–10.5)

## 2017-04-02 LAB — LIPID PANEL
Cholesterol: 153 mg/dL (ref 0–200)
HDL: 54.3 mg/dL (ref >39.00)
LDL Cholesterol: 81 mg/dL (ref 0–99)
NonHDL: 98.2
Total CHOL/HDL Ratio: 3
Triglycerides: 84 mg/dL (ref 0.0–149.0)
VLDL: 16.8 mg/dL (ref 0.0–40.0)

## 2017-04-02 LAB — HEMOGLOBIN A1C: Hgb A1c MFr Bld: 5.6 % (ref 4.6–6.5)

## 2017-04-02 LAB — VITAMIN D 25 HYDROXY (VIT D DEFICIENCY, FRACTURES): VITD: 20.18 ng/mL — ABNORMAL LOW (ref 30.00–100.00)

## 2017-04-02 NOTE — Patient Instructions (Signed)
Please go to the lab for blood work.   Our office will call you with your results unless you have chosen to receive results via MyChart.  If your blood work is normal we will follow-up each year for physicals and as scheduled for chronic medical problems.  If anything is abnormal we will treat accordingly and get you in for a follow-up.  Please increase aerobic exercise to a goal of at least 150 minutes per week. Keep up the hard work with diet. Complete entire course of belviq.  Weigh once per week -- same time, same day, same amount of clothing and same scale. Please call me and let me know how things are going.  Please try to implement the recommendation below to help with sleep.  Sleep Hygiene  Do: (1) Go to bed at the same time each day. (2) Get up from bed at the same time each day. (3) Get regular exercise each day, preferably in the morning.  There is goof evidence that regular exercise improves restful sleep.  This includes stretching and aerobic exercise. (4) Get regular exposure to outdoor or bright lights, especially in the late afternoon. (5) Keep the temperature in your bedroom comfortable. (6) Keep the bedroom quiet when sleeping. (7) Keep the bedroom dark enough to facilitate sleep. (8) Use your bed only for sleep and sex. (9) Take medications as directed.  It is helpful to take prescribed sleeping pills 1 hour before bedtime, so they are causing drowsiness when you lie down, or 10 hours before getting up, to avoid daytime drowsiness. (10) Use a relaxation exercise just before going to sleep -- imagery, massage, warm bath. (11) Keep your feet and hands warm.  Wear warm socks and/or mittens or gloves to bed.  Don't: (1) Exercise just before going to bed. (2) Engage in stimulating activity just before bed, such as playing a competitive game, watching an exciting program on television, or having an important discussion with a loved one. (3) Have caffeine in the evening  (coffee, teas, chocolate, sodas, etc.) (4) Read or watch television in bed. (5) Use alcohol to help you sleep. (6) Go to bed too hungry or too full. (7) Take another person's sleeping pills. (8) Take over-the-counter sleeping pills, without your doctor's knowledge.  Tolerance can develop rapidly with these medications.  Diphenhydramine can have serious side effects for elderly patients. (9) Take daytime naps. (10) Command yourself to go to sleep.  This only makes your mind and body more alert.  If you lie awake for more than 20-30 minutes, get up, go to a different room, participate in a quiet activity (Ex - non-excitable reading or television), and then return to bed when you feel sleepy.  Do this as many times during the night as needed.  This may cause you to have a night or two of poor sleep but it will train your brain to know when it is time for sleep.   Preventive Care 40-64 Years, Female Preventive care refers to lifestyle choices and visits with your health care provider that can promote health and wellness. What does preventive care include?  A yearly physical exam. This is also called an annual well check.  Dental exams once or twice a year.  Routine eye exams. Ask your health care provider how often you should have your eyes checked.  Personal lifestyle choices, including: ? Daily care of your teeth and gums. ? Regular physical activity. ? Eating a healthy diet. ? Avoiding tobacco and drug  use. ? Limiting alcohol use. ? Practicing safe sex. ? Taking low-dose aspirin daily starting at age 98. ? Taking vitamin and mineral supplements as recommended by your health care provider. What happens during an annual well check? The services and screenings done by your health care provider during your annual well check will depend on your age, overall health, lifestyle risk factors, and family history of disease. Counseling Your health care provider may ask you questions about  your:  Alcohol use.  Tobacco use.  Drug use.  Emotional well-being.  Home and relationship well-being.  Sexual activity.  Eating habits.  Work and work Astronomer.  Method of birth control.  Menstrual cycle.  Pregnancy history.  Screening You may have the following tests or measurements:  Height, weight, and BMI.  Blood pressure.  Lipid and cholesterol levels. These may be checked every 5 years, or more frequently if you are over 22 years old.  Skin check.  Lung cancer screening. You may have this screening every year starting at age 30 if you have a 30-pack-year history of smoking and currently smoke or have quit within the past 15 years.  Fecal occult blood test (FOBT) of the stool. You may have this test every year starting at age 90.  Flexible sigmoidoscopy or colonoscopy. You may have a sigmoidoscopy every 5 years or a colonoscopy every 10 years starting at age 42.  Hepatitis C blood test.  Hepatitis B blood test.  Sexually transmitted disease (STD) testing.  Diabetes screening. This is done by checking your blood sugar (glucose) after you have not eaten for a while (fasting). You may have this done every 1-3 years.  Mammogram. This may be done every 1-2 years. Talk to your health care provider about when you should start having regular mammograms. This may depend on whether you have a family history of breast cancer.  BRCA-related cancer screening. This may be done if you have a family history of breast, ovarian, tubal, or peritoneal cancers.  Pelvic exam and Pap test. This may be done every 3 years starting at age 12. Starting at age 46, this may be done every 5 years if you have a Pap test in combination with an HPV test.  Bone density scan. This is done to screen for osteoporosis. You may have this scan if you are at high risk for osteoporosis.  Discuss your test results, treatment options, and if necessary, the need for more tests with your health  care provider. Vaccines Your health care provider may recommend certain vaccines, such as:  Influenza vaccine. This is recommended every year.  Tetanus, diphtheria, and acellular pertussis (Tdap, Td) vaccine. You may need a Td booster every 10 years.  Varicella vaccine. You may need this if you have not been vaccinated.  Zoster vaccine. You may need this after age 41.  Measles, mumps, and rubella (MMR) vaccine. You may need at least one dose of MMR if you were born in 1957 or later. You may also need a second dose.  Pneumococcal 13-valent conjugate (PCV13) vaccine. You may need this if you have certain conditions and were not previously vaccinated.  Pneumococcal polysaccharide (PPSV23) vaccine. You may need one or two doses if you smoke cigarettes or if you have certain conditions.  Meningococcal vaccine. You may need this if you have certain conditions.  Hepatitis A vaccine. You may need this if you have certain conditions or if you travel or work in places where you may be exposed to hepatitis A.  Hepatitis B vaccine. You may need this if you have certain conditions or if you travel or work in places where you may be exposed to hepatitis B.  Haemophilus influenzae type b (Hib) vaccine. You may need this if you have certain conditions.  Talk to your health care provider about which screenings and vaccines you need and how often you need them. This information is not intended to replace advice given to you by your health care provider. Make sure you discuss any questions you have with your health care provider. Document Released: 08/04/2015 Document Revised: 03/27/2016 Document Reviewed: 05/09/2015 Elsevier Interactive Patient Education  2017 [employer].

## 2017-04-02 NOTE — Progress Notes (Signed)
Pre visit review using our clinic review tool, if applicable. No additional management support is needed unless otherwise documented below in the visit note. 

## 2017-04-02 NOTE — Assessment & Plan Note (Signed)
Discussed dietary and exercise recommendations/goals. Will increase aerobic activity to at least 150 minutes per week. Continue 51-month supply of Belviq. If not major improvement will try alternative medication.

## 2017-04-02 NOTE — Assessment & Plan Note (Signed)
Depression screen negative. Health Maintenance reviewed -- Mammogram scheduled. PAP completed. Will get results from GYN. Flu shot at work. Tetanus up-to-date. Preventive schedule discussed and handout given in AVS. Will obtain fasting labs today.

## 2017-04-03 ENCOUNTER — Other Ambulatory Visit: Payer: Self-pay | Admitting: Emergency Medicine

## 2017-04-03 DIAGNOSIS — E559 Vitamin D deficiency, unspecified: Secondary | ICD-10-CM

## 2017-04-03 MED ORDER — VITAMIN D (ERGOCALCIFEROL) 1.25 MG (50000 UNIT) PO CAPS: 50000.0000 [IU] | ORAL_CAPSULE | ORAL | 0 refills | Status: DC

## 2017-04-06 ENCOUNTER — Encounter: Payer: Self-pay | Admitting: Physician Assistant

## 2017-05-10 ENCOUNTER — Encounter: Payer: Self-pay | Admitting: Physician Assistant

## 2017-05-10 DIAGNOSIS — Z91018 Allergy to other foods: Secondary | ICD-10-CM

## 2017-05-20 DIAGNOSIS — J3081 Allergic rhinitis due to animal (cat) (dog) hair and dander: Secondary | ICD-10-CM | POA: Diagnosis not present

## 2017-05-20 DIAGNOSIS — H1045 Other chronic allergic conjunctivitis: Secondary | ICD-10-CM | POA: Diagnosis not present

## 2017-05-20 DIAGNOSIS — J309 Allergic rhinitis, unspecified: Secondary | ICD-10-CM | POA: Diagnosis not present

## 2017-05-20 DIAGNOSIS — J301 Allergic rhinitis due to pollen: Secondary | ICD-10-CM | POA: Diagnosis not present

## 2017-06-16 ENCOUNTER — Other Ambulatory Visit: Payer: Self-pay

## 2017-06-16 ENCOUNTER — Ambulatory Visit (INDEPENDENT_AMBULATORY_CARE_PROVIDER_SITE_OTHER): Payer: BLUE CROSS/BLUE SHIELD | Admitting: Physician Assistant

## 2017-06-16 ENCOUNTER — Encounter: Payer: Self-pay | Admitting: Physician Assistant

## 2017-06-16 DIAGNOSIS — Z6836 Body mass index (BMI) 36.0-36.9, adult: Secondary | ICD-10-CM

## 2017-06-16 DIAGNOSIS — E669 Obesity, unspecified: Secondary | ICD-10-CM

## 2017-06-16 MED ORDER — PHENTERMINE-TOPIRAMATE ER 3.75-23 MG PO CP24: 1.0000 | ORAL_CAPSULE | Freq: Every day | ORAL | 0 refills | Status: DC

## 2017-06-16 NOTE — Assessment & Plan Note (Signed)
Will start Qsymia at 3.75-23 mg daily for 2 weeks. Then increase to 7.5-46 mg daily. Follow-up scheduled.

## 2017-06-16 NOTE — Progress Notes (Signed)
Patient presents to clinic today for follow-up of obesity. Patient was started on Belviq at last visit. Had been taking as directed. Initially noted nausea that resolved but denies any change in weight despite use of medication in addition to diet and exercise. Is continuing to walk daily. Is trying to work back in more moderate-intensity aerobic exercise. Would like to discuss other options. Previously did well on phentermine.  Past Medical History:  Diagnosis Date  . History of chicken pox   . Venous thrombosis of leg    Left    Current Outpatient Medications on File Prior to Visit  Medication Sig Dispense Refill  . aspirin 81 MG tablet Take 81 mg by mouth daily.    Marland Kitchen CAMILA 0.35 MG tablet Take 1 tablet by mouth daily.  3  . EPINEPHrine 0.3 mg/0.3 mL IJ SOAJ injection AS DIRECTED PRN SYSTEMIC REACTION INJECTION 30 DAYS  1  . Vitamin D, Ergocalciferol, (DRISDOL) 50000 units CAPS capsule Take 1 capsule (50,000 Units total) by mouth every 7 (seven) days. 12 capsule 0   No current facility-administered medications on file prior to visit.     Allergies  Allergen Reactions  . Amoxil [Amoxicillin] Hives  . Naproxen Rash    Family History  Problem Relation Age of Onset  . Brain cancer Mother 83       Deceased-Tumor  . Glaucoma Maternal Grandfather   . Glaucoma Maternal Aunt   . Cancer Maternal Grandmother   . Lung cancer Maternal Aunt   . Healthy Sister        x1    Social History   Socioeconomic History  . Marital status: Single    Spouse name: None  . Number of children: None  . Years of education: None  . Highest education level: None  Social Needs  . Financial resource strain: None  . Food insecurity - worry: None  . Food insecurity - inability: None  . Transportation needs - medical: None  . Transportation needs - non-medical: None  Occupational History  . None  Tobacco Use  . Smoking status: Never Smoker  . Smokeless tobacco: Never Used  Substance and  Sexual Activity  . Alcohol use: Yes    Alcohol/week: 0.0 oz    Comment: rare  . Drug use: No  . Sexual activity: Yes    Partners: Male    Birth control/protection: Pill    Comment: boyfriend  Other Topics Concern  . None  Social History Narrative  . None    Review of Systems - See HPI.  All other ROS are negative.  BP 122/86   Pulse 67   Temp 98.1 F (36.7 C) (Oral)   Resp 14   Ht 5\' 2"  (1.575 m)   Wt 208 lb (94.3 kg)   SpO2 98%   BMI 38.04 kg/m   Physical Exam  Constitutional: She is well-developed, well-nourished, and in no distress.  HENT:  Head: Normocephalic and atraumatic.  Eyes: Conjunctivae are normal.  Cardiovascular: Normal rate, regular rhythm, normal heart sounds and intact distal pulses.  Pulmonary/Chest: Effort normal and breath sounds normal. No respiratory distress. She has no wheezes. She has no rales. She exhibits no tenderness.  Neurological: She is alert.  Skin: Skin is warm and dry. No rash noted.  Psychiatric: Affect normal.  Vitals reviewed.   Recent Results (from the past 2160 hour(s))  CBC with Differential/Platelet     Status: None   Collection Time: 04/02/17  9:07 AM  Result Value  Ref Range   WBC 5.2 4.0 - 10.5 K/uL   RBC 4.99 3.87 - 5.11 Mil/uL   Hemoglobin 14.8 12.0 - 15.0 g/dL   HCT 45.0 36.0 - 46.0 %   MCV 90.3 78.0 - 100.0 fl   MCHC 32.8 30.0 - 36.0 g/dL   RDW 13.2 11.5 - 15.5 %   Platelets 234.0 150.0 - 400.0 K/uL   Neutrophils Relative % 65.4 43.0 - 77.0 %   Lymphocytes Relative 22.0 12.0 - 46.0 %   Monocytes Relative 9.0 3.0 - 12.0 %   Eosinophils Relative 2.8 0.0 - 5.0 %   Basophils Relative 0.8 0.0 - 3.0 %   Neutro Abs 3.4 1.4 - 7.7 K/uL   Lymphs Abs 1.1 0.7 - 4.0 K/uL   Monocytes Absolute 0.5 0.1 - 1.0 K/uL   Eosinophils Absolute 0.1 0.0 - 0.7 K/uL   Basophils Absolute 0.0 0.0 - 0.1 K/uL  Comprehensive metabolic panel     Status: Abnormal   Collection Time: 04/02/17  9:07 AM  Result Value Ref Range   Sodium 141  135 - 145 mEq/L   Potassium 4.4 3.5 - 5.1 mEq/L   Chloride 107 96 - 112 mEq/L   CO2 26 19 - 32 mEq/L   Glucose, Bld 102 (H) 70 - 99 mg/dL   BUN 17 6 - 23 mg/dL   Creatinine, Ser 0.70 0.40 - 1.20 mg/dL   Total Bilirubin 0.6 0.2 - 1.2 mg/dL   Alkaline Phosphatase 65 39 - 117 U/L   AST 19 0 - 37 U/L   ALT 20 0 - 35 U/L   Total Protein 5.9 (L) 6.0 - 8.3 g/dL   Albumin 3.7 3.5 - 5.2 g/dL   Calcium 8.8 8.4 - 10.5 mg/dL   GFR 95.26 >60.00 mL/min  Lipid panel     Status: None   Collection Time: 04/02/17  9:07 AM  Result Value Ref Range   Cholesterol 153 0 - 200 mg/dL    Comment: ATP III Classification       Desirable:  < 200 mg/dL               Borderline High:  200 - 239 mg/dL          High:  > = 240 mg/dL   Triglycerides 84.0 0.0 - 149.0 mg/dL    Comment: Normal:  <150 mg/dLBorderline High:  150 - 199 mg/dL   HDL 54.30 >39.00 mg/dL   VLDL 16.8 0.0 - 40.0 mg/dL   LDL Cholesterol 81 0 - 99 mg/dL   Total CHOL/HDL Ratio 3     Comment:                Men          Women1/2 Average Risk     3.4          3.3Average Risk          5.0          4.42X Average Risk          9.6          7.13X Average Risk          15.0          11.0                       NonHDL 98.20     Comment: NOTE:  Non-HDL goal should be 30 mg/dL higher than patient's LDL goal (i.e. LDL goal of <  70 mg/dL, would have non-HDL goal of < 100 mg/dL)  TSH     Status: None   Collection Time: 04/02/17  9:07 AM  Result Value Ref Range   TSH 1.40 0.35 - 4.50 uIU/mL  Vitamin D (25 hydroxy)     Status: Abnormal   Collection Time: 04/02/17  9:07 AM  Result Value Ref Range   VITD 20.18 (L) 30.00 - 100.00 ng/mL  Hemoglobin A1c     Status: None   Collection Time: 04/02/17  4:16 PM  Result Value Ref Range   Hgb A1c MFr Bld 5.6 4.6 - 6.5 %    Comment: Glycemic Control Guidelines for People with Diabetes:Non Diabetic:  <6%Goal of Therapy: <7%Additional Action Suggested:  >8%     Assessment/Plan: Obesity Will start Qsymia at 3.75-23 mg  daily for 2 weeks. Then increase to 7.5-46 mg daily. Follow-up scheduled.    Leeanne Rio, PA-C

## 2017-06-16 NOTE — Progress Notes (Signed)
Pre visit review using our clinic review tool, if applicable. No additional management support is needed unless otherwise documented below in the visit note. 

## 2017-06-16 NOTE — Patient Instructions (Signed)
Keep up with diet and exercise regimen.  Start the Qysmia and let me know how you tolerate it. If doing well, I will send the next dose of medication to mail-order pharmacy.   Follow-up with me in 1 month so we can reassess.  For the foot, if you want I can set you up with one of our Sports Medicine physicians for a cortisone injection. Make sure to wear supportive footwear. Follow the exercises below.   Plantar Fasciitis Rehab Ask your health care provider which exercises are safe for you. Do exercises exactly as told by your health care provider and adjust them as directed. It is normal to feel mild stretching, pulling, tightness, or discomfort as you do these exercises, but you should stop right away if you feel sudden pain or your pain gets worse. Do not begin these exercises until told by your health care provider. Stretching and range of motion exercises These exercises warm up your muscles and joints and improve the movement and flexibility of your foot. These exercises also help to relieve pain. Exercise A: Plantar fascia stretch  1. Sit with your left / right leg crossed over your opposite knee. 2. Hold your heel with one hand with that thumb near your arch. With your other hand, hold your toes and gently pull them back toward the top of your foot. You should feel a stretch on the bottom of your toes or your foot or both. 3. Hold this stretch for__________ seconds. 4. Slowly release your toes and return to the starting position. Repeat __________ times. Complete this exercise __________ times a day. Exercise B: Gastroc, standing  1. Stand with your hands against a wall. 2. Extend your left / right leg behind you, and bend your front knee slightly. 3. Keeping your heels on the floor and keeping your back knee straight, shift your weight toward the wall without arching your back. You should feel a gentle stretch in your left / right calf. 4. Hold this position for __________  seconds. Repeat __________ times. Complete this exercise __________ times a day. Exercise C: Soleus, standing 1. Stand with your hands against a wall. 2. Extend your left / right leg behind you, and bend your front knee slightly. 3. Keeping your heels on the floor, bend your back knee and slightly shift your weight over the back leg. You should feel a gentle stretch deep in your calf. 4. Hold this position for __________ seconds. Repeat __________ times. Complete this exercise __________ times a day. Exercise D: Gastrocsoleus, standing 1. Stand with the ball of your left / right foot on a step. The ball of your foot is on the walking surface, right under your toes. 2. Keep your other foot firmly on the same step. 3. Hold onto the wall or a railing for balance. 4. Slowly lift your other foot, allowing your body weight to press your heel down over the edge of the step. You should feel a stretch in your left / right calf. 5. Hold this position for __________ seconds. 6. Return both feet to the step. 7. Repeat this exercise with a slight bend in your left / right knee. Repeat __________ times with your left / right knee straight and __________ times with your left / right knee bent. Complete this exercise __________ times a day. Balance exercise This exercise builds your balance and strength control of your arch to help take pressure off your plantar fascia. Exercise E: Single leg stand 1. Without shoes, stand  near a railing or in a doorway. You may hold onto the railing or door frame as needed. 2. Stand on your left / right foot. Keep your big toe down on the floor and try to keep your arch lifted. Do not let your foot roll inward. 3. Hold this position for __________ seconds. 4. If this exercise is too easy, you can try it with your eyes closed or while standing on a pillow. Repeat __________ times. Complete this exercise __________ times a day. This information is not intended to replace  advice given to you by your health care provider. Make sure you discuss any questions you have with your health care provider. Document Released: 07/08/2005 Document Revised: 03/12/2016 Document Reviewed: 05/22/2015 Elsevier Interactive Patient Education  2018 [employer].

## 2017-06-19 DIAGNOSIS — Z1231 Encounter for screening mammogram for malignant neoplasm of breast: Secondary | ICD-10-CM | POA: Diagnosis not present

## 2017-06-19 LAB — HM MAMMOGRAPHY

## 2017-06-23 ENCOUNTER — Other Ambulatory Visit: Payer: Self-pay | Admitting: Physician Assistant

## 2017-06-23 ENCOUNTER — Encounter: Payer: Self-pay | Admitting: Family Medicine

## 2017-06-23 ENCOUNTER — Ambulatory Visit (INDEPENDENT_AMBULATORY_CARE_PROVIDER_SITE_OTHER): Payer: BLUE CROSS/BLUE SHIELD | Admitting: Family Medicine

## 2017-06-23 DIAGNOSIS — M722 Plantar fascial fibromatosis: Secondary | ICD-10-CM | POA: Diagnosis not present

## 2017-06-23 DIAGNOSIS — E559 Vitamin D deficiency, unspecified: Secondary | ICD-10-CM

## 2017-06-23 MED ORDER — METHYLPREDNISOLONE ACETATE 40 MG/ML IJ SUSP
40.0000 mg | Freq: Once | INTRAMUSCULAR | Status: AC
  Administered 2017-06-23: 40 mg via INTRA_ARTICULAR

## 2017-06-23 NOTE — Patient Instructions (Signed)
You have plantar fasciitis Take tylenol and/or aleve as needed for pain  Plantar fascia stretch for 20-30 seconds (do 3 of these) in morning Lowering/raise on a step exercises 3 x 10 once or twice a day - this is very important for long term recovery. Can add heel walks, toe walks forward and backward as well Ice heel for 15 minutes as needed. Avoid flat shoes/barefoot walking as much as possible. Arch straps have been shown to help with pain. Inserts are important (dr. Zoe Lan active series, spencos, our green insoles, custom orthotics). Let us know if you want [place] to make you custom orthotics - check out the [employer] website as it will give you a list of podiatrists who do that method you've had before. Steroid injection is a consideration for short term pain relief if you are struggling. Physical therapy is also an option. Follow up with me in 6 weeks.

## 2017-06-24 ENCOUNTER — Encounter: Payer: Self-pay | Admitting: Family Medicine

## 2017-06-24 ENCOUNTER — Telehealth: Payer: Self-pay

## 2017-06-24 ENCOUNTER — Other Ambulatory Visit: Payer: Self-pay | Admitting: Physician Assistant

## 2017-06-24 DIAGNOSIS — M722 Plantar fascial fibromatosis: Secondary | ICD-10-CM | POA: Insufficient documentation

## 2017-06-24 MED ORDER — PHENTERMINE-TOPIRAMATE ER 7.5-46 MG PO CP24: ORAL_CAPSULE | ORAL | 0 refills | Status: DC

## 2017-06-24 NOTE — Telephone Encounter (Signed)
Thank you for the update. I am glad she has been doing well. I have sent in the Rx for her to mail-order pharmacy.

## 2017-06-24 NOTE — Assessment & Plan Note (Signed)
reviewed home exercises and stretches.  Continue with orthotics.  Arch binders.  Injection given today.  Tylenol if needed (allergic to naproxen).  Icing if needed.  F/u in 6 weeks.  Consider physical therapy.  After informed written consent timeout was performed.  Patient was seated on exam table.  Area overlying proximal plantar fascia medially prepped with alcohol swab then using ultrasound guidance patient left plantar fascia was injected with 2:1 bupivicaine: depomedrol.  Patient tolerated procedure well without immediate complications.

## 2017-06-24 NOTE — Progress Notes (Signed)
PCP: Brunetta Jeans, PA-C  Subjective:   HPI: Patient is a 46 y.o. female here for left heel pain.  Patient reports for a few weeks now she's had off and on pain plantar left heel medially. Pain is a twinge and comes and goes. Currently pain is 1/10 level. Has history of plantar fasciitis and has had injection in past - helps for about a year. Also has custom orthotics but feels current ones are breaking down. Worse first thing in the morning. No swelling or skin changes, numbness. Not taking any medicines for this.  Past Medical History:  Diagnosis Date  . History of chicken pox   . Venous thrombosis of leg    Left    Current Outpatient Medications on File Prior to Visit  Medication Sig Dispense Refill  . aspirin 81 MG tablet Take 81 mg by mouth daily.    Marland Kitchen CAMILA 0.35 MG tablet Take 1 tablet by mouth daily.  3  . EPINEPHrine 0.3 mg/0.3 mL IJ SOAJ injection AS DIRECTED PRN SYSTEMIC REACTION INJECTION 30 DAYS  1  . Phentermine-Topiramate (QSYMIA) 3.75-23 MG CP24 Take 1 capsule by mouth daily. 14 capsule 0  . Vitamin D, Ergocalciferol, (DRISDOL) 50000 units CAPS capsule Take 1 capsule (50,000 Units total) by mouth every 7 (seven) days. 12 capsule 0   No current facility-administered medications on file prior to visit.     Past Surgical History:  Procedure Laterality Date  . BREAST SURGERY     Fibroidadenoma-Left  . CHOLECYSTECTOMY    . REFRACTIVE SURGERY     Bilateral  . ruptured disc    . TONSILLECTOMY    . WISDOM TOOTH EXTRACTION      Allergies  Allergen Reactions  . Amoxil [Amoxicillin] Hives  . Naproxen Rash    Social History   Socioeconomic History  . Marital status: Single    Spouse name: Not on file  . Number of children: Not on file  . Years of education: Not on file  . Highest education level: Not on file  Social Needs  . Financial resource strain: Not on file  . Food insecurity - worry: Not on file  . Food insecurity - inability: Not on file   . Transportation needs - medical: Not on file  . Transportation needs - non-medical: Not on file  Occupational History  . Not on file  Tobacco Use  . Smoking status: Never Smoker  . Smokeless tobacco: Never Used  Substance and Sexual Activity  . Alcohol use: Yes    Alcohol/week: 0.0 oz    Comment: rare  . Drug use: No  . Sexual activity: Yes    Partners: Male    Birth control/protection: Pill    Comment: boyfriend  Other Topics Concern  . Not on file  Social History Narrative  . Not on file    Family History  Problem Relation Age of Onset  . Brain cancer Mother 84       Deceased-Tumor  . Glaucoma Maternal Grandfather   . Glaucoma Maternal Aunt   . Cancer Maternal Grandmother   . Lung cancer Maternal Aunt   . Healthy Sister        x1    BP (!) 88/59   Pulse (!) 111   Ht 5\' 2"  (1.575 m)   Wt 205 lb (93 kg)   BMI 37.49 kg/m   Review of Systems: See HPI above.     Objective:  Physical Exam:  Gen: NAD, comfortable in exam  room  Left foot/ankle: No gross deformity, swelling, ecchymoses FROM with 5/5 strength. TTP mildly medial calcaneus at insertion of plantar fascia Negative ant drawer and talar tilt.   Negative syndesmotic compression. Negative calcaneal squeeze. Thompsons test negative. NV intact distally.  Right foot/ankle: No deformity. FROM with 5/5 strength. No tenderness. NVI distally.   Assessment & Plan:  1. Left plantar fasciitis - reviewed home exercises and stretches.  Continue with orthotics.  Arch binders.  Injection given today.  Tylenol if needed (allergic to naproxen).  Icing if needed.  F/u in 6 weeks.  Consider physical therapy.  After informed written consent timeout was performed.  Patient was seated on exam table.  Area overlying proximal plantar fascia medially prepped with alcohol swab then using ultrasound guidance patient left plantar fascia was injected with 2:1 bupivicaine: depomedrol.  Patient tolerated procedure well  without immediate complications.

## 2017-06-24 NOTE — Telephone Encounter (Signed)
Copied from [place]. Topic: Quick Communication - Office Called Patient >> Jun 24, 2017  1:17 PM Gerilyn Nestle, RN wrote: Reason for CRM: LM requesting call back to discuss blood pressure. When pt returns call, please warm transfer to Roderic Ovens, RN @ [phone].     Spoke with patient regarding blood pressure. Patient states the blood pressure taken yesterday was taken inaccurately. She reports "the automatic machine pumped up quickly then released really fast". She also states the BP cuff was coming off during the reading. Patient reports monitoring her BP every Sunday while at [facility], results have always been normal. Patient denies lightheadedness, dizziness, blurred vision or headache. Advised patient to continue to monitor BP and call with any low results. Patient verbalized understanding.   Patient requesting prescription for Qysmia #90 to be sent to mail in pharmacy on file ([facility]). She was instructed to call after taking medication to report her tolerance. Patient denies any side effects.

## 2017-06-25 ENCOUNTER — Other Ambulatory Visit: Payer: Self-pay | Admitting: Physician Assistant

## 2017-06-25 ENCOUNTER — Encounter: Payer: Self-pay | Admitting: Physician Assistant

## 2017-06-25 MED ORDER — PHENTERMINE HCL 37.5 MG PO CAPS: 37.5000 mg | ORAL_CAPSULE | ORAL | 0 refills | Status: DC

## 2017-06-26 ENCOUNTER — Other Ambulatory Visit: Payer: Self-pay | Admitting: Physician Assistant

## 2017-06-26 MED ORDER — PHENTERMINE HCL 37.5 MG PO CAPS: 37.5000 mg | ORAL_CAPSULE | ORAL | 0 refills | Status: DC

## 2017-07-02 ENCOUNTER — Encounter: Payer: Self-pay | Admitting: Emergency Medicine

## 2017-07-02 ENCOUNTER — Encounter: Payer: Self-pay | Admitting: Physician Assistant

## 2017-11-10 ENCOUNTER — Encounter: Payer: Self-pay | Admitting: Physician Assistant

## 2018-01-30 ENCOUNTER — Other Ambulatory Visit: Payer: Self-pay

## 2018-01-30 ENCOUNTER — Encounter: Payer: Self-pay | Admitting: Physician Assistant

## 2018-01-30 ENCOUNTER — Ambulatory Visit (INDEPENDENT_AMBULATORY_CARE_PROVIDER_SITE_OTHER): Payer: BLUE CROSS/BLUE SHIELD | Admitting: Physician Assistant

## 2018-01-30 VITALS — BP 118/78 | HR 82 | Temp 98.1°F | Resp 16 | Ht 62.0 in | Wt 215.0 lb

## 2018-01-30 DIAGNOSIS — M791 Myalgia, unspecified site: Secondary | ICD-10-CM | POA: Diagnosis not present

## 2018-01-30 DIAGNOSIS — M2550 Pain in unspecified joint: Secondary | ICD-10-CM

## 2018-01-30 DIAGNOSIS — Z6836 Body mass index (BMI) 36.0-36.9, adult: Secondary | ICD-10-CM

## 2018-01-30 DIAGNOSIS — E669 Obesity, unspecified: Secondary | ICD-10-CM | POA: Diagnosis not present

## 2018-01-30 LAB — CBC WITH DIFFERENTIAL/PLATELET
Basophils Absolute: 0.1 10*3/uL (ref 0.0–0.1)
Basophils Relative: 2.6 % (ref 0.0–3.0)
Eosinophils Absolute: 0.3 10*3/uL (ref 0.0–0.7)
Eosinophils Relative: 4.6 % (ref 0.0–5.0)
HCT: 44.3 % (ref 36.0–46.0)
Hemoglobin: 14.7 g/dL (ref 12.0–15.0)
Lymphocytes Relative: 26.1 % (ref 12.0–46.0)
Lymphs Abs: 1.5 10*3/uL (ref 0.7–4.0)
MCHC: 33.1 g/dL (ref 30.0–36.0)
MCV: 88.9 fl (ref 78.0–100.0)
Monocytes Absolute: 0.4 10*3/uL (ref 0.1–1.0)
Monocytes Relative: 7.6 % (ref 3.0–12.0)
Neutro Abs: 3.4 10*3/uL (ref 1.4–7.7)
Neutrophils Relative %: 59.1 % (ref 43.0–77.0)
Platelets: 249 10*3/uL (ref 150.0–400.0)
RBC: 4.98 Mil/uL (ref 3.87–5.11)
RDW: 13.8 % (ref 11.5–15.5)
WBC: 5.7 10*3/uL (ref 4.0–10.5)

## 2018-01-30 LAB — COMPREHENSIVE METABOLIC PANEL
ALT: 39 U/L — ABNORMAL HIGH (ref 0–35)
AST: 28 U/L (ref 0–37)
Albumin: 3.8 g/dL (ref 3.5–5.2)
Alkaline Phosphatase: 74 U/L (ref 39–117)
BUN: 17 mg/dL (ref 6–23)
CO2: 29 mEq/L (ref 19–32)
Calcium: 9 mg/dL (ref 8.4–10.5)
Chloride: 109 mEq/L (ref 96–112)
Creatinine, Ser: 0.79 mg/dL (ref 0.40–1.20)
GFR: 82.55 mL/min (ref >60.00)
Glucose, Bld: 116 mg/dL — ABNORMAL HIGH (ref 70–99)
Potassium: 4.6 mEq/L (ref 3.5–5.1)
Sodium: 145 mEq/L (ref 135–145)
Total Bilirubin: 0.4 mg/dL (ref 0.2–1.2)
Total Protein: 6.4 g/dL (ref 6.0–8.3)

## 2018-01-30 LAB — CK: Total CK: 55 U/L (ref 7–177)

## 2018-01-30 LAB — T4, FREE: Free T4: 0.84 ng/dL (ref 0.60–1.60)

## 2018-01-30 LAB — VITAMIN B12: Vitamin B-12: 322 pg/mL (ref 211–911)

## 2018-01-30 LAB — TSH: TSH: 1.48 u[IU]/mL (ref 0.35–4.50)

## 2018-01-30 LAB — SEDIMENTATION RATE: Sed Rate: 25 mm/hr — ABNORMAL HIGH (ref 0–20)

## 2018-01-30 MED ORDER — PHENTERMINE-TOPIRAMATE ER 3.75-23 MG PO CP24: ORAL_CAPSULE | ORAL | 0 refills | Status: DC

## 2018-01-30 MED ORDER — HYDROCORTISONE 2.5 % EX CREA: 1.0000 "application " | TOPICAL_CREAM | Freq: Two times a day (BID) | CUTANEOUS | 3 refills | Status: DC

## 2018-01-30 NOTE — Patient Instructions (Signed)
Please go to the lab today for blood work.  I will call you with your results. We will alter treatment regimen(s) if indicated by your results.   Please consider seeing a counselor through work or by using the handout given. You deserve to come first and be the number 1 priority.   I have refilled your hydrocortisone. I will contact the pharmacy regarding the qsymia starting pack.

## 2018-01-30 NOTE — Progress Notes (Signed)
Patient presents to clinic today to discuss multiple issues.   Patient with longstanding history of obesity, not responding well to just her significant changes in diet and exercise. Previously attempted to start patient on trial of Qsymia, but insurance would not cover at the time. Patient would like to retry getting this covered. Is wanting to pay out of pocket otherwise to help. Body mass index is 39.32 kg/m.  Patient also endorses 2 weeks of intermittent myalgias and arthralgias, lasting < minute each. Notes these are migrating. Affected areas include:   -- Top of the back  -- Lower left back  -- L shoulder blade  -- TMJ bilaterally  -- Left upper biceps.  -- L Collarbone.  -- Ribcage  -- Lower axillary region  Patient also notes occasional tingling in her arms and hands bilaterally. Denies decreased ROM, stiffness of joints, decreased strength. Does note fatigue and cold intolerance.   Past Medical History:  Diagnosis Date  . History of chicken pox   . Venous thrombosis of leg    Left    Current Outpatient Medications on File Prior to Visit  Medication Sig Dispense Refill  . aspirin 81 MG tablet Take 81 mg by mouth daily.    Marland Kitchen CAMILA 0.35 MG tablet Take 1 tablet by mouth daily.  3  . EPINEPHrine 0.3 mg/0.3 mL IJ SOAJ injection AS DIRECTED PRN SYSTEMIC REACTION INJECTION 30 DAYS  1   No current facility-administered medications on file prior to visit.     Allergies  Allergen Reactions  . Amoxil [Amoxicillin] Hives  . Naproxen Rash    Family History  Problem Relation Age of Onset  . Brain cancer Mother 51       Deceased-Tumor  . Glaucoma Maternal Grandfather   . Glaucoma Maternal Aunt   . Cancer Maternal Grandmother   . Lung cancer Maternal Aunt   . Healthy Sister        x1    Social History   Socioeconomic History  . Marital status: Single    Spouse name: Not on file  . Number of children: Not on file  . Years of education: Not on file  . Highest  education level: Not on file  Occupational History  . Not on file  Social Needs  . Financial resource strain: Not on file  . Food insecurity:    Worry: Not on file    Inability: Not on file  . Transportation needs:    Medical: Not on file    Non-medical: Not on file  Tobacco Use  . Smoking status: Never Smoker  . Smokeless tobacco: Never Used  Substance and Sexual Activity  . Alcohol use: Yes    Alcohol/week: 0.0 oz    Comment: rare  . Drug use: No  . Sexual activity: Yes    Partners: Male    Birth control/protection: Pill    Comment: boyfriend  Lifestyle  . Physical activity:    Days per week: Not on file    Minutes per session: Not on file  . Stress: Not on file  Relationships  . Social connections:    Talks on phone: Not on file    Gets together: Not on file    Attends religious service: Not on file    Active member of club or organization: Not on file    Attends meetings of clubs or organizations: Not on file    Relationship status: Not on file  Other Topics Concern  . Not on  file  Social History Narrative  . Not on file   Review of Systems - See HPI.  All other ROS are negative.  BP 118/78   Pulse 82   Temp 98.1 F (36.7 C) (Oral)   Resp 16   Ht 5\' 2"  (1.575 m)   Wt 215 lb (97.5 kg)   SpO2 98%   BMI 39.32 kg/m   Physical Exam  Constitutional: She is oriented to person, place, and time. She appears well-developed and well-nourished.  HENT:  Head: Normocephalic and atraumatic.  Eyes: Conjunctivae are normal.  Neck: Neck supple. No thyromegaly present.  Cardiovascular: Normal rate, regular rhythm, normal heart sounds and intact distal pulses.  Pulmonary/Chest: Effort normal and breath sounds normal. No stridor. No respiratory distress. She has no wheezes. She has no rales. She exhibits no tenderness.  Musculoskeletal: Normal range of motion. She exhibits no tenderness.  Lymphadenopathy:    She has no cervical adenopathy.  Neurological: She is alert  and oriented to person, place, and time.  Psychiatric: She has a normal mood and affect.  Vitals reviewed.  Assessment/Plan: 1. Arthralgia, unspecified joint 2. Myalgia Exam unremarkable. Will check lab assessment to further assess potential causes. Supportive measures and OTC medications reviewed.  - CBC with Differential/Platelet - Comprehensive metabolic panel - Sedimentation rate - B12 - Vitamin B6 - Vitamin D 1,25 dihydroxy - TSH - T4, free - CK (Creatine Kinase)  3. Class 2 obesity without serious comorbidity with body mass index (BMI) of 36.0 to 36.9 in adult, unspecified obesity type Will start Rx Qsymia sending through requested pharmacy to help with cost. Will start at the lowest dose and titrate from there every 14 days. Follow-up 2 months.    Leeanne Rio, PA-C

## 2018-02-02 ENCOUNTER — Other Ambulatory Visit (INDEPENDENT_AMBULATORY_CARE_PROVIDER_SITE_OTHER): Payer: BLUE CROSS/BLUE SHIELD

## 2018-02-02 DIAGNOSIS — R700 Elevated erythrocyte sedimentation rate: Secondary | ICD-10-CM | POA: Diagnosis not present

## 2018-02-02 LAB — C-REACTIVE PROTEIN: CRP: 0.9 mg/dL (ref 0.5–20.0)

## 2018-02-02 LAB — VITAMIN D 1,25 DIHYDROXY
Vitamin D 1, 25 (OH)2 Total: 38 pg/mL (ref 18–72)
Vitamin D2 1, 25 (OH)2: 13 pg/mL
Vitamin D3 1, 25 (OH)2: 25 pg/mL

## 2018-02-02 LAB — VITAMIN B6: Vitamin B6: 20.9 ng/mL (ref 2.1–21.7)

## 2018-02-04 LAB — ANA: Anti Nuclear Antibody(ANA): NEGATIVE

## 2018-02-04 MED ORDER — PHENTERMINE-TOPIRAMATE ER 3.75-23 MG PO CP24: ORAL_CAPSULE | ORAL | 0 refills | Status: DC

## 2018-02-08 IMAGING — CR DG SCAPULA*L*
2 series · 2 of 2 positions shown · non-contrast
Comparison: None.

CLINICAL DATA: Left scapular soreness for 3 months

EXAM:
LEFT SCAPULA - 2+ VIEWS

[w scapula ap/pa left *]
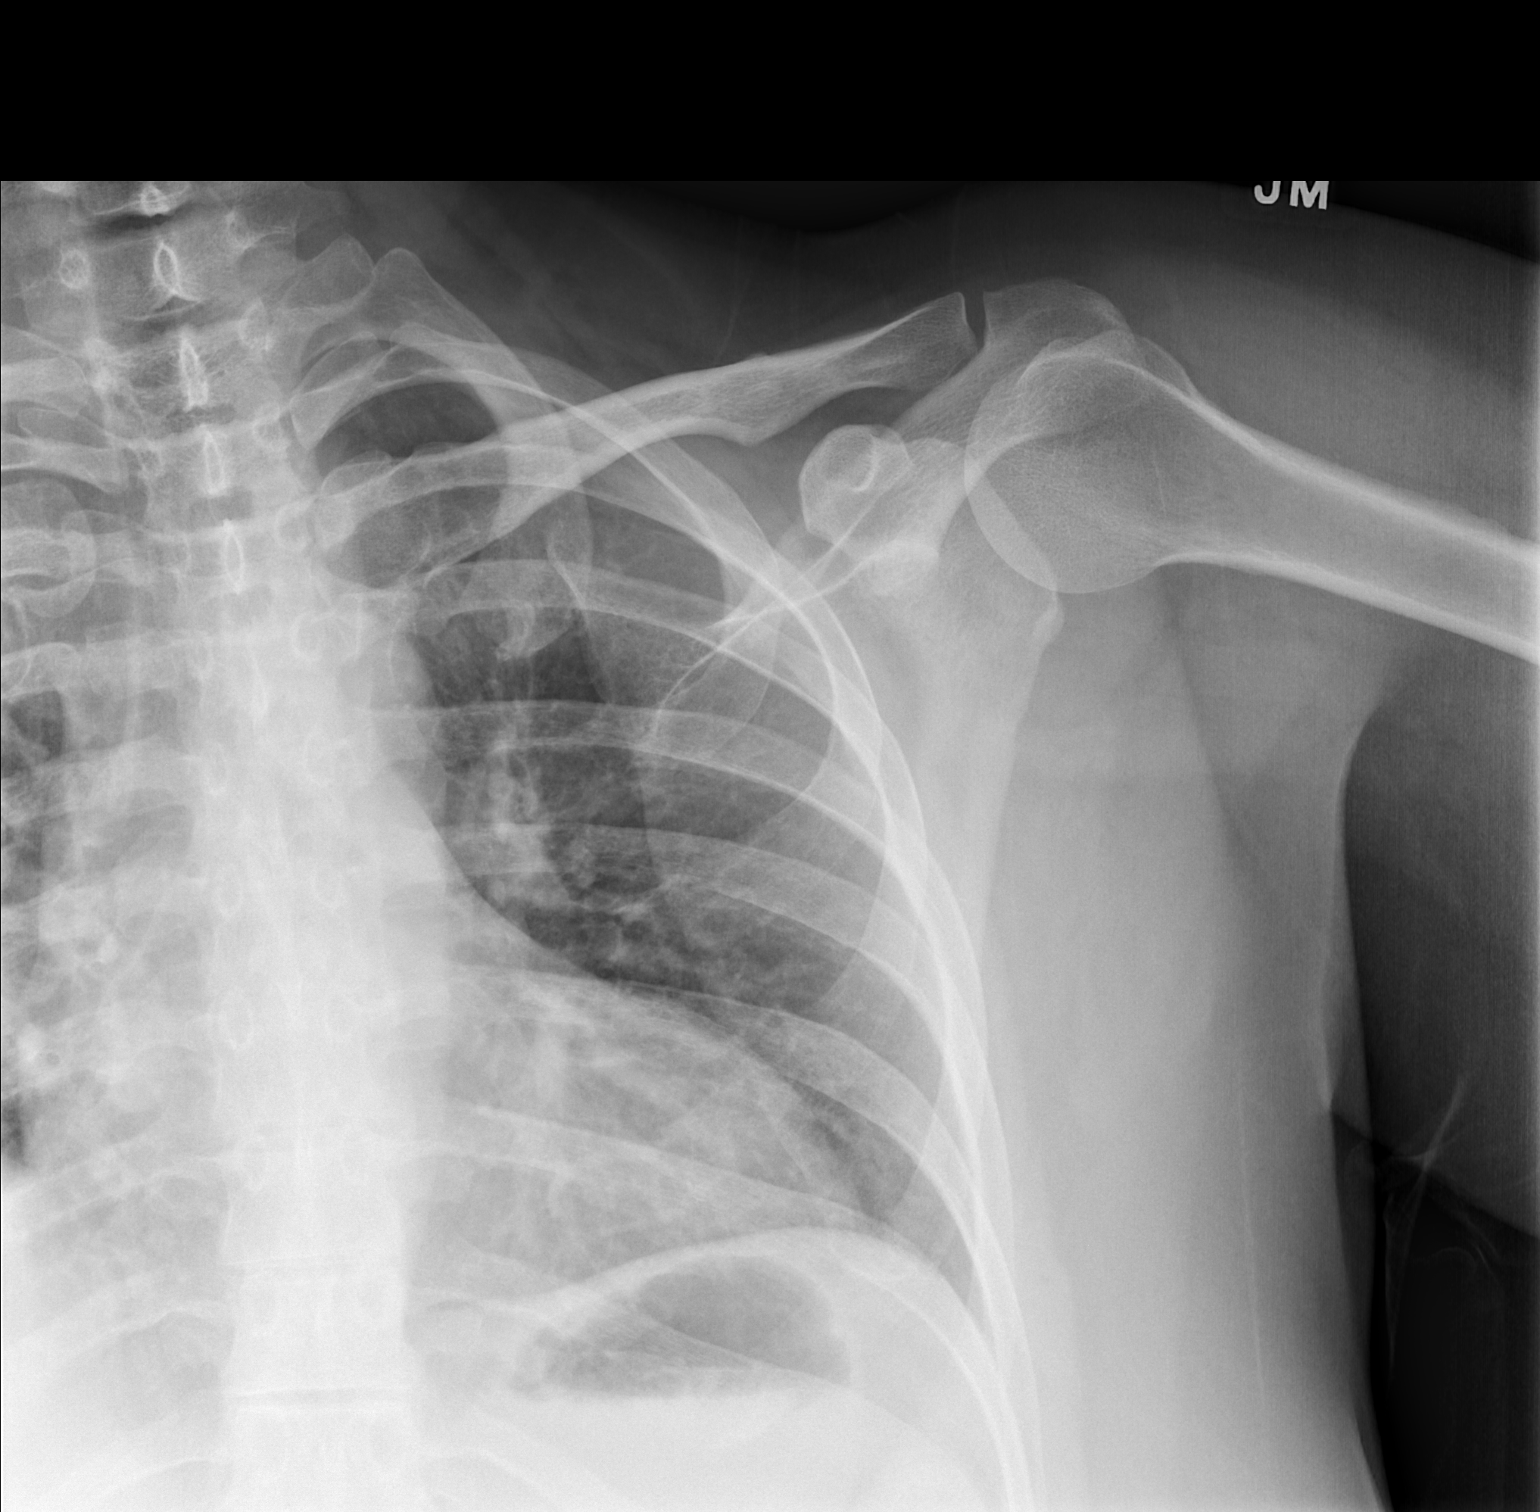

[w scapula lat left *]
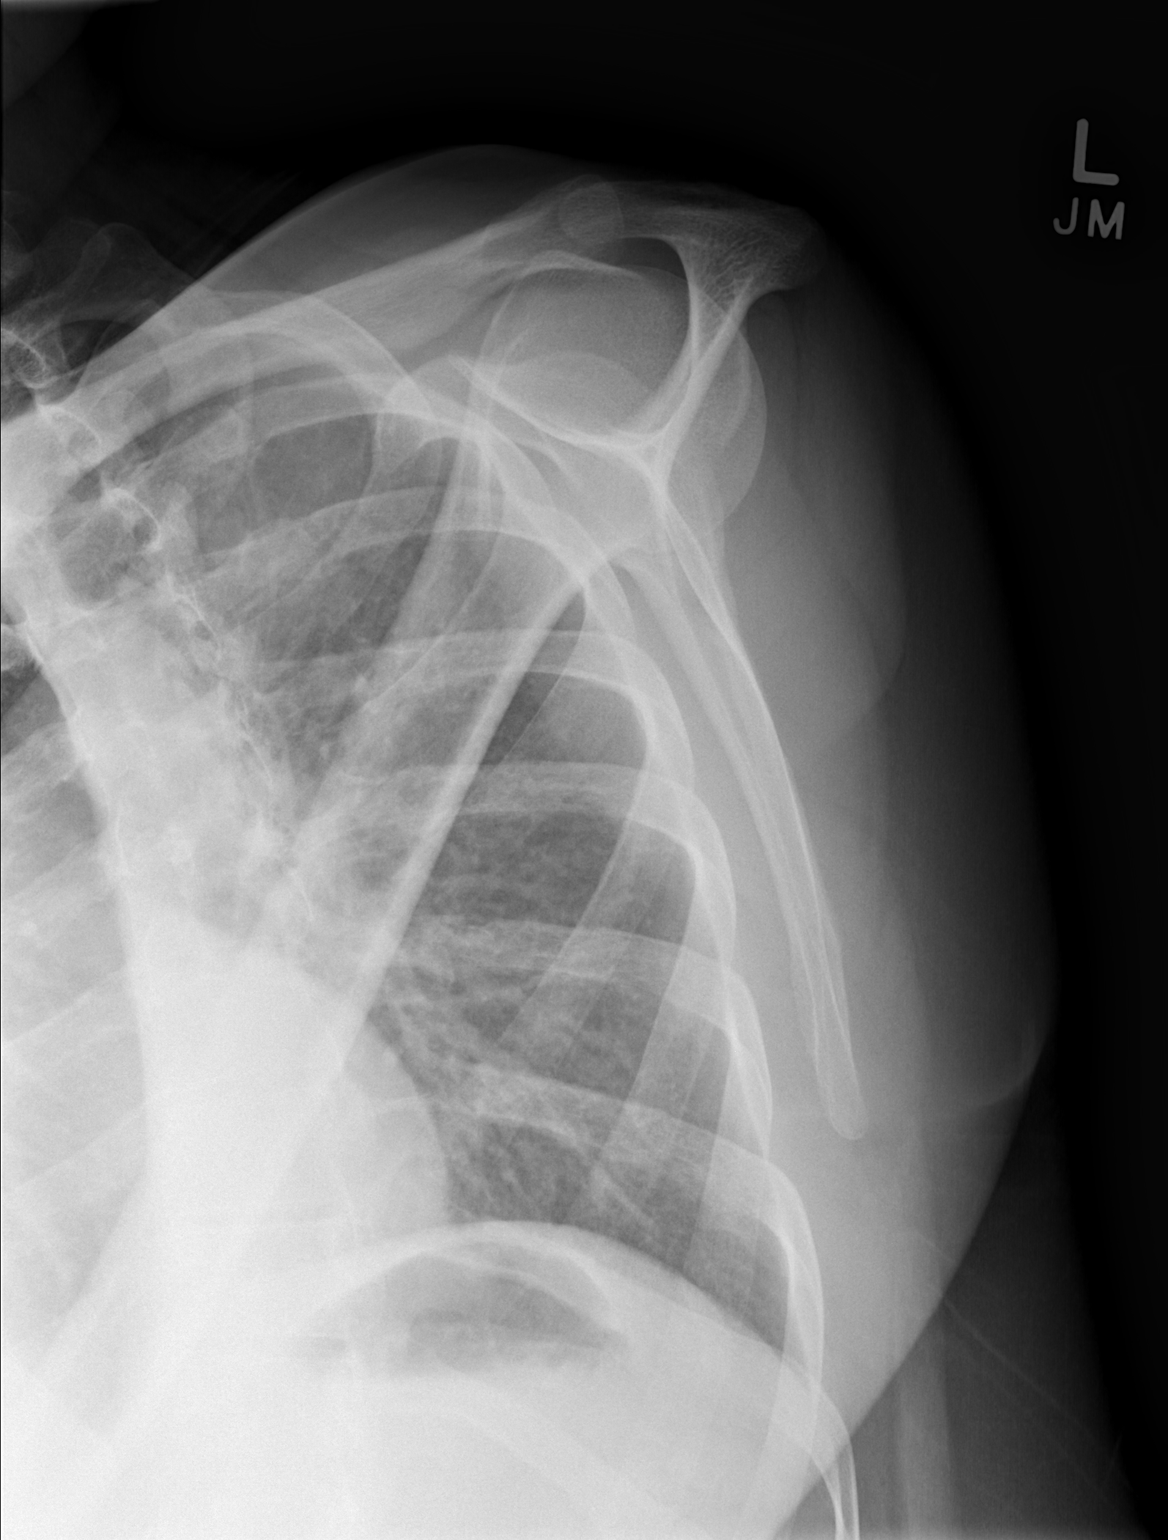

[2 of 2 positions shown; findings below may reference images not displayed]

FINDINGS: There is no evidence of fracture or other focal bone lesions. Soft
tissues are unremarkable.
IMPRESSION: Negative.

## 2018-02-27 ENCOUNTER — Encounter: Payer: Self-pay | Admitting: Physician Assistant

## 2018-03-10 IMAGING — MR MR CERVICAL SPINE W/O CM
4 of 5 series · 30 of 48 positions shown · non-contrast
Comparison: None.

CLINICAL DATA: 45-year-old female with persistent pain radiating to
both shoulders for 5 months. No involvement of the arms. No known
injury. Initial encounter.

EXAM:
MRI CERVICAL SPINE WITHOUT CONTRAST
TECHNIQUE: Multiplanar, multisequence MR imaging of the cervical spine was
performed. No intravenous contrast was administered.

[Series 2: (id) tse sag · sagittal · 3.0mm · 0.41mm/px · 5 of 13 slices shown]
[im 1/13]
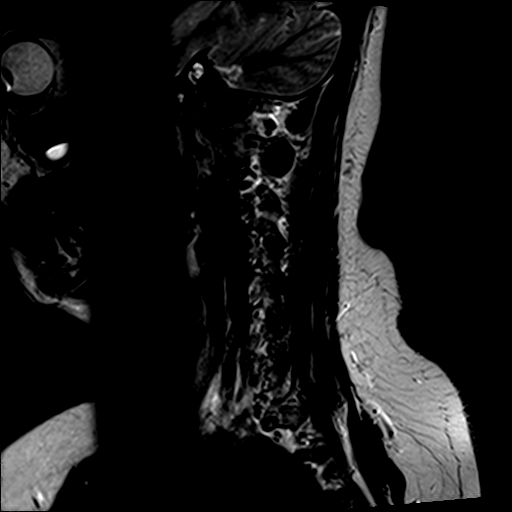
[im 3/13]
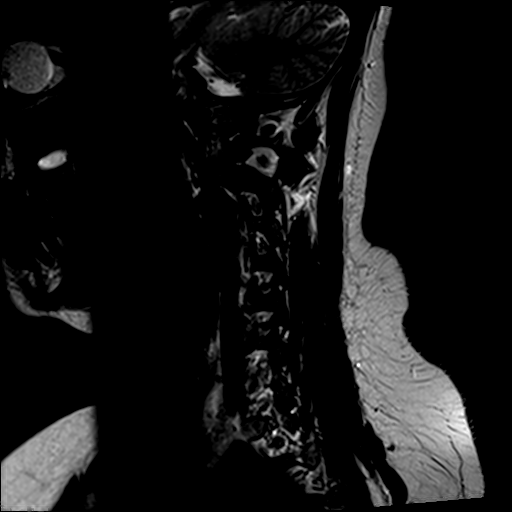
[im 5/13]
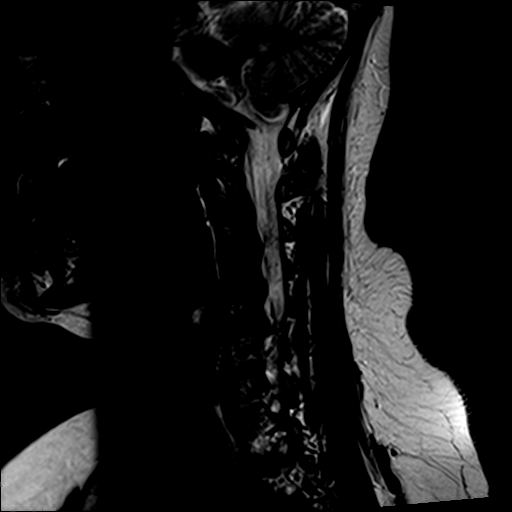
[im 8/13]
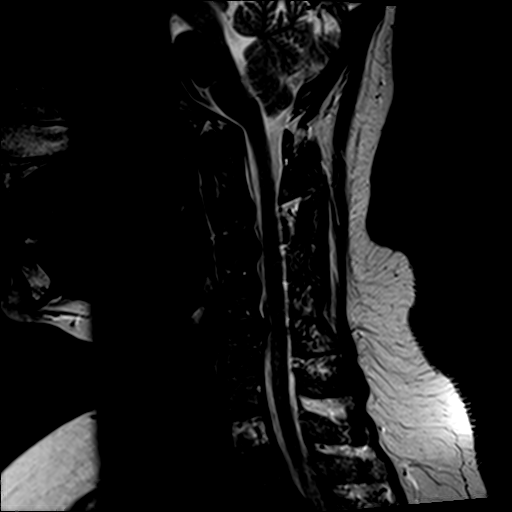
[im 13/13]
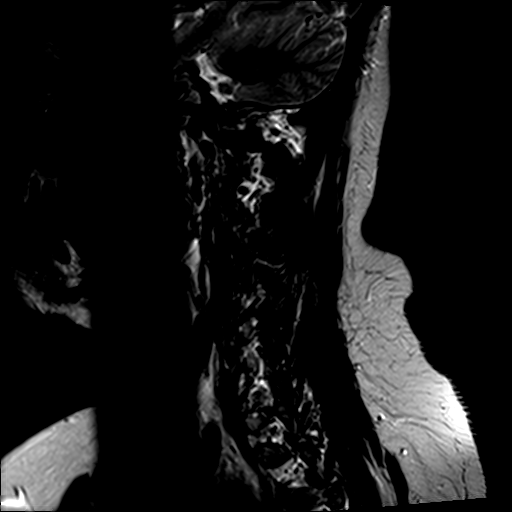

[Series 4: STIR · sagittal · 3.0mm · 0.82mm/px · 5 of 13 slices shown]
[im 1/13]
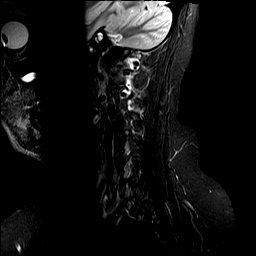
[im 4/13]
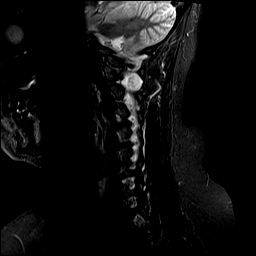
[im 7/13]
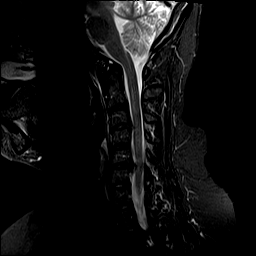
[im 10/13]
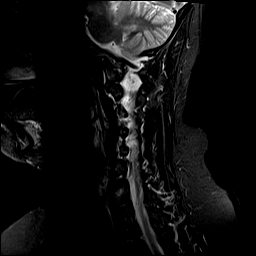
[im 13/13]
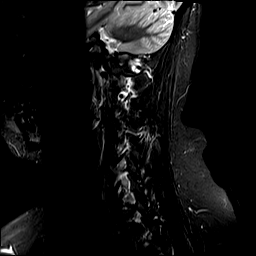

[Series 5: T2 · axial · 3.0mm · 0.39mm/px · z∈[-87,+35]mm · 10 of 38 slices shown (1 of 2)]
[im 3/38]
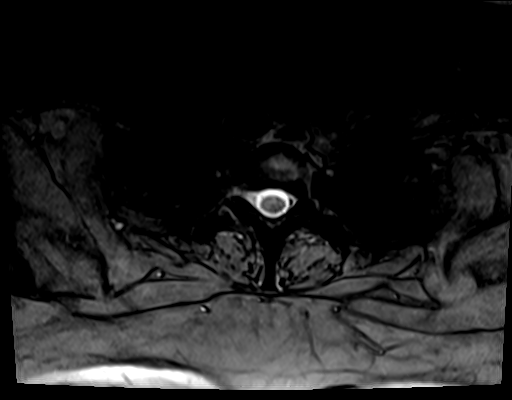
[im 5/38]
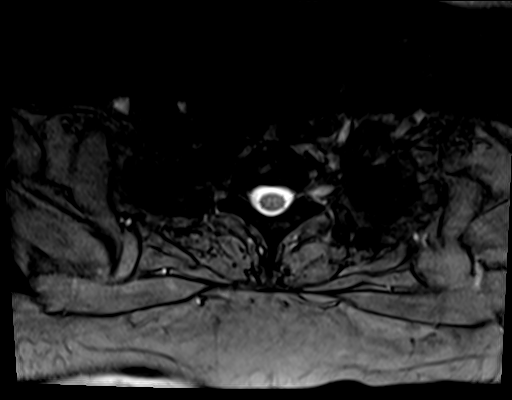
[im 8/38]
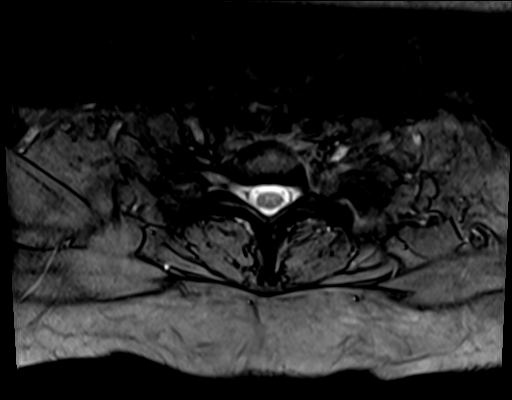
[im 13/38]
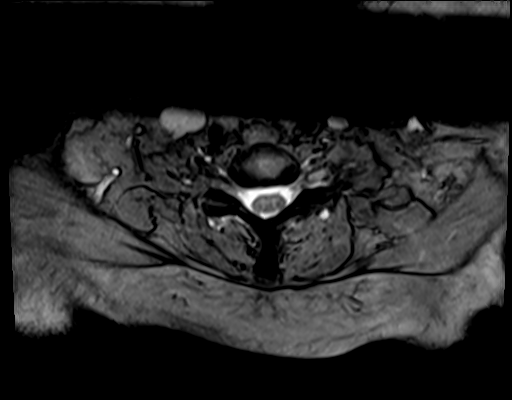
[im 18/38]
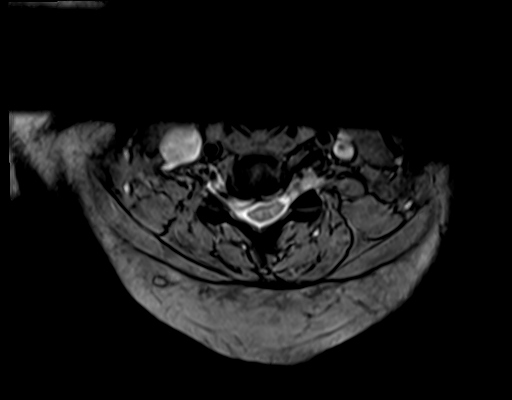
[im 20/38]
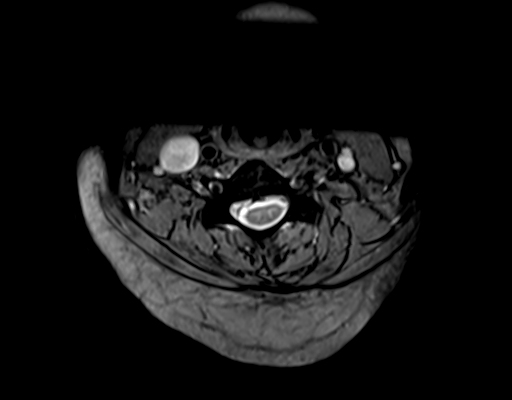
[im 23/38]
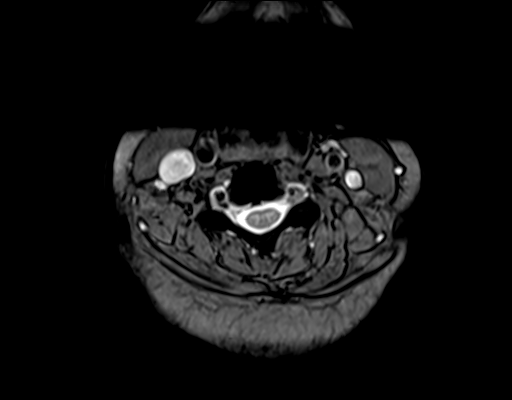
[im 28/38]
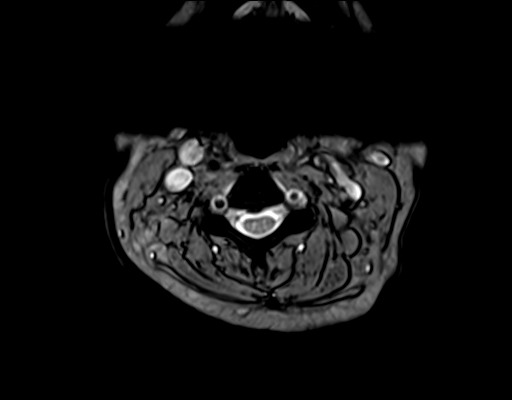
[im 33/38]
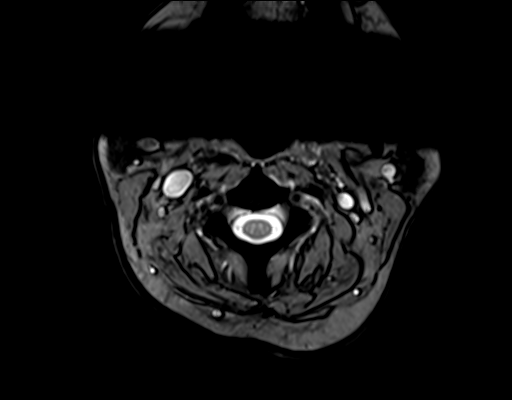
[im 38/38]
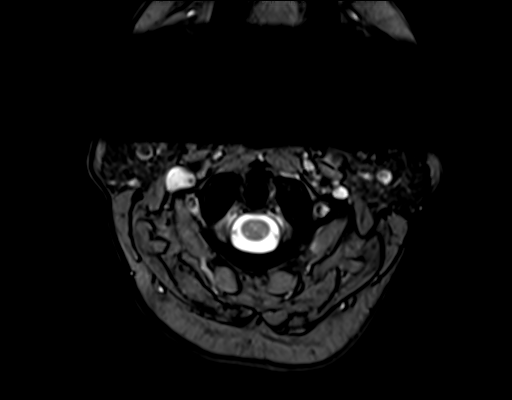

[Series 6: T2 · axial · 3.0mm · 0.62mm/px · z∈[-89,+32]mm · 10 of 38 slices shown (2 of 2)]
[im 3/38]
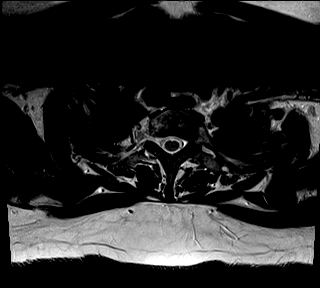
[im 5/38]
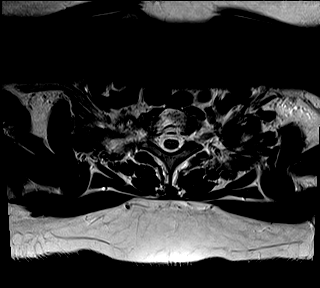
[im 8/38]
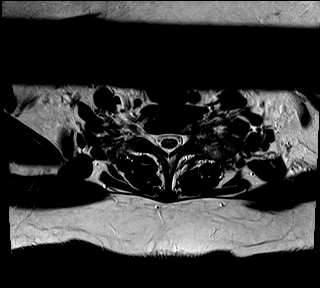
[im 13/38]
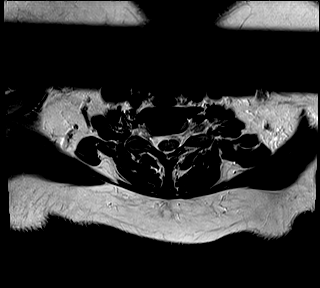
[im 18/38]
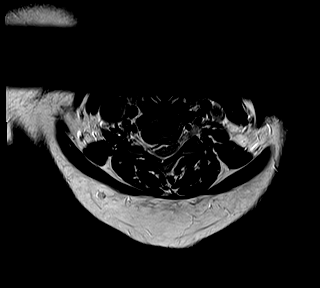
[im 20/38]
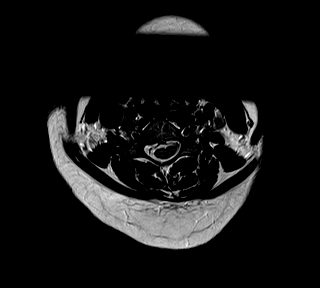
[im 23/38]
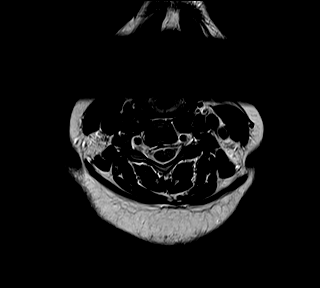
[im 28/38]
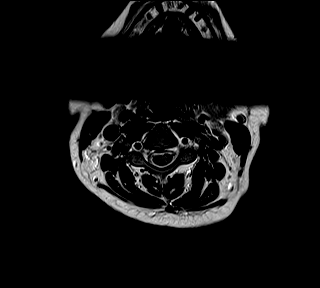
[im 33/38]
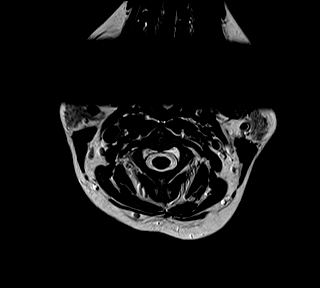
[im 38/38]
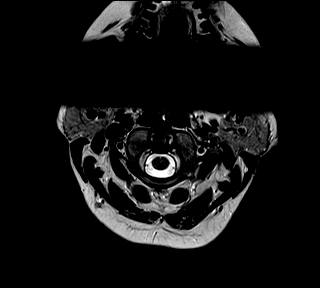

[30 of 48 positions shown; findings below may reference images not displayed]

FINDINGS: Alignment: Dextro convex cervical scoliosis. Straightening and mild
reversal of cervical lordosis.

Vertebrae: Benign vertebral body hemangioma incidentally noted at 1.
No marrow edema or evidence of acute osseous abnormality.

Cord: Despite spinal stenosis described below Spinal cord signal is
within normal limits at all visualized levels.

Posterior Fossa, vertebral arteries, paraspinal tissues:
Cervicomedullary junction is within normal limits. Negative
visualized posterior fossa structures. Negative neck soft tissues.
Preserved major vascular flow voids.

Disc levels:
C2-C3:  Small central disc protrusion.  No significant stenosis.

C3-C4: Larger central disc protrusion with probable annular fissure
(series 5, image 13). Spinal stenosis with mild mass effect on the
right hemi cord near midline. No significant foraminal stenosis.

C4-C5: Mild right uncovertebral hypertrophy and disc protrusion.
Mild right C5 foraminal stenosis.

C5-C6: Lobulated right greater than left posterior disc extrusion
(series 5, image 22). Spinal stenosis with mild to moderate spinal
cord mass effect. No cord signal abnormality. Superimposed
uncovertebral hypertrophy greater on the right. Mild to moderate
right C6 foraminal stenosis.

C6-C7: Mild right uncovertebral hypertrophy. No significant
stenosis.

C7-T1:  Mild facet hypertrophy.  No stenosis.

No upper thoracic spinal stenosis.
IMPRESSION: 1. Mild dextro convex cervical spine scoliosis with no acute osseous
abnormality.
2. Lobulated right greater than left disc herniation at C5-C6
resulting in spinal stenosis with up to moderate spinal cord mass
effect, but no cord signal abnormality. Associated mild to moderate
right C6 foraminal stenosis.
3. Smaller disc herniation at C3-C4 resulting in spinal stenosis
with mild spinal cord mass effect. No significant foraminal
stenosis.

## 2018-03-24 DIAGNOSIS — Z6839 Body mass index (BMI) 39.0-39.9, adult: Secondary | ICD-10-CM | POA: Diagnosis not present

## 2018-03-24 DIAGNOSIS — Z01419 Encounter for gynecological examination (general) (routine) without abnormal findings: Secondary | ICD-10-CM | POA: Diagnosis not present

## 2018-03-24 LAB — RESULTS CONSOLE HPV: CHL HPV: NEGATIVE

## 2018-03-24 LAB — HM PAP SMEAR: HM Pap smear: NEGATIVE

## 2018-03-26 ENCOUNTER — Encounter: Payer: Self-pay | Admitting: Physician Assistant

## 2018-03-27 MED ORDER — PHENTERMINE-TOPIRAMATE ER 7.5-46 MG PO CP24: ORAL_CAPSULE | ORAL | 1 refills | Status: DC

## 2018-04-11 ENCOUNTER — Encounter: Payer: Self-pay | Admitting: Physician Assistant

## 2018-05-27 ENCOUNTER — Other Ambulatory Visit: Payer: Self-pay

## 2018-05-27 ENCOUNTER — Encounter: Payer: Self-pay | Admitting: Physician Assistant

## 2018-05-27 ENCOUNTER — Ambulatory Visit (INDEPENDENT_AMBULATORY_CARE_PROVIDER_SITE_OTHER): Payer: BLUE CROSS/BLUE SHIELD | Admitting: Physician Assistant

## 2018-05-27 VITALS — BP 108/78 | HR 71 | Temp 98.0°F | Resp 14 | Ht 62.0 in | Wt 209.0 lb

## 2018-05-27 DIAGNOSIS — H01133 Eczematous dermatitis of right eye, unspecified eyelid: Secondary | ICD-10-CM

## 2018-05-27 DIAGNOSIS — Z0000 Encounter for general adult medical examination without abnormal findings: Secondary | ICD-10-CM

## 2018-05-27 DIAGNOSIS — F5101 Primary insomnia: Secondary | ICD-10-CM | POA: Diagnosis not present

## 2018-05-27 LAB — COMPREHENSIVE METABOLIC PANEL
ALT: 24 U/L (ref 0–35)
AST: 21 U/L (ref 0–37)
Albumin: 3.8 g/dL (ref 3.5–5.2)
Alkaline Phosphatase: 76 U/L (ref 39–117)
BUN: 13 mg/dL (ref 6–23)
CO2: 24 mEq/L (ref 19–32)
Calcium: 8.9 mg/dL (ref 8.4–10.5)
Chloride: 108 mEq/L (ref 96–112)
Creatinine, Ser: 0.89 mg/dL (ref 0.40–1.20)
GFR: 71.85 mL/min (ref >60.00)
Glucose, Bld: 109 mg/dL — ABNORMAL HIGH (ref 70–99)
Potassium: 4.2 mEq/L (ref 3.5–5.1)
Sodium: 138 mEq/L (ref 135–145)
Total Bilirubin: 0.5 mg/dL (ref 0.2–1.2)
Total Protein: 6.6 g/dL (ref 6.0–8.3)

## 2018-05-27 LAB — CBC WITH DIFFERENTIAL/PLATELET
Basophils Absolute: 0 10*3/uL (ref 0.0–0.1)
Basophils Relative: 0.8 % (ref 0.0–3.0)
Eosinophils Absolute: 0.2 10*3/uL (ref 0.0–0.7)
Eosinophils Relative: 3.2 % (ref 0.0–5.0)
HCT: 47.9 % — ABNORMAL HIGH (ref 36.0–46.0)
Hemoglobin: 15.7 g/dL — ABNORMAL HIGH (ref 12.0–15.0)
Lymphocytes Relative: 23.4 % (ref 12.0–46.0)
Lymphs Abs: 1.4 10*3/uL (ref 0.7–4.0)
MCHC: 32.9 g/dL (ref 30.0–36.0)
MCV: 88.5 fl (ref 78.0–100.0)
Monocytes Absolute: 0.5 10*3/uL (ref 0.1–1.0)
Monocytes Relative: 8.8 % (ref 3.0–12.0)
Neutro Abs: 3.8 10*3/uL (ref 1.4–7.7)
Neutrophils Relative %: 63.8 % (ref 43.0–77.0)
Platelets: 282 10*3/uL (ref 150.0–400.0)
RBC: 5.41 Mil/uL — ABNORMAL HIGH (ref 3.87–5.11)
RDW: 14 % (ref 11.5–15.5)
WBC: 5.9 10*3/uL (ref 4.0–10.5)

## 2018-05-27 LAB — HEMOGLOBIN A1C: Hgb A1c MFr Bld: 5.9 % (ref 4.6–6.5)

## 2018-05-27 LAB — TSH: TSH: 1.55 u[IU]/mL (ref 0.35–4.50)

## 2018-05-27 LAB — LIPID PANEL
Cholesterol: 161 mg/dL (ref 0–200)
HDL: 48.6 mg/dL (ref >39.00)
LDL Cholesterol: 99 mg/dL (ref 0–99)
NonHDL: 112.85
Total CHOL/HDL Ratio: 3
Triglycerides: 67 mg/dL (ref 0.0–149.0)
VLDL: 13.4 mg/dL (ref 0.0–40.0)

## 2018-05-27 LAB — VITAMIN D 25 HYDROXY (VIT D DEFICIENCY, FRACTURES): VITD: 21.14 ng/mL — ABNORMAL LOW (ref 30.00–100.00)

## 2018-05-27 MED ORDER — CRISABOROLE 2 % EX OINT: 1.0000 "application " | TOPICAL_OINTMENT | Freq: Every day | CUTANEOUS | 1 refills | Status: DC

## 2018-05-27 NOTE — Assessment & Plan Note (Signed)
Moisturizer recommended. Rx Eucrisa to use.

## 2018-05-27 NOTE — Assessment & Plan Note (Signed)
Depression screen negative. Health Maintenance reviewed. Mammogram is scheduled for December. PAP up-to-date Preventive schedule discussed and handout given in AVS. Will obtain fasting labs today.

## 2018-05-27 NOTE — Assessment & Plan Note (Signed)
Sleep hygiene practices reviewed. Will attempt trial or Pure ZZZs.

## 2018-05-27 NOTE — Patient Instructions (Signed)
-Please go to the lab for blood work.  -Our office will call you with your results unless you have chosen to receive results via MyChart. -If your blood work is normal we will follow-up each year for physicals and as scheduled for chronic medical problems. -If anything is abnormal we will treat accordingly and get you in for a follow-up.  -Apply OTC hydrocortisone to the dry area of upper eyelid for a couple of days. Then stop this and start applying a very small amount of Eucrisa to the area until symptoms calm down. You can then use on an as-needed basis.  -Try to start some PureZZZs over-the-counter to see if this helps.    Preventive Care 40-64 Years, Female Preventive care refers to lifestyle choices and visits with your health care provider that can promote health and wellness. What does preventive care include?  A yearly physical exam. This is also called an annual well check.  Dental exams once or twice a year.  Routine eye exams. Ask your health care provider how often you should have your eyes checked.  Personal lifestyle choices, including: ? Daily care of your teeth and gums. ? Regular physical activity. ? Eating a healthy diet. ? Avoiding tobacco and drug use. ? Limiting alcohol use. ? Practicing safe sex. ? Taking low-dose aspirin daily starting at age 47. ? Taking vitamin and mineral supplements as recommended by your health care provider. What happens during an annual well check? The services and screenings done by your health care provider during your annual well check will depend on your age, overall health, lifestyle risk factors, and family history of disease. Counseling Your health care provider may ask you questions about your:  Alcohol use.  Tobacco use.  Drug use.  Emotional well-being.  Home and relationship well-being.  Sexual activity.  Eating habits.  Work and work Statistician.  Method of birth control.  Menstrual cycle.  Pregnancy  history.  Screening You may have the following tests or measurements:  Height, weight, and BMI.  Blood pressure.  Lipid and cholesterol levels. These may be checked every 5 years, or more frequently if you are over 97 years old.  Skin check.  Lung cancer screening. You may have this screening every year starting at age 28 if you have a 30-pack-year history of smoking and currently smoke or have quit within the past 15 years.  Fecal occult blood test (FOBT) of the stool. You may have this test every year starting at age 39.  Flexible sigmoidoscopy or colonoscopy. You may have a sigmoidoscopy every 5 years or a colonoscopy every 10 years starting at age 39.  Hepatitis C blood test.  Hepatitis B blood test.  Sexually transmitted disease (STD) testing.  Diabetes screening. This is done by checking your blood sugar (glucose) after you have not eaten for a while (fasting). You may have this done every 1-3 years.  Mammogram. This may be done every 1-2 years. Talk to your health care provider about when you should start having regular mammograms. This may depend on whether you have a family history of breast cancer.  BRCA-related cancer screening. This may be done if you have a family history of breast, ovarian, tubal, or peritoneal cancers.  Pelvic exam and Pap test. This may be done every 3 years starting at age 38. Starting at age 40, this may be done every 5 years if you have a Pap test in combination with an HPV test.  Bone density scan. This is  done to screen for osteoporosis. You may have this scan if you are at high risk for osteoporosis.  Discuss your test results, treatment options, and if necessary, the need for more tests with your health care provider. Vaccines Your health care provider may recommend certain vaccines, such as:  Influenza vaccine. This is recommended every year.  Tetanus, diphtheria, and acellular pertussis (Tdap, Td) vaccine. You may need a Td booster  every 10 years.  Varicella vaccine. You may need this if you have not been vaccinated.  Zoster vaccine. You may need this after age 24.  Measles, mumps, and rubella (MMR) vaccine. You may need at least one dose of MMR if you were born in 1957 or later. You may also need a second dose.  Pneumococcal 13-valent conjugate (PCV13) vaccine. You may need this if you have certain conditions and were not previously vaccinated.  Pneumococcal polysaccharide (PPSV23) vaccine. You may need one or two doses if you smoke cigarettes or if you have certain conditions.  Meningococcal vaccine. You may need this if you have certain conditions.  Hepatitis A vaccine. You may need this if you have certain conditions or if you travel or work in places where you may be exposed to hepatitis A.  Hepatitis B vaccine. You may need this if you have certain conditions or if you travel or work in places where you may be exposed to hepatitis B.  Haemophilus influenzae type b (Hib) vaccine. You may need this if you have certain conditions.  Talk to your health care provider about which screenings and vaccines you need and how often you need them. This information is not intended to replace advice given to you by your health care provider. Make sure you discuss any questions you have with your health care provider. Document Released: 08/04/2015 Document Revised: 03/27/2016 Document Reviewed: 05/09/2015 Elsevier Interactive Patient Education  [employer].

## 2018-05-27 NOTE — Progress Notes (Signed)
Patient presents to clinic today for annual exam.  Patient is fasting for labs.  Acute Concerns: Endorses dry itchy patch of her upper R eyelid over the past few weeks. Denies trauma or injury. Denies red eye or drainage.   Health Maintenance: Immunizations -- up-to-date Mammogram --Due for repeat mammogram at the end of the month. PAP -- up-to-date per patient.   Past Medical History:  Diagnosis Date  . History of chicken pox   . Venous thrombosis of leg    Left    Past Surgical History:  Procedure Laterality Date  . BREAST SURGERY     Fibroidadenoma-Left  . CHOLECYSTECTOMY    . REFRACTIVE SURGERY     Bilateral  . ruptured disc    . TONSILLECTOMY    . WISDOM TOOTH EXTRACTION      Current Outpatient Medications on File Prior to Visit  Medication Sig Dispense Refill  . aspirin 81 MG tablet Take 81 mg by mouth daily.    Marland Kitchen CAMILA 0.35 MG tablet Take 1 tablet by mouth daily.  3  . EPINEPHrine 0.3 mg/0.3 mL IJ SOAJ injection AS DIRECTED PRN SYSTEMIC REACTION INJECTION 30 DAYS  1  . hydrocortisone 2.5 % cream Apply 1 application topically 2 (two) times daily. 30 g 3  . Phentermine-Topiramate 7.5-46 MG CP24 Take 1 capsule PO QD 30 capsule 1   No current facility-administered medications on file prior to visit.     Allergies  Allergen Reactions  . Amoxil [Amoxicillin] Hives  . Naproxen Rash    Family History  Problem Relation Age of Onset  . Brain cancer Mother 5       Deceased-Tumor  . Glaucoma Maternal Grandfather   . Glaucoma Maternal Aunt   . Cancer Maternal Grandmother   . Lung cancer Maternal Aunt   . Healthy Sister        x1    Social History   Socioeconomic History  . Marital status: Single    Spouse name: Not on file  . Number of children: Not on file  . Years of education: Not on file  . Highest education level: Not on file  Occupational History  . Not on file  Social Needs  . Financial resource strain: Not on file  . Food insecurity:   Worry: Not on file    Inability: Not on file  . Transportation needs:    Medical: Not on file    Non-medical: Not on file  Tobacco Use  . Smoking status: Never Smoker  . Smokeless tobacco: Never Used  Substance and Sexual Activity  . Alcohol use: Yes    Alcohol/week: 0.0 standard drinks    Comment: rare  . Drug use: No  . Sexual activity: Yes    Partners: Male    Birth control/protection: Pill    Comment: boyfriend  Lifestyle  . Physical activity:    Days per week: Not on file    Minutes per session: Not on file  . Stress: Not on file  Relationships  . Social connections:    Talks on phone: Not on file    Gets together: Not on file    Attends religious service: Not on file    Active member of club or organization: Not on file    Attends meetings of clubs or organizations: Not on file    Relationship status: Not on file  . Intimate partner violence:    Fear of current or ex partner: Not on file  Emotionally abused: Not on file    Physically abused: Not on file    Forced sexual activity: Not on file  Other Topics Concern  . Not on file  Social History Narrative  . Not on file    Review of Systems  Constitutional: Negative for fever and weight loss.  HENT: Negative for ear discharge, ear pain, hearing loss and tinnitus.   Eyes: Negative for blurred vision, double vision, photophobia and pain.  Respiratory: Negative for cough and shortness of breath.   Cardiovascular: Negative for chest pain and palpitations.  Gastrointestinal: Negative for abdominal pain, blood in stool, constipation, diarrhea, heartburn, melena, nausea and vomiting.  Genitourinary: Negative for dysuria, flank pain, frequency, hematuria and urgency.  Musculoskeletal: Negative for falls.  Skin: Positive for rash.  Neurological: Negative for dizziness, loss of consciousness and headaches.  Endo/Heme/Allergies: Negative for environmental allergies.  Psychiatric/Behavioral: Negative for depression,  hallucinations, substance abuse and suicidal ideas. The patient has insomnia. The patient is not nervous/anxious.    BP 108/78   Pulse 71   Temp 98 F (36.7 C) (Oral)   Resp 14   Ht 5\' 2"  (1.575 m)   Wt 209 lb (94.8 kg)   SpO2 98%   BMI 38.23 kg/m   Physical Exam  Constitutional: She is oriented to person, place, and time.  HENT:  Head: Normocephalic and atraumatic.  Right Ear: Tympanic membrane, external ear and ear canal normal.  Left Ear: Tympanic membrane, external ear and ear canal normal.  Nose: Nose normal. No mucosal edema.  Mouth/Throat: Uvula is midline, oropharynx is clear and moist and mucous membranes are normal. No oropharyngeal exudate or posterior oropharyngeal erythema.  Eyes: Pupils are equal, round, and reactive to light. Conjunctivae are normal.  Neck: Neck supple. No thyromegaly present.  Cardiovascular: Normal rate, regular rhythm, normal heart sounds and intact distal pulses.  Pulmonary/Chest: Effort normal and breath sounds normal. No respiratory distress. She has no wheezes. She has no rales.  Abdominal: Soft. Bowel sounds are normal. She exhibits no distension and no mass. There is no tenderness. There is no rebound and no guarding.  Lymphadenopathy:    She has no cervical adenopathy.  Neurological: She is alert and oriented to person, place, and time. No cranial nerve deficit.  Skin: Skin is warm and dry. No rash noted.     Vitals reviewed.  Assessment/Plan: Visit for preventive health examination Depression screen negative. Health Maintenance reviewed. Mammogram is scheduled for December. PAP up-to-date Preventive schedule discussed and handout given in AVS. Will obtain fasting labs today.   Primary insomnia Sleep hygiene practices reviewed. Will attempt trial or Pure ZZZs.  Eczema of right eyelid Moisturizer recommended. Rx Eucrisa to use.     Leeanne Rio, PA-C

## 2018-05-28 ENCOUNTER — Encounter: Payer: Self-pay | Admitting: Physician Assistant

## 2018-05-29 ENCOUNTER — Encounter: Payer: Self-pay | Admitting: Physician Assistant

## 2018-06-01 ENCOUNTER — Other Ambulatory Visit: Payer: Self-pay | Admitting: Physician Assistant

## 2018-06-01 MED ORDER — PHENTERMINE-TOPIRAMATE ER 7.5-46 MG PO CP24: ORAL_CAPSULE | ORAL | 2 refills | Status: DC

## 2018-06-01 MED ORDER — VITAMIN D (ERGOCALCIFEROL) 1.25 MG (50000 UNIT) PO CAPS: 50000.0000 [IU] | ORAL_CAPSULE | ORAL | 0 refills | Status: DC

## 2018-06-04 ENCOUNTER — Encounter: Payer: Self-pay | Admitting: Family Medicine

## 2018-06-04 ENCOUNTER — Ambulatory Visit (INDEPENDENT_AMBULATORY_CARE_PROVIDER_SITE_OTHER): Payer: BLUE CROSS/BLUE SHIELD | Admitting: Family Medicine

## 2018-06-04 DIAGNOSIS — M722 Plantar fascial fibromatosis: Secondary | ICD-10-CM

## 2018-06-04 MED ORDER — METHYLPREDNISOLONE ACETATE 40 MG/ML IJ SUSP
40.0000 mg | Freq: Once | INTRAMUSCULAR | Status: AC
  Administered 2018-06-04: 40 mg via INTRA_ARTICULAR

## 2018-06-04 NOTE — Progress Notes (Signed)
PCP: Brunetta Jeans, PA-C  Subjective:   HPI: Patient is a 47 y.o. female here for left heel pain.  12/3: Patient reports for a few weeks now she's had off and on pain plantar left heel medially. Pain is a twinge and comes and goes. Currently pain is 1/10 level. Has history of plantar fasciitis and has had injection in past - helps for about a year. Also has custom orthotics but feels current ones are breaking down. Worse first thing in the morning. No swelling or skin changes, numbness. Not taking any medicines for this.  11/14: Patient reports her plantar fascia pain is starting to come back as of 2-3 weeks ago. Pain level 2/10 now, a soreness plantar heel. Worse with walking, in morning. Needs a new arch binder. Has not been wearing orthotics. No skin changes, numbness.  Past Medical History:  Diagnosis Date  . History of chicken pox   . Venous thrombosis of leg    Left    Current Outpatient Medications on File Prior to Visit  Medication Sig Dispense Refill  . aspirin 81 MG tablet Take 81 mg by mouth daily.    Marland Kitchen CAMILA 0.35 MG tablet Take 1 tablet by mouth daily.  3  . Crisaborole (EUCRISA) 2 % OINT Apply 1 application topically daily. 60 g 1  . EPINEPHrine 0.3 mg/0.3 mL IJ SOAJ injection AS DIRECTED PRN SYSTEMIC REACTION INJECTION 30 DAYS  1  . hydrocortisone 2.5 % cream Apply 1 application topically 2 (two) times daily. 30 g 3  . Phentermine-Topiramate 7.5-46 MG CP24 Take 1 capsule PO QD 30 capsule 2  . Vitamin D, Ergocalciferol, (DRISDOL) 1.25 MG (50000 UT) CAPS capsule Take 1 capsule (50,000 Units total) by mouth every 7 (seven) days. 8 capsule 0   No current facility-administered medications on file prior to visit.     Past Surgical History:  Procedure Laterality Date  . BREAST SURGERY     Fibroidadenoma-Left  . CHOLECYSTECTOMY    . REFRACTIVE SURGERY     Bilateral  . ruptured disc    . TONSILLECTOMY    . WISDOM TOOTH EXTRACTION      Allergies   Allergen Reactions  . Amoxil [Amoxicillin] Hives  . Naproxen Rash    Social History   Socioeconomic History  . Marital status: Single    Spouse name: Not on file  . Number of children: Not on file  . Years of education: Not on file  . Highest education level: Not on file  Occupational History  . Not on file  Social Needs  . Financial resource strain: Not on file  . Food insecurity:    Worry: Not on file    Inability: Not on file  . Transportation needs:    Medical: Not on file    Non-medical: Not on file  Tobacco Use  . Smoking status: Never Smoker  . Smokeless tobacco: Never Used  Substance and Sexual Activity  . Alcohol use: Yes    Alcohol/week: 0.0 standard drinks    Comment: rare  . Drug use: No  . Sexual activity: Yes    Partners: Male    Birth control/protection: Pill    Comment: boyfriend  Lifestyle  . Physical activity:    Days per week: Not on file    Minutes per session: Not on file  . Stress: Not on file  Relationships  . Social connections:    Talks on phone: Not on file    Gets together: Not on  file    Attends religious service: Not on file    Active member of club or organization: Not on file    Attends meetings of clubs or organizations: Not on file    Relationship status: Not on file  . Intimate partner violence:    Fear of current or ex partner: Not on file    Emotionally abused: Not on file    Physically abused: Not on file    Forced sexual activity: Not on file  Other Topics Concern  . Not on file  Social History Narrative  . Not on file    Family History  Problem Relation Age of Onset  . Brain cancer Mother 68       Deceased-Tumor  . Glaucoma Maternal Grandfather   . Glaucoma Maternal Aunt   . Cancer Maternal Grandmother   . Lung cancer Maternal Aunt   . Healthy Sister        x1    BP (!) 122/91   Pulse 93   Ht 5\' 2"  (1.575 m)   Wt 209 lb (94.8 kg)   BMI 38.23 kg/m   Review of Systems: See HPI above.     Objective:   Physical Exam:  Gen: NAD, comfortable in exam room  Left foot/ankle: No gross deformity, swelling, ecchymoses FROM with 5/5 strength all directions. No TTP but pain typically at anterior calcaneus with walking per pt. Negative ant drawer and talar tilt.   Negative syndesmotic compression. Negative calcaneal squeeze. Thompsons test negative. NV intact distally.  Assessment & Plan:  1. Left plantar fasciitis - home exercises and stretches.  Encouraged arch binders and orthotics.  Repeated her injection.  Icing, tylenol if needed.  F/u prn.  After informed written consent timeout was performed.  Patient was seated on exam table.  Area overlying proximal plantar fascia medially prepped with alcohol swab then using ultrasound guidance patient's left plantar fascia was injected with 2:1 bupivicaine:depomedrol.  Patient tolerated procedure well without immediate complications.

## 2018-06-04 NOTE — Patient Instructions (Signed)
You have plantar fasciitis Take tylenol and/or aleve as needed for pain  Plantar fascia stretch for 20-30 seconds (do 3 of these) in morning Lowering/raise on a step exercises 3 x 10 once or twice a day - this is very important for long term recovery. Can add heel walks, toe walks forward and backward as well Ice heel for 15 minutes as needed. Avoid flat shoes/barefoot walking as much as possible. Arch straps have been shown to help with pain. Inserts are important (dr. Zoe Lan active series, spencos, our green insoles, custom orthotics). Steroid injection is a consideration for short term pain relief if you are struggling - you were given this today. Physical therapy is also an option. Follow up with me in 6 weeks or as needed if you're doing well. Consider getting fitted for shoes at [employer].

## 2018-06-25 DIAGNOSIS — Z1231 Encounter for screening mammogram for malignant neoplasm of breast: Secondary | ICD-10-CM | POA: Diagnosis not present

## 2018-06-25 LAB — HM MAMMOGRAPHY

## 2018-07-25 ENCOUNTER — Other Ambulatory Visit: Payer: Self-pay | Admitting: Physician Assistant

## 2018-08-04 ENCOUNTER — Encounter: Payer: Self-pay | Admitting: Emergency Medicine

## 2018-09-23 ENCOUNTER — Encounter: Payer: Self-pay | Admitting: Physician Assistant

## 2018-09-23 ENCOUNTER — Ambulatory Visit (INDEPENDENT_AMBULATORY_CARE_PROVIDER_SITE_OTHER): Payer: BLUE CROSS/BLUE SHIELD | Admitting: Physician Assistant

## 2018-09-23 ENCOUNTER — Other Ambulatory Visit: Payer: Self-pay

## 2018-09-23 VITALS — BP 116/86 | HR 71 | Temp 98.1°F | Resp 16 | Ht 62.0 in | Wt 209.0 lb

## 2018-09-23 DIAGNOSIS — R6889 Other general symptoms and signs: Secondary | ICD-10-CM

## 2018-09-23 DIAGNOSIS — R208 Other disturbances of skin sensation: Secondary | ICD-10-CM

## 2018-09-23 LAB — BASIC METABOLIC PANEL
BUN: 15 mg/dL (ref 6–23)
CO2: 29 mEq/L (ref 19–32)
Calcium: 8.8 mg/dL (ref 8.4–10.5)
Chloride: 106 mEq/L (ref 96–112)
Creatinine, Ser: 0.68 mg/dL (ref 0.40–1.20)
GFR: 92.09 mL/min (ref >60.00)
Glucose, Bld: 101 mg/dL — ABNORMAL HIGH (ref 70–99)
Potassium: 4.4 mEq/L (ref 3.5–5.1)
Sodium: 140 mEq/L (ref 135–145)

## 2018-09-23 LAB — SEDIMENTATION RATE: Sed Rate: 19 mm/hr (ref 0–20)

## 2018-09-23 LAB — TROPONIN I: TNIDX: 0 ug/l (ref 0.00–0.06)

## 2018-09-23 NOTE — Progress Notes (Signed)
Patient presents to clinic today c/o intermittent episodes of abnormal sensation in L shoulder and L leg, occurring a few times per day. Lasting only a couple of seconds each. Has happened in jaw bilaterally. Notes it is a pressure sensation. Denies chest pain, racing heart, LH or dizziness. Denies nausea/vomiting or heartburn. Denies change indiet, hydration or activity level. No recent trauma or injury. Some stressors but nothing major. Denies anxiety or panic attack. Denies vision change. Has history of migraine with some increase in frequency recently but no change in characteristics. Denies symptoms presently.   Past Medical History:  Diagnosis Date  . History of chicken pox   . Venous thrombosis of leg    Left    Current Outpatient Medications on File Prior to Visit  Medication Sig Dispense Refill  . aspirin 81 MG tablet Take 81 mg by mouth daily.    Marland Kitchen CAMILA 0.35 MG tablet Take 1 tablet by mouth daily.  3  . Crisaborole (EUCRISA) 2 % OINT Apply 1 application topically daily. 60 g 1  . EPINEPHrine 0.3 mg/0.3 mL IJ SOAJ injection AS DIRECTED PRN SYSTEMIC REACTION INJECTION 30 DAYS  1  . hydrocortisone 2.5 % cream Apply 1 application topically 2 (two) times daily. 30 g 3   No current facility-administered medications on file prior to visit.     Allergies  Allergen Reactions  . Amoxil [Amoxicillin] Hives  . Naproxen Rash    Family History  Problem Relation Age of Onset  . Brain cancer Mother 31       Deceased-Tumor  . Glaucoma Maternal Grandfather   . Glaucoma Maternal Aunt   . Cancer Maternal Grandmother   . Lung cancer Maternal Aunt   . Healthy Sister        x1    Social History   Socioeconomic History  . Marital status: Single    Spouse name: Not on file  . Number of children: Not on file  . Years of education: Not on file  . Highest education level: Not on file  Occupational History  . Not on file  Social Needs  . Financial resource strain: Not on file  .  Food insecurity:    Worry: Not on file    Inability: Not on file  . Transportation needs:    Medical: Not on file    Non-medical: Not on file  Tobacco Use  . Smoking status: Never Smoker  . Smokeless tobacco: Never Used  Substance and Sexual Activity  . Alcohol use: Yes    Alcohol/week: 0.0 standard drinks    Comment: rare  . Drug use: No  . Sexual activity: Yes    Partners: Male    Birth control/protection: Pill    Comment: boyfriend  Lifestyle  . Physical activity:    Days per week: Not on file    Minutes per session: Not on file  . Stress: Not on file  Relationships  . Social connections:    Talks on phone: Not on file    Gets together: Not on file    Attends religious service: Not on file    Active member of club or organization: Not on file    Attends meetings of clubs or organizations: Not on file    Relationship status: Not on file  Other Topics Concern  . Not on file  Social History Narrative  . Not on file   Review of Systems - See HPI.  All other ROS are negative.  BP 116/86  Pulse 71   Temp 98.1 F (36.7 C) (Oral)   Resp 16   Ht 5\' 2"  (1.575 m)   Wt 209 lb (94.8 kg)   SpO2 98%   BMI 38.23 kg/m   Physical Exam Vitals signs reviewed.  Constitutional:      Appearance: She is well-developed.  HENT:     Head: Normocephalic and atraumatic.     Mouth/Throat:     Mouth: Mucous membranes are moist.  Eyes:     Extraocular Movements:     Right eye: Normal extraocular motion and no nystagmus.     Left eye: Normal extraocular motion and no nystagmus.     Pupils: Pupils are equal, round, and reactive to light.  Neck:     Musculoskeletal: Normal range of motion and neck supple. No neck rigidity.  Cardiovascular:     Rate and Rhythm: Normal rate and regular rhythm.     Heart sounds: Normal heart sounds.  Pulmonary:     Effort: Pulmonary effort is normal.     Breath sounds: Normal breath sounds.  Chest:     Chest wall: No tenderness.    Musculoskeletal: Normal range of motion.        General: No swelling or tenderness.  Lymphadenopathy:     Cervical: No cervical adenopathy.  Neurological:     Mental Status: She is alert and oriented to person, place, and time.     Cranial Nerves: No cranial nerve deficit or facial asymmetry.     Sensory: No sensory deficit.     Motor: No weakness.     Gait: Gait normal.     Deep Tendon Reflexes: Reflexes normal.  Psychiatric:        Mood and Affect: Mood normal.      Assessment/Plan: 1. Sensation of pressure EKG unremarkable today -- NSR. Exam unremarkable. Asymptomatic. Very bizarre constellation of symptoms. Non-exertional. Neurological exam normal today. Will check troponin for reassurance. Labs as noted below. Will have her keep a symptom journal. IF panel negative, follow-up in 1 week for review of symptom journal. ER precautions reviewed with patient.   - EKG 12-Lead - Sedimentation rate - Antinuclear Antib (ANA) - Troponin I - Basic metabolic panel   Leeanne Rio, PA-C

## 2018-09-23 NOTE — Patient Instructions (Signed)
Please go to the lab today for blood work.  I will call you with your results. We will alter treatment regimen(s) if indicated by your results.   Your EKG is normal today which is reassuring. Symptoms do not seem cardiac as they are non-exertional.  I am checking some labs to further assess things.  I am holding off on imaging for now.  I want you to keep a journal to log symptoms -- timing, location and duration. I also want you to keep hydrated and add on an electrolyte water in case the sensation is intermittent spasming of muscles.  Try to relax as much as possible.  Follow-up with me in early next week for reassessment.  If labs negative and symptoms persistent we will need to consider imaging of vasculature in the neck.

## 2018-09-25 LAB — ANA: Anti Nuclear Antibody(ANA): NEGATIVE

## 2019-01-31 ENCOUNTER — Encounter: Payer: Self-pay | Admitting: Physician Assistant

## 2019-02-15 ENCOUNTER — Other Ambulatory Visit: Payer: Self-pay

## 2019-02-15 ENCOUNTER — Encounter: Payer: Self-pay | Admitting: Family Medicine

## 2019-02-15 ENCOUNTER — Ambulatory Visit (INDEPENDENT_AMBULATORY_CARE_PROVIDER_SITE_OTHER): Payer: BC Managed Care – PPO | Admitting: Family Medicine

## 2019-02-15 DIAGNOSIS — M25462 Effusion, left knee: Secondary | ICD-10-CM | POA: Insufficient documentation

## 2019-02-15 NOTE — Progress Notes (Signed)
   PCP: Brunetta Jeans, PA-C  Subjective:   HPI: Patient is a 48 y.o. female here for left knee "ache" and swelling x ~1 month. She notes left LE swelling and anterior knee pain. She notes primarily occurs with stairs and first thing in the morning. Excessive bending causes a "pulling" sensation along anterior knee. Denies any trauma. Patient has history of DVT thus she went to OSH ED on 7/13 and had LE doppler which was negative. Treated with Voltaren gel which she does not feel is making a difference.   Review of Systems:  Per HPI.  No skin changes.  [place], medications and smoking status reviewed.      Objective:  Physical Exam:  Gen: awake, alert, NAD, comfortable in exam room Pulm: breathing unlabored  Knee, Left: - Inspection: no gross deformity. Mild anterioinferior knee swelling. No erythema or bruising. Skin intact.  Mild effusion. - Palpation: no TTP - ROM: full active ROM with flexion and extension in knee and hip - Strength: 5/5 strength - Neuro/vasc: NV intact - Special Tests: - LIGAMENTS: negative anterior and posterior drawer, negative Lachman's, no MCL or LCL laxity  -- MENISCUS: negative McMurray's, negative Thessaly  -- PF JOINT: nml patellar mobility bilaterally.  negative patellar grind, negative patellar apprehension  Knee, Right: - Inspection: no gross deformity. No swelling/effusion, erythema or bruising. Skin intact - Palpation: no TTP - ROM: full active ROM with flexion and extension in knee and hip - Strength: 5/5 strength - Neuro/vasc: NV intact - Special Tests: - LIGAMENTS: negative anterior and posterior drawer, negative Lachman's, no MCL or LCL laxity  -- MENISCUS: negative McMurray's, negative Thessaly   Hips: normal ROM, negative FABER and FADIR bilaterally  MSK ultrasound: Left Knee Left: Images were obtained both in the transverse and longitudinal plane. Patellar and quadriceps tendons were well visualized with no abnormalities.  Moderate effusion noted in suprapatellar pouch. Infrapatellar bursa WNL without signs of inflammation   Impression: left knee joint effusion   Ultrasound and interpretation by Dr. Tarry Kos and Karlton Lemon, MD  Assessment & Plan:   Effusion of left knee joint Ultrasound consistent with left knee effusion. No signs of bursitis or patellar tendinopathy. Differential includes mild arthritis vs synovitis. Meniscal tear very unlikely based on history and exam. Do not suspect infection or gout. Discussed conservative treatment at this time as pain is very mild for patient. Knee brace PRN, tylenol/ibuprofen for pain as needed, ice 42min 3x/day. RTC in 1 month if no improvement or sooner if worsening. Can consider imaging vs aspiration/steroid injection at that time.  Mina Marble, DO [facility], PGY2 02/15/2019 10:58 AM

## 2019-02-15 NOTE — Assessment & Plan Note (Signed)
Ultrasound consistent with left knee effusion. No signs of bursitis or patellar tendinopathy. Differential includes mild arthritis vs synovitis. Meniscal tear very unlikely based on history and exam. Do not suspect infection or gout. Discussed conservative treatment at this time as pain is very mild for patient. Knee brace PRN, tylenol/ibuprofen for pain as needed, ice 11min 3x/day. RTC in 1 month if no improvement or sooner if worsening. Can consider imaging vs aspiration/steroid injection at that time.

## 2019-02-15 NOTE — Patient Instructions (Signed)
You have a knee effusion but your exam is otherwise normal and reassuring. Ice the knee 15 minutes at a time 3-4 times a day. Compression sleeve or ACE wrap during the day to keep the swelling down. Elevate above the level of your heart as much as possible. Ibuprofen 600mg  three times a day with food for 7 days then as needed. If this continues to bother you we can drain and inject this but it's typically not necessary unless we're worried about infection or gout (which we are not!). Follow up with me in 1 month or as needed if you're doing well.

## 2019-03-10 ENCOUNTER — Ambulatory Visit (INDEPENDENT_AMBULATORY_CARE_PROVIDER_SITE_OTHER): Payer: BC Managed Care – PPO | Admitting: Family Medicine

## 2019-03-10 ENCOUNTER — Other Ambulatory Visit: Payer: Self-pay

## 2019-03-10 ENCOUNTER — Encounter: Payer: Self-pay | Admitting: Family Medicine

## 2019-03-10 VITALS — BP 130/84 | Wt 205.0 lb

## 2019-03-10 DIAGNOSIS — M25462 Effusion, left knee: Secondary | ICD-10-CM | POA: Diagnosis not present

## 2019-03-10 MED ORDER — METHYLPREDNISOLONE ACETATE 40 MG/ML IJ SUSP
40.0000 mg | Freq: Once | INTRAMUSCULAR | Status: AC
  Administered 2019-03-10: 40 mg via INTRA_ARTICULAR

## 2019-03-10 NOTE — Progress Notes (Signed)
PCP: Brunetta Jeans, PA-C  Subjective:   HPI: Patient is a 48 y.o. female here for left knee injection.  7/27: Patient is a 48 y.o. female here for left knee "ache" and swelling x ~1 month. She notes left LE swelling and anterior knee pain. She notes primarily occurs with stairs and first thing in the morning. Excessive bending causes a "pulling" sensation along anterior knee. Denies any trauma. Patient has history of DVT thus she went to OSH ED on 7/13 and had LE doppler which was negative. Treated with Voltaren gel which she does not feel is making a difference.   8/19: Patient returns today for aspiration and injection of her left knee.  Past Medical History:  Diagnosis Date  . History of chicken pox   . Venous thrombosis of leg    Left    Current Outpatient Medications on File Prior to Visit  Medication Sig Dispense Refill  . aspirin 81 MG tablet Take 81 mg by mouth daily.    Marland Kitchen CAMILA 0.35 MG tablet Take 1 tablet by mouth daily.  3  . Crisaborole (EUCRISA) 2 % OINT Apply 1 application topically daily. 60 g 1  . EPINEPHrine 0.3 mg/0.3 mL IJ SOAJ injection AS DIRECTED PRN SYSTEMIC REACTION INJECTION 30 DAYS  1  . hydrocortisone 2.5 % cream Apply 1 application topically 2 (two) times daily. 30 g 3   No current facility-administered medications on file prior to visit.     Past Surgical History:  Procedure Laterality Date  . BREAST SURGERY     Fibroidadenoma-Left  . CHOLECYSTECTOMY    . REFRACTIVE SURGERY     Bilateral  . ruptured disc    . TONSILLECTOMY    . WISDOM TOOTH EXTRACTION      Allergies  Allergen Reactions  . Amoxil [Amoxicillin] Hives  . Naproxen Rash    Social History   Socioeconomic History  . Marital status: Single    Spouse name: Not on file  . Number of children: Not on file  . Years of education: Not on file  . Highest education level: Not on file  Occupational History  . Not on file  Social Needs  . Financial resource strain: Not on  file  . Food insecurity    Worry: Not on file    Inability: Not on file  . Transportation needs    Medical: Not on file    Non-medical: Not on file  Tobacco Use  . Smoking status: Never Smoker  . Smokeless tobacco: Never Used  Substance and Sexual Activity  . Alcohol use: Yes    Alcohol/week: 0.0 standard drinks    Comment: rare  . Drug use: No  . Sexual activity: Yes    Partners: Male    Birth control/protection: Pill    Comment: boyfriend  Lifestyle  . Physical activity    Days per week: Not on file    Minutes per session: Not on file  . Stress: Not on file  Relationships  . Social Herbalist on phone: Not on file    Gets together: Not on file    Attends religious service: Not on file    Active member of club or organization: Not on file    Attends meetings of clubs or organizations: Not on file    Relationship status: Not on file  . Intimate partner violence    Fear of current or ex partner: Not on file    Emotionally abused: Not on  file    Physically abused: Not on file    Forced sexual activity: Not on file  Other Topics Concern  . Not on file  Social History Narrative  . Not on file    Family History  Problem Relation Age of Onset  . Brain cancer Mother 69       Deceased-Tumor  . Glaucoma Maternal Grandfather   . Glaucoma Maternal Aunt   . Cancer Maternal Grandmother   . Lung cancer Maternal Aunt   . Healthy Sister        x1    BP 130/84   Wt 205 lb (93 kg)   BMI 37.49 kg/m   Review of Systems: See HPI above.     Objective:  Physical Exam:  Gen: NAD, comfortable in exam room  Left knee: Effusion noted and confirmed with ultrasound. Rest of examination not repeated today.   Assessment & Plan:  1. Left knee pain and effusion - patient continues to struggle with this despite icing, ibuprofen, brace, elevation.  Discussed likely due to mild DJD vs synovitis.  Aspiration and injection performed today.  Fluid sent for cell count,  crystal analysis.  Will call patient with results.    After informed written consent timeout was performed, patient was lying supine on exam table.  Left knee was prepped with alcohol swab.  Utilizing superolateral approach with ultrasound guidance, 3 mL of bupivicaine was used for local anesthesia.  Then using an 18g needle on 60cc syringe, 10 mL of clear straw-colored fluid was aspirated from left knee.  Knee was then injected with 3:1 bupivicaine:depomedrol.  Patient tolerated procedure well without immediate complications.

## 2019-03-11 LAB — SYNOVIAL FLUID, CELL COUNT
Eos, Fluid: 0 %
Lining Cells, Synovial: 0 %
Lymphs, Fluid: 14 %
Macrophages Fld: 77 %
Nuc cell # Fld: 364 cells/uL — ABNORMAL HIGH (ref 0–200)
Polys, Fluid: 9 %
RBC, Fluid: 2000 /uL

## 2019-03-12 ENCOUNTER — Encounter: Payer: Self-pay | Admitting: Family Medicine

## 2019-03-15 ENCOUNTER — Encounter: Payer: Self-pay | Admitting: Physician Assistant

## 2019-03-15 ENCOUNTER — Other Ambulatory Visit: Payer: Self-pay | Admitting: Physician Assistant

## 2019-03-15 DIAGNOSIS — E669 Obesity, unspecified: Secondary | ICD-10-CM

## 2019-03-19 ENCOUNTER — Ambulatory Visit (INDEPENDENT_AMBULATORY_CARE_PROVIDER_SITE_OTHER): Payer: BC Managed Care – PPO

## 2019-03-19 ENCOUNTER — Other Ambulatory Visit: Payer: Self-pay

## 2019-03-19 DIAGNOSIS — Z23 Encounter for immunization: Secondary | ICD-10-CM

## 2019-04-01 ENCOUNTER — Other Ambulatory Visit: Payer: Self-pay

## 2019-04-01 DIAGNOSIS — M25462 Effusion, left knee: Secondary | ICD-10-CM

## 2019-04-05 DIAGNOSIS — M25462 Effusion, left knee: Secondary | ICD-10-CM | POA: Diagnosis not present

## 2019-04-05 DIAGNOSIS — M94262 Chondromalacia, left knee: Secondary | ICD-10-CM | POA: Diagnosis not present

## 2019-04-05 DIAGNOSIS — M23342 Other meniscus derangements, anterior horn of lateral meniscus, left knee: Secondary | ICD-10-CM | POA: Diagnosis not present

## 2019-04-05 DIAGNOSIS — M23322 Other meniscus derangements, posterior horn of medial meniscus, left knee: Secondary | ICD-10-CM | POA: Diagnosis not present

## 2019-04-07 DIAGNOSIS — Z1231 Encounter for screening mammogram for malignant neoplasm of breast: Secondary | ICD-10-CM | POA: Diagnosis not present

## 2019-04-07 DIAGNOSIS — Z01419 Encounter for gynecological examination (general) (routine) without abnormal findings: Secondary | ICD-10-CM | POA: Diagnosis not present

## 2019-04-07 DIAGNOSIS — Z6841 Body Mass Index (BMI) 40.0 and over, adult: Secondary | ICD-10-CM | POA: Diagnosis not present

## 2019-04-07 LAB — HM MAMMOGRAPHY

## 2019-04-12 ENCOUNTER — Encounter: Payer: Self-pay | Admitting: Physician Assistant

## 2019-04-12 ENCOUNTER — Other Ambulatory Visit: Payer: Self-pay

## 2019-04-12 ENCOUNTER — Encounter: Payer: Self-pay | Admitting: Family Medicine

## 2019-04-12 ENCOUNTER — Ambulatory Visit (INDEPENDENT_AMBULATORY_CARE_PROVIDER_SITE_OTHER): Payer: BC Managed Care – PPO | Admitting: Family Medicine

## 2019-04-12 VITALS — BP 132/88 | Ht 62.0 in | Wt 215.0 lb

## 2019-04-12 DIAGNOSIS — M25462 Effusion, left knee: Secondary | ICD-10-CM

## 2019-04-12 NOTE — Progress Notes (Signed)
Patient returned today to go over MRI results.  These were reassuring.  Some degenerative changes of meniscus, chondral delamination, chondromalacia, arthritic changes.  Effusion also seen as expected.  Reassured patient.  Continue with home exercises.  Topical medications, knee brace, tylenol, aleve if needed.  Consider viscosupplementation.  F/u in 6 weeks.

## 2019-04-12 NOTE — Patient Instructions (Signed)
Your pain is due to arthritis. These are the different medications you can take for this: Tylenol 500mg  1-2 tabs three times a day for pain. Voltaren gel, Capsaicin, or aspercreme topically up to four times a day may also help with pain. Some supplements that may help for arthritis: Boswellia extract, curcumin, pycnogenol Aleve 1-2 tabs twice a day with food if needed. Cortisone injections are an option - we've tried this. If cortisone injections do not help, there are different types of shots that may help but they take longer to take effect - the gel shots - let us know if you want to do these in the future and we can check on approval. It's important that you continue to stay active. Straight leg raises, knee extensions 3 sets of 10 once a day (add ankle weight if these become too easy). Shoe inserts with good arch support may be helpful. Heat or ice 15 minutes at a time 3-4 times a day as needed to help with pain. Knee brace with kneecap cutout when exercising. Water aerobics and cycling with low resistance are the best two types of exercise for arthritis though any exercise is ok as long as it doesn't worsen the pain. Follow up with me in 6 weeks.

## 2019-05-12 ENCOUNTER — Encounter: Payer: Self-pay | Admitting: Family Medicine

## 2019-05-17 ENCOUNTER — Ambulatory Visit (INDEPENDENT_AMBULATORY_CARE_PROVIDER_SITE_OTHER): Payer: BC Managed Care – PPO | Admitting: Family Medicine

## 2019-05-17 ENCOUNTER — Other Ambulatory Visit: Payer: Self-pay

## 2019-05-17 VITALS — BP 134/96 | Ht 62.0 in | Wt 210.0 lb

## 2019-05-17 DIAGNOSIS — M25562 Pain in left knee: Secondary | ICD-10-CM

## 2019-05-17 DIAGNOSIS — M25561 Pain in right knee: Secondary | ICD-10-CM | POA: Diagnosis not present

## 2019-05-17 MED ORDER — METHYLPREDNISOLONE ACETATE 40 MG/ML IJ SUSP
40.0000 mg | Freq: Once | INTRAMUSCULAR | Status: AC
  Administered 2019-05-17: 40 mg via INTRA_ARTICULAR

## 2019-05-18 ENCOUNTER — Encounter: Payer: Self-pay | Admitting: Family Medicine

## 2019-05-18 NOTE — Progress Notes (Signed)
PCP: Brunetta Jeans, PA-C  Subjective:   HPI: Patient is a 48 y.o. female here for left knee injection, with right knee pain also.  7/27: Patient is a 48 y.o. female here for left knee "ache" and swelling x ~1 month. She notes left LE swelling and anterior knee pain. She notes primarily occurs with stairs and first thing in the morning. Excessive bending causes a "pulling" sensation along anterior knee. Denies any trauma. Patient has history of DVT thus she went to OSH ED on 7/13 and had LE doppler which was negative. Treated with Voltaren gel which she does not feel is making a difference.   8/19: Patient returns today for aspiration and injection of her left knee.  10/26: Patient and I have been discussing her knees via mychart. She has localized pain anterior right knee just inferior to patella that only hurts with direct pressure and palpation. Left knee has been sore, stiff with swelling. Worse with stairs. She's interested in repeating aspiration/injection of left knee today. No skin changes.  Past Medical History:  Diagnosis Date  . History of chicken pox   . Venous thrombosis of leg    Left    Current Outpatient Medications on File Prior to Visit  Medication Sig Dispense Refill  . aspirin 81 MG tablet Take 81 mg by mouth daily.    Marland Kitchen CAMILA 0.35 MG tablet Take 1 tablet by mouth daily.  3  . Crisaborole (EUCRISA) 2 % OINT Apply 1 application topically daily. 60 g 1  . hydrocortisone 2.5 % cream Apply 1 application topically 2 (two) times daily. 30 g 3  . clindamycin (CLEOCIN) 150 MG capsule     . clobetasol ointment (TEMOVATE) 0.05 %     . EPINEPHrine 0.3 mg/0.3 mL IJ SOAJ injection AS DIRECTED PRN SYSTEMIC REACTION INJECTION 30 DAYS  1   No current facility-administered medications on file prior to visit.     Past Surgical History:  Procedure Laterality Date  . BREAST SURGERY     Fibroidadenoma-Left  . CHOLECYSTECTOMY    . REFRACTIVE SURGERY     Bilateral  .  ruptured disc    . TONSILLECTOMY    . WISDOM TOOTH EXTRACTION      Allergies  Allergen Reactions  . Amoxil [Amoxicillin] Hives  . Naproxen Rash    Social History   Socioeconomic History  . Marital status: Single    Spouse name: Not on file  . Number of children: Not on file  . Years of education: Not on file  . Highest education level: Not on file  Occupational History  . Not on file  Social Needs  . Financial resource strain: Not on file  . Food insecurity    Worry: Not on file    Inability: Not on file  . Transportation needs    Medical: Not on file    Non-medical: Not on file  Tobacco Use  . Smoking status: Never Smoker  . Smokeless tobacco: Never Used  Substance and Sexual Activity  . Alcohol use: Yes    Alcohol/week: 0.0 standard drinks    Comment: rare  . Drug use: No  . Sexual activity: Yes    Partners: Male    Birth control/protection: Pill    Comment: boyfriend  Lifestyle  . Physical activity    Days per week: Not on file    Minutes per session: Not on file  . Stress: Not on file  Relationships  . Social connections  Talks on phone: Not on file    Gets together: Not on file    Attends religious service: Not on file    Active member of club or organization: Not on file    Attends meetings of clubs or organizations: Not on file    Relationship status: Not on file  . Intimate partner violence    Fear of current or ex partner: Not on file    Emotionally abused: Not on file    Physically abused: Not on file    Forced sexual activity: Not on file  Other Topics Concern  . Not on file  Social History Narrative  . Not on file    Family History  Problem Relation Age of Onset  . Brain cancer Mother 53       Deceased-Tumor  . Glaucoma Maternal Grandfather   . Glaucoma Maternal Aunt   . Cancer Maternal Grandmother   . Lung cancer Maternal Aunt   . Healthy Sister        x1    BP (!) 134/96   Ht 5\' 2"  (1.575 m)   Wt 210 lb (95.3 kg)   BMI  38.41 kg/m   Review of Systems: See HPI above.     Objective:  Physical Exam:  Gen: NAD, comfortable in exam room  Right knee: No gross deformity, ecchymoses, effusion. Mild TTP lateral aspect of patellar tendon reproducing her pain.  No joint line or other tenderness. FROM with 5/5 strength flexion and extension. Negative ant/post drawers. Negative valgus/varus testing. Negative lachmans. Negative mcmurrays, apleys, patellar apprehension. NV intact distally.  Left knee: Minimal effusion - noted on ultrasound as well.   Assessment & Plan:  1. Left knee pain and effusion - minimal effusion today.  MRI was reassuring with degenerative changes and effusion.  Viscosupplementation not covered by her insurance.  Went ahead with injection today as not enough of an effusion to aspirate.  Icing, aleve, tylenol if needed.  After informed written consent timeout was performed, patient was lying supine on exam table. Left knee was prepped with alcohol swab and utilizing superolateral approach with ultrasound guidance, patient's left knee was injected intraarticularly with 3:1 bupivicaine: depomedrol. Patient tolerated the procedure well without immediate complications.  2. Right knee pain - consistent with mild patellar tendinitis.  Shown home exercises to do daily.

## 2019-07-05 ENCOUNTER — Ambulatory Visit (INDEPENDENT_AMBULATORY_CARE_PROVIDER_SITE_OTHER): Payer: BC Managed Care – PPO | Admitting: Physician Assistant

## 2019-07-05 ENCOUNTER — Ambulatory Visit (INDEPENDENT_AMBULATORY_CARE_PROVIDER_SITE_OTHER): Payer: BC Managed Care – PPO | Admitting: Family Medicine

## 2019-07-05 ENCOUNTER — Other Ambulatory Visit: Payer: Self-pay

## 2019-07-05 ENCOUNTER — Encounter: Payer: Self-pay | Admitting: Physician Assistant

## 2019-07-05 VITALS — BP 118/80 | HR 72 | Temp 98.2°F | Resp 16 | Ht 62.0 in | Wt 222.0 lb

## 2019-07-05 VITALS — BP 124/86 | Ht 62.0 in | Wt 215.0 lb

## 2019-07-05 DIAGNOSIS — Z0001 Encounter for general adult medical examination with abnormal findings: Secondary | ICD-10-CM

## 2019-07-05 DIAGNOSIS — R29898 Other symptoms and signs involving the musculoskeletal system: Secondary | ICD-10-CM | POA: Diagnosis not present

## 2019-07-05 DIAGNOSIS — M722 Plantar fascial fibromatosis: Secondary | ICD-10-CM | POA: Diagnosis not present

## 2019-07-05 DIAGNOSIS — D1722 Benign lipomatous neoplasm of skin and subcutaneous tissue of left arm: Secondary | ICD-10-CM | POA: Diagnosis not present

## 2019-07-05 DIAGNOSIS — K219 Gastro-esophageal reflux disease without esophagitis: Secondary | ICD-10-CM

## 2019-07-05 DIAGNOSIS — Z0000 Encounter for general adult medical examination without abnormal findings: Secondary | ICD-10-CM

## 2019-07-05 LAB — LIPID PANEL
Cholesterol: 150 mg/dL (ref 0–200)
HDL: 46.2 mg/dL (ref >39.00)
LDL Cholesterol: 79 mg/dL (ref 0–99)
NonHDL: 103.51
Total CHOL/HDL Ratio: 3
Triglycerides: 121 mg/dL (ref 0.0–149.0)
VLDL: 24.2 mg/dL (ref 0.0–40.0)

## 2019-07-05 LAB — CBC WITH DIFFERENTIAL/PLATELET
Basophils Absolute: 0 10*3/uL (ref 0.0–0.1)
Basophils Relative: 0.5 % (ref 0.0–3.0)
Eosinophils Absolute: 0.2 10*3/uL (ref 0.0–0.7)
Eosinophils Relative: 3.2 % (ref 0.0–5.0)
HCT: 43.7 % (ref 36.0–46.0)
Hemoglobin: 14.6 g/dL (ref 12.0–15.0)
Lymphocytes Relative: 22.7 % (ref 12.0–46.0)
Lymphs Abs: 1.3 10*3/uL (ref 0.7–4.0)
MCHC: 33.3 g/dL (ref 30.0–36.0)
MCV: 88.5 fl (ref 78.0–100.0)
Monocytes Absolute: 0.5 10*3/uL (ref 0.1–1.0)
Monocytes Relative: 8 % (ref 3.0–12.0)
Neutro Abs: 3.8 10*3/uL (ref 1.4–7.7)
Neutrophils Relative %: 65.6 % (ref 43.0–77.0)
Platelets: 104 10*3/uL — ABNORMAL LOW (ref 150.0–400.0)
RBC: 4.94 Mil/uL (ref 3.87–5.11)
RDW: 14.3 % (ref 11.5–15.5)
WBC: 5.8 10*3/uL (ref 4.0–10.5)

## 2019-07-05 LAB — COMPREHENSIVE METABOLIC PANEL
ALT: 26 U/L (ref 0–35)
AST: 21 U/L (ref 0–37)
Albumin: 3.6 g/dL (ref 3.5–5.2)
Alkaline Phosphatase: 61 U/L (ref 39–117)
BUN: 14 mg/dL (ref 6–23)
CO2: 26 mEq/L (ref 19–32)
Calcium: 8.3 mg/dL — ABNORMAL LOW (ref 8.4–10.5)
Chloride: 107 mEq/L (ref 96–112)
Creatinine, Ser: 0.68 mg/dL (ref 0.40–1.20)
GFR: 91.8 mL/min (ref >60.00)
Glucose, Bld: 104 mg/dL — ABNORMAL HIGH (ref 70–99)
Potassium: 4.1 mEq/L (ref 3.5–5.1)
Sodium: 139 mEq/L (ref 135–145)
Total Bilirubin: 0.6 mg/dL (ref 0.2–1.2)
Total Protein: 5.7 g/dL — ABNORMAL LOW (ref 6.0–8.3)

## 2019-07-05 LAB — HEMOGLOBIN A1C: Hgb A1c MFr Bld: 6 % (ref 4.6–6.5)

## 2019-07-05 MED ORDER — PANTOPRAZOLE SODIUM 40 MG PO TBEC: 40.0000 mg | DELAYED_RELEASE_TABLET | Freq: Every day | ORAL | 3 refills | Status: DC

## 2019-07-05 MED ORDER — METHYLPREDNISOLONE ACETATE 40 MG/ML IJ SUSP
40.0000 mg | Freq: Once | INTRAMUSCULAR | Status: AC
  Administered 2019-07-05: 40 mg via INTRA_ARTICULAR

## 2019-07-05 NOTE — Patient Instructions (Addendum)
You have plantar fasciitis Take tylenol as needed for pain. Plantar fascia stretch for 20-30 seconds (do 3 of these) in morning Lowering/raise on a step exercises 3 x 10 once or twice a day - this is very important for long term recovery. Can add heel walks, toe walks forward and backward as well Ice heel for 15 minutes as needed. Avoid flat shoes/barefoot walking as much as possible. Arch straps have been shown to help with pain. Inserts are important (dr. Zoe Lan active series, spencos, our green insoles, custom orthotics). Steroid injection is a consideration for short term pain relief if you are struggling - you were given this today. Physical therapy is also an option. Follow up with me as needed for this.

## 2019-07-05 NOTE — Patient Instructions (Signed)
Please go to the lab for blood work.   Our office will call you with your results unless you have chosen to receive results via MyChart.  If your blood work is normal we will follow-up each year for physicals and as scheduled for chronic medical problems.  If anything is abnormal we will treat accordingly and get you in for a follow-up.  For the jaw, try to use topical Icy Hot gel over-the-counter to apply around the ears and the jaw joint there. Please consider letting me set you up with a specialist for TMJ if symptoms are not improving.   For the burning sensation, please avoid late-night eating and follow the dietary recommendations below. Take the Protonix once daily for 2 weeks. Let's follow-up via video visit in 2 weeks to reassess.   The area on your arm is most consistent with a benign lipoma. Again only needs to be removed if desired for cosmetic purposes. We can help set this up at any time if you decide you would like to.    Food Choices for Gastroesophageal Reflux Disease, Adult When you have gastroesophageal reflux disease (GERD), the foods you eat and your eating habits are very important. Choosing the right foods can help ease your discomfort. Think about working with a nutrition specialist (dietitian) to help you make good choices. What are tips for following this plan?  Meals  Choose healthy foods that are low in fat, such as fruits, vegetables, whole grains, low-fat dairy products, and lean meat, fish, and poultry.  Eat small meals often instead of 3 large meals a day. Eat your meals slowly, and in a place where you are relaxed. Avoid bending over or lying down until 2-3 hours after eating.  Avoid eating meals 2-3 hours before bed.  Avoid drinking a lot of liquid with meals.  Cook foods using methods other than frying. Bake, grill, or broil food instead.  Avoid or limit: ? Chocolate. ? Peppermint or spearmint. ? Alcohol. ? Pepper. ? Black and decaffeinated  coffee. ? Black and decaffeinated tea. ? Bubbly (carbonated) soft drinks. ? Caffeinated energy drinks and soft drinks.  Limit high-fat foods such as: ? Fatty meat or fried foods. ? Whole milk, cream, butter, or ice cream. ? Nuts and nut butters. ? Pastries, donuts, and sweets made with butter or shortening.  Avoid foods that cause symptoms. These foods may be different for everyone. Common foods that cause symptoms include: ? Tomatoes. ? Oranges, lemons, and limes. ? Peppers. ? Spicy food. ? Onions and garlic. ? Vinegar. Lifestyle  Maintain a healthy weight. Ask your doctor what weight is healthy for you. If you need to lose weight, work with your doctor to do so safely.  Exercise for at least 30 minutes for 5 or more days each week, or as told by your doctor.  Wear loose-fitting clothes.  Do not smoke. If you need help quitting, ask your doctor.  Sleep with the head of your bed higher than your feet. Use a wedge under the mattress or blocks under the bed frame to raise the head of the bed. Summary  When you have gastroesophageal reflux disease (GERD), food and lifestyle choices are very important in easing your symptoms.  Eat small meals often instead of 3 large meals a day. Eat your meals slowly, and in a place where you are relaxed.  Limit high-fat foods such as fatty meat or fried foods.  Avoid bending over or lying down until 2-3 hours after eating.  Avoid peppermint and spearmint, caffeine, alcohol, and chocolate. This information is not intended to replace advice given to you by your health care provider. Make sure you discuss any questions you have with your health care provider. Document Released: 01/07/2012 Document Revised: 10/29/2018 Document Reviewed: 08/13/2016 Elsevier Patient Education  2020 [employer]      Preventive Care 58-81 Years Old, Female Preventive care refers to visits with your health care provider and lifestyle choices that can promote  health and wellness. This includes:  A yearly physical exam. This may also be called an annual well check.  Regular dental visits and eye exams.  Immunizations.  Screening for certain conditions.  Healthy lifestyle choices, such as eating a healthy diet, getting regular exercise, not using drugs or products that contain nicotine and tobacco, and limiting alcohol use. What can I expect for my preventive care visit? Physical exam Your health care provider will check your:  Height and weight. This may be used to calculate body mass index (BMI), which tells if you are at a healthy weight.  Heart rate and blood pressure.  Skin for abnormal spots. Counseling Your health care provider may ask you questions about your:  Alcohol, tobacco, and drug use.  Emotional well-being.  Home and relationship well-being.  Sexual activity.  Eating habits.  Work and work Statistician.  Method of birth control.  Menstrual cycle.  Pregnancy history. What immunizations do I need?  Influenza (flu) vaccine  This is recommended every year. Tetanus, diphtheria, and pertussis (Tdap) vaccine  You may need a Td booster every 10 years. Varicella (chickenpox) vaccine  You may need this if you have not been vaccinated. Zoster (shingles) vaccine  You may need this after age 74. Measles, mumps, and rubella (MMR) vaccine  You may need at least one dose of MMR if you were born in 1957 or later. You may also need a second dose. Pneumococcal conjugate (PCV13) vaccine  You may need this if you have certain conditions and were not previously vaccinated. Pneumococcal polysaccharide (PPSV23) vaccine  You may need one or two doses if you smoke cigarettes or if you have certain conditions. Meningococcal conjugate (MenACWY) vaccine  You may need this if you have certain conditions. Hepatitis A vaccine  You may need this if you have certain conditions or if you travel or work in places where you may  be exposed to hepatitis A. Hepatitis B vaccine  You may need this if you have certain conditions or if you travel or work in places where you may be exposed to hepatitis B. Haemophilus influenzae type b (Hib) vaccine  You may need this if you have certain conditions. Human papillomavirus (HPV) vaccine  If recommended by your health care provider, you may need three doses over 6 months. You may receive vaccines as individual doses or as more than one vaccine together in one shot (combination vaccines). Talk with your health care provider about the risks and benefits of combination vaccines. What tests do I need? Blood tests  Lipid and cholesterol levels. These may be checked every 5 years, or more frequently if you are over 47 years old.  Hepatitis C test.  Hepatitis B test. Screening  Lung cancer screening. You may have this screening every year starting at age 20 if you have a 30-pack-year history of smoking and currently smoke or have quit within the past 15 years.  Colorectal cancer screening. All adults should have this screening starting at age 88 and continuing until age  26. Your health care provider may recommend screening at age 23 if you are at increased risk. You will have tests every 1-10 years, depending on your results and the type of screening test.  Diabetes screening. This is done by checking your blood sugar (glucose) after you have not eaten for a while (fasting). You may have this done every 1-3 years.  Mammogram. This may be done every 1-2 years. Talk with your health care provider about when you should start having regular mammograms. This may depend on whether you have a family history of breast cancer.  BRCA-related cancer screening. This may be done if you have a family history of breast, ovarian, tubal, or peritoneal cancers.  Pelvic exam and Pap test. This may be done every 3 years starting at age 28. Starting at age 74, this may be done every 5 years if you  have a Pap test in combination with an HPV test. Other tests  Sexually transmitted disease (STD) testing.  Bone density scan. This is done to screen for osteoporosis. You may have this scan if you are at high risk for osteoporosis. Follow these instructions at home: Eating and drinking  Eat a diet that includes fresh fruits and vegetables, whole grains, lean protein, and low-fat dairy.  Take vitamin and mineral supplements as recommended by your health care provider.  Do not drink alcohol if: ? Your health care provider tells you not to drink. ? You are pregnant, may be pregnant, or are planning to become pregnant.  If you drink alcohol: ? Limit how much you have to 0-1 drink a day. ? Be aware of how much alcohol is in your drink. In the [place] one drink equals one 12 oz bottle of beer (355 mL), one 5 oz glass of wine (148 mL), or one 1 oz glass of hard liquor (44 mL). Lifestyle  Take daily care of your teeth and gums.  Stay active. Exercise for at least 30 minutes on 5 or more days each week.  Do not use any products that contain nicotine or tobacco, such as cigarettes, e-cigarettes, and chewing tobacco. If you need help quitting, ask your health care provider.  If you are sexually active, practice safe sex. Use a condom or other form of birth control (contraception) in order to prevent pregnancy and STIs (sexually transmitted infections).  If told by your health care provider, take low-dose aspirin daily starting at age 80. What's next?  Visit your health care provider once a year for a well check visit.  Ask your health care provider how often you should have your eyes and teeth checked.  Stay up to date on all vaccines. This information is not intended to replace advice given to you by your health care provider. Make sure you discuss any questions you have with your health care provider. Document Released: 08/04/2015 Document Revised: 03/19/2018 Document Reviewed:  03/19/2018 Elsevier Patient Education  2020 [employer].

## 2019-07-05 NOTE — Progress Notes (Signed)
Patient presents to clinic today for annual exam.  Patient is fasting for labs.  Acute Concerns: Bilateral pressure in jaw, especially with biting.  Some creaking with range of motion of jaw.  Patient denies pain with chewing or swallowing. Symptoms are "there and gone".  Does occur multiple times throughout the day.  Denies ear pain, tooth pain or difficulty swallowing.   Patient endorses intermittent episodes of chest tightness and a sensation of burning described as " feel like I am breathing fire". Occasional dry cough at night. Denies abdominal pain, nausea/vomiting. Denies change to bowel habits. Denies blood in the stool. Denies tenesmus. Has history of GERD.  Is not taking anything for symptoms currently.  Denies racing heart, shortness of breath, lightheadedness or dizziness.  Patient has also noticed a lump on her left proximal biceps over the past several months.  Notes the area is soft on palpation.  Denies pain or pruritus.  Denies any noted change in this area since first noticed.  Denies similar lesion elsewhere.  Health Maintenance: Immunizations -- Td in 2017.Annual influenza up-to-date.  Mammogram -- UTD PAP -- UTD  Past Medical History:  Diagnosis Date  . History of chicken pox   . Venous thrombosis of leg    Left    Past Surgical History:  Procedure Laterality Date  . BREAST SURGERY     Fibroidadenoma-Left  . CHOLECYSTECTOMY    . REFRACTIVE SURGERY     Bilateral  . ruptured disc    . TONSILLECTOMY    . WISDOM TOOTH EXTRACTION      Current Outpatient Medications on File Prior to Visit  Medication Sig Dispense Refill  . CAMILA 0.35 MG tablet Take 1 tablet by mouth daily.  3  . clobetasol ointment (TEMOVATE) 0.05 %     . Crisaborole (EUCRISA) 2 % OINT Apply 1 application topically daily. 60 g 1  . hydrocortisone 2.5 % cream Apply 1 application topically 2 (two) times daily. 30 g 3  . aspirin 81 MG tablet Take 81 mg by mouth daily.     No current  facility-administered medications on file prior to visit.    Allergies  Allergen Reactions  . Amoxil [Amoxicillin] Hives  . Naproxen Rash    Family History  Problem Relation Age of Onset  . Brain cancer Mother 49       Deceased-Tumor  . Glaucoma Maternal Grandfather   . Glaucoma Maternal Aunt   . Cancer Maternal Grandmother   . Lung cancer Maternal Aunt   . Healthy Sister        x1    Social History   Socioeconomic History  . Marital status: Single    Spouse name: Not on file  . Number of children: Not on file  . Years of education: Not on file  . Highest education level: Not on file  Occupational History  . Not on file  Tobacco Use  . Smoking status: Never Smoker  . Smokeless tobacco: Never Used  Substance and Sexual Activity  . Alcohol use: Yes    Alcohol/week: 0.0 standard drinks    Comment: rare  . Drug use: No  . Sexual activity: Yes    Partners: Male    Birth control/protection: Pill    Comment: boyfriend  Other Topics Concern  . Not on file  Social History Narrative  . Not on file   Social Determinants of Health   Financial Resource Strain:   . Difficulty of Paying Living Expenses: Not on file  Food Insecurity:   . Worried About Charity fundraiser in the Last Year: Not on file  . Ran Out of Food in the Last Year: Not on file  Transportation Needs:   . Lack of Transportation (Medical): Not on file  . Lack of Transportation (Non-Medical): Not on file  Physical Activity:   . Days of Exercise per Week: Not on file  . Minutes of Exercise per Session: Not on file  Stress:   . Feeling of Stress : Not on file  Social Connections:   . Frequency of Communication with Friends and Family: Not on file  . Frequency of Social Gatherings with Friends and Family: Not on file  . Attends Religious Services: Not on file  . Active Member of Clubs or Organizations: Not on file  . Attends Archivist Meetings: Not on file  . Marital Status: Not on file    Intimate Partner Violence:   . Fear of Current or Ex-Partner: Not on file  . Emotionally Abused: Not on file  . Physically Abused: Not on file  . Sexually Abused: Not on file   Review of Systems  Constitutional: Negative for fever and weight loss.  HENT: Negative for ear discharge, ear pain, hearing loss and tinnitus.   Eyes: Negative for blurred vision, double vision, photophobia and pain.  Respiratory: Negative for cough and shortness of breath.   Cardiovascular: Negative for chest pain and palpitations.  Gastrointestinal: Negative for abdominal pain, blood in stool, constipation, diarrhea, heartburn, melena, nausea and vomiting.  Genitourinary: Negative for dysuria, flank pain, frequency, hematuria and urgency.  Musculoskeletal: Negative for falls.  Neurological: Negative for dizziness, loss of consciousness and headaches.  Endo/Heme/Allergies: Negative for environmental allergies.  Psychiatric/Behavioral: Negative for depression, hallucinations, substance abuse and suicidal ideas. The patient is not nervous/anxious and does not have insomnia.    BP 118/80   Pulse 72   Temp 98.2 F (36.8 C) (Temporal)   Resp 16   Ht '5\' 2"'$  (1.575 m)   Wt 222 lb (100.7 kg)   SpO2 98%   BMI 40.60 kg/m   Physical Exam Vitals reviewed.  HENT:     Head: Normocephalic and atraumatic.     Right Ear: Tympanic membrane, ear canal and external ear normal.     Left Ear: Tympanic membrane, ear canal and external ear normal.     Nose: Nose normal. No mucosal edema.     Mouth/Throat:     Pharynx: Uvula midline. No oropharyngeal exudate or posterior oropharyngeal erythema.  Eyes:     Conjunctiva/sclera: Conjunctivae normal.     Pupils: Pupils are equal, round, and reactive to light.  Neck:     Thyroid: No thyromegaly.  Cardiovascular:     Rate and Rhythm: Normal rate and regular rhythm.     Heart sounds: Normal heart sounds.  Pulmonary:     Effort: Pulmonary effort is normal. No respiratory  distress.     Breath sounds: Normal breath sounds. No wheezing or rales.  Chest:     Chest wall: No tenderness.  Abdominal:     General: Bowel sounds are normal. There is no distension.     Palpations: Abdomen is soft. There is no mass.     Tenderness: There is no abdominal tenderness. There is no guarding or rebound.  Musculoskeletal:     Cervical back: Neck supple. No tenderness.  Lymphadenopathy:     Cervical: No cervical adenopathy.  Skin:    General: Skin is warm and  dry.     Findings: No rash.       Neurological:     Mental Status: She is alert and oriented to person, place, and time.     Cranial Nerves: No cranial nerve deficit.    Recent Results (from the past 2160 hour(s))  [facility] MAMMOGRAPHY     Status: None   Collection Time: 04/07/19 12:00 AM  Result Value Ref Range   HM Mammogram 0-4 Bi-Rad 0-4 Bi-Rad, Self Reported Normal    Comment: 1 - NEGATIVE   Assessment/Plan: 1. Visit for preventive health examination Depression screen negative. Health Maintenance reviewed. Preventive schedule discussed and handout given in AVS. Will obtain fasting labs today.  - CBC w/Diff - Comp Met (CMET) - Hemoglobin A1c - Lipid Profile  2. TMJ click Start Topical Icy Hot to the areas. Recommend she consider follow-up with her oral surgeon regarding dental orthotics to help with pressure and occasional discomfort. Can consider imaging if not improving.   3. Gastroesophageal reflux disease without esophagitis Suspected based on her PMH and current issue. No alarm signs or symptoms. Start GERD diet and 2-week trial or Protonix 40 mg once daily. Follow-up in office or by video in 2 weeks.  - pantoprazole (PROTONIX) 40 MG tablet; Take 1 tablet (40 mg total) by mouth daily.  Dispense: 30 tablet; Refill: 3  4. Lipoma of left upper extremity Discussed benign etiology of lipoma. Does not want removed presently.    Leeanne Rio, PA-C

## 2019-07-06 ENCOUNTER — Encounter: Payer: Self-pay | Admitting: Family Medicine

## 2019-07-06 ENCOUNTER — Other Ambulatory Visit: Payer: Self-pay | Admitting: Emergency Medicine

## 2019-07-06 DIAGNOSIS — R7303 Prediabetes: Secondary | ICD-10-CM

## 2019-07-06 MED ORDER — CINNAMON 500 MG PO TABS: 1.0000 | ORAL_TABLET | Freq: Every day | ORAL | 3 refills | Status: DC

## 2019-07-06 NOTE — Progress Notes (Signed)
PCP: Brunetta Jeans, PA-C  Subjective:   HPI: Patient is a 48 y.o. female here for left foot pain.  Patient is making slow progress with her knees and they do feel better since last visit. She's been trying not to hit anything with right knee - not as tender in her sore sport Left knee does feel like it locks at times but is improving. Her left plantar fasciitis has flared in past month. Has done well with injection in past and would like to go ahead with this - feels like it lasts about a year. Wearing arch supports and not walking barefoot. Pain is 2/10 at worst and worse by end of day. No pain with rest and sitting. No skin changes, new injuries.  Past Medical History:  Diagnosis Date  . History of chicken pox   . Venous thrombosis of leg    Left    Current Outpatient Medications on File Prior to Visit  Medication Sig Dispense Refill  . aspirin 81 MG tablet Take 81 mg by mouth daily.    Marland Kitchen CAMILA 0.35 MG tablet Take 1 tablet by mouth daily.  3  . clobetasol ointment (TEMOVATE) 0.05 %     . Crisaborole (EUCRISA) 2 % OINT Apply 1 application topically daily. 60 g 1  . hydrocortisone 2.5 % cream Apply 1 application topically 2 (two) times daily. 30 g 3  . pantoprazole (PROTONIX) 40 MG tablet Take 1 tablet (40 mg total) by mouth daily. 30 tablet 3   No current facility-administered medications on file prior to visit.    Past Surgical History:  Procedure Laterality Date  . BREAST SURGERY     Fibroidadenoma-Left  . CHOLECYSTECTOMY    . REFRACTIVE SURGERY     Bilateral  . ruptured disc    . TONSILLECTOMY    . WISDOM TOOTH EXTRACTION      Allergies  Allergen Reactions  . Amoxil [Amoxicillin] Hives  . Naproxen Rash    Social History   Socioeconomic History  . Marital status: Single    Spouse name: Not on file  . Number of children: Not on file  . Years of education: Not on file  . Highest education level: Not on file  Occupational History  . Not on file   Tobacco Use  . Smoking status: Never Smoker  . Smokeless tobacco: Never Used  Substance and Sexual Activity  . Alcohol use: Yes    Alcohol/week: 0.0 standard drinks    Comment: rare  . Drug use: No  . Sexual activity: Yes    Partners: Male    Birth control/protection: Pill    Comment: boyfriend  Other Topics Concern  . Not on file  Social History Narrative  . Not on file   Social Determinants of Health   Financial Resource Strain:   . Difficulty of Paying Living Expenses: Not on file  Food Insecurity:   . Worried About Charity fundraiser in the Last Year: Not on file  . Ran Out of Food in the Last Year: Not on file  Transportation Needs:   . Lack of Transportation (Medical): Not on file  . Lack of Transportation (Non-Medical): Not on file  Physical Activity:   . Days of Exercise per Week: Not on file  . Minutes of Exercise per Session: Not on file  Stress:   . Feeling of Stress : Not on file  Social Connections:   . Frequency of Communication with Friends and Family: Not on file  .  Frequency of Social Gatherings with Friends and Family: Not on file  . Attends Religious Services: Not on file  . Active Member of Clubs or Organizations: Not on file  . Attends Archivist Meetings: Not on file  . Marital Status: Not on file  Intimate Partner Violence:   . Fear of Current or Ex-Partner: Not on file  . Emotionally Abused: Not on file  . Physically Abused: Not on file  . Sexually Abused: Not on file    Family History  Problem Relation Age of Onset  . Brain cancer Mother 71       Deceased-Tumor  . Glaucoma Maternal Grandfather   . Glaucoma Maternal Aunt   . Cancer Maternal Grandmother   . Lung cancer Maternal Aunt   . Healthy Sister        x1    BP 124/86   Ht 5\' 2"  (1.575 m)   Wt 215 lb (97.5 kg)   BMI 39.32 kg/m   Review of Systems: See HPI above.     Objective:  Physical Exam:  Gen: NAD, comfortable in exam room  Left foot/ankle: No  gross deformity, swelling, ecchymoses FROM with 5/5 strength all directions. TTP proximal plantar fascia including insertion medial calcaneus. Negative ant drawer and talar tilt.   Negative syndesmotic compression. Negative calcaneal squeeze. Thompsons test negative. NV intact distally.   Assessment & Plan:  1. Left plantar fasciitis - instructions given with encouraging arch supports as she has been, home exercises and stretches.  Icing, tylenol if needed.  Injection repeated today with ultrasound guidance.  F/u prn.  After informed written consent timeout was performed.  Patient was seated on exam table.  Area overlying proximal plantar fascia prepped medially with alcohol swab.  Then using ultrasound guidance, patient's left plantar fascia was injected with 2:1 bupivicaine: depomedrol.  Patient tolerated procedure well without immediate complications.

## 2019-07-09 ENCOUNTER — Encounter: Payer: Self-pay | Admitting: Physician Assistant

## 2019-07-21 ENCOUNTER — Other Ambulatory Visit: Payer: Self-pay

## 2019-07-21 ENCOUNTER — Ambulatory Visit (INDEPENDENT_AMBULATORY_CARE_PROVIDER_SITE_OTHER): Payer: BC Managed Care – PPO | Admitting: Physician Assistant

## 2019-07-21 ENCOUNTER — Encounter: Payer: Self-pay | Admitting: Physician Assistant

## 2019-07-21 ENCOUNTER — Ambulatory Visit: Payer: BC Managed Care – PPO | Admitting: Physician Assistant

## 2019-07-21 VITALS — BP 112/70 | HR 86 | Temp 98.6°F | Resp 16 | Ht 62.0 in | Wt 215.0 lb

## 2019-07-21 DIAGNOSIS — K219 Gastro-esophageal reflux disease without esophagitis: Secondary | ICD-10-CM

## 2019-07-21 DIAGNOSIS — R790 Abnormal level of blood mineral: Secondary | ICD-10-CM

## 2019-07-21 LAB — CALCIUM: Calcium: 8.8 mg/dL (ref 8.4–10.5)

## 2019-07-21 LAB — CBC WITH DIFFERENTIAL/PLATELET
Basophils Absolute: 0.1 10*3/uL (ref 0.0–0.1)
Basophils Relative: 1.2 % (ref 0.0–3.0)
Eosinophils Absolute: 0.2 10*3/uL (ref 0.0–0.7)
Eosinophils Relative: 3 % (ref 0.0–5.0)
HCT: 42.1 % (ref 36.0–46.0)
Hemoglobin: 14 g/dL (ref 12.0–15.0)
Lymphocytes Relative: 20.6 % (ref 12.0–46.0)
Lymphs Abs: 1.5 10*3/uL (ref 0.7–4.0)
MCHC: 33.2 g/dL (ref 30.0–36.0)
MCV: 88.9 fl (ref 78.0–100.0)
Monocytes Absolute: 0.6 10*3/uL (ref 0.1–1.0)
Monocytes Relative: 8.3 % (ref 3.0–12.0)
Neutro Abs: 4.8 10*3/uL (ref 1.4–7.7)
Neutrophils Relative %: 66.9 % (ref 43.0–77.0)
Platelets: 307 10*3/uL (ref 150.0–400.0)
RBC: 4.74 Mil/uL (ref 3.87–5.11)
RDW: 14.1 % (ref 11.5–15.5)
WBC: 7.2 10*3/uL (ref 4.0–10.5)

## 2019-07-21 MED ORDER — KETOCONAZOLE 2 % EX CREA: 1.0000 "application " | TOPICAL_CREAM | Freq: Every day | CUTANEOUS | 0 refills | Status: DC

## 2019-07-21 MED ORDER — HYDROCORTISONE 2.5 % EX CREA: 1.0000 "application " | TOPICAL_CREAM | Freq: Two times a day (BID) | CUTANEOUS | 3 refills | Status: DC | PRN

## 2019-07-21 MED ORDER — PANTOPRAZOLE SODIUM 40 MG PO TBEC: 40.0000 mg | DELAYED_RELEASE_TABLET | Freq: Every day | ORAL | 0 refills | Status: DC

## 2019-07-21 NOTE — Progress Notes (Signed)
Patient presents to clinic today for follow-up of GERD symptoms.  She is also in to recheck her calcium and platelet levels as these were both abnormal on last lab draw, but patient had noted donating blood the day before her labs.  At last visit, patient was started on pantoprazole once daily.  Patient endorses taking medication as directed and tolerating well.  Has noted complete resolution of symptoms.  Denies heartburn, cough, nausea, reflux.  Past Medical History:  Diagnosis Date  . History of chicken pox   . Venous thrombosis of leg    Left    Current Outpatient Medications on File Prior to Visit  Medication Sig Dispense Refill  . aspirin 81 MG tablet Take 81 mg by mouth daily.    Marland Kitchen CAMILA 0.35 MG tablet Take 1 tablet by mouth daily.  3  . Cinnamon 500 MG TABS Take 1 tablet (500 mg total) by mouth daily. 90 tablet 3  . clobetasol ointment (TEMOVATE) 0.05 %     . Crisaborole (EUCRISA) 2 % OINT Apply 1 application topically daily. 60 g 1  . pantoprazole (PROTONIX) 40 MG tablet Take 1 tablet (40 mg total) by mouth daily. 30 tablet 3   No current facility-administered medications on file prior to visit.    Allergies  Allergen Reactions  . Amoxil [Amoxicillin] Hives  . Naproxen Rash    Family History  Problem Relation Age of Onset  . Brain cancer Mother 7       Deceased-Tumor  . Glaucoma Maternal Grandfather   . Glaucoma Maternal Aunt   . Cancer Maternal Grandmother   . Lung cancer Maternal Aunt   . Healthy Sister        x1    Social History   Socioeconomic History  . Marital status: Single    Spouse name: Not on file  . Number of children: Not on file  . Years of education: Not on file  . Highest education level: Not on file  Occupational History  . Not on file  Tobacco Use  . Smoking status: Never Smoker  . Smokeless tobacco: Never Used  Substance and Sexual Activity  . Alcohol use: Yes    Alcohol/week: 0.0 standard drinks    Comment: rare  . Drug  use: No  . Sexual activity: Yes    Partners: Male    Birth control/protection: Pill    Comment: boyfriend  Other Topics Concern  . Not on file  Social History Narrative  . Not on file   Social Determinants of Health   Financial Resource Strain:   . Difficulty of Paying Living Expenses: Not on file  Food Insecurity:   . Worried About Charity fundraiser in the Last Year: Not on file  . Ran Out of Food in the Last Year: Not on file  Transportation Needs:   . Lack of Transportation (Medical): Not on file  . Lack of Transportation (Non-Medical): Not on file  Physical Activity:   . Days of Exercise per Week: Not on file  . Minutes of Exercise per Session: Not on file  Stress:   . Feeling of Stress : Not on file  Social Connections:   . Frequency of Communication with Friends and Family: Not on file  . Frequency of Social Gatherings with Friends and Family: Not on file  . Attends Religious Services: Not on file  . Active Member of Clubs or Organizations: Not on file  . Attends Archivist Meetings: Not on  file  . Marital Status: Not on file    Review of Systems - See HPI.  All other ROS are negative.  BP 112/70   Pulse 86   Temp 98.6 F (37 C) (Temporal)   Resp 16   Ht '5\' 2"'$  (1.575 m)   Wt 215 lb (97.5 kg)   SpO2 99%   BMI 39.32 kg/m   Physical Exam Vitals reviewed.  Constitutional:      Appearance: Normal appearance.  HENT:     Head: Normocephalic and atraumatic.  Pulmonary:     Effort: Pulmonary effort is normal.  Abdominal:     General: Bowel sounds are normal. There is no distension.     Palpations: Abdomen is soft. There is no mass.     Tenderness: There is no abdominal tenderness. There is no guarding or rebound.  Musculoskeletal:     Cervical back: Neck supple.  Neurological:     Mental Status: She is alert. Mental status is at baseline.  Psychiatric:        Mood and Affect: Mood normal.     Recent Results (from the past 2160 hour(s))    CBC w/Diff     Status: Abnormal   Collection Time: 07/05/19 10:21 AM  Result Value Ref Range   WBC 5.8 4.0 - 10.5 K/uL   RBC 4.94 3.87 - 5.11 Mil/uL   Hemoglobin 14.6 12.0 - 15.0 g/dL   HCT 43.7 36.0 - 46.0 %   MCV 88.5 78.0 - 100.0 fl   MCHC 33.3 30.0 - 36.0 g/dL   RDW 14.3 11.5 - 15.5 %   Platelets 104.0 (L) 150.0 - 400.0 K/uL   Neutrophils Relative % 65.6 43.0 - 77.0 %   Lymphocytes Relative 22.7 12.0 - 46.0 %   Monocytes Relative 8.0 3.0 - 12.0 %   Eosinophils Relative 3.2 0.0 - 5.0 %   Basophils Relative 0.5 0.0 - 3.0 %   Neutro Abs 3.8 1.4 - 7.7 K/uL   Lymphs Abs 1.3 0.7 - 4.0 K/uL   Monocytes Absolute 0.5 0.1 - 1.0 K/uL   Eosinophils Absolute 0.2 0.0 - 0.7 K/uL   Basophils Absolute 0.0 0.0 - 0.1 K/uL  Comp Met (CMET)     Status: Abnormal   Collection Time: 07/05/19 10:21 AM  Result Value Ref Range   Sodium 139 135 - 145 mEq/L   Potassium 4.1 3.5 - 5.1 mEq/L   Chloride 107 96 - 112 mEq/L   CO2 26 19 - 32 mEq/L   Glucose, Bld 104 (H) 70 - 99 mg/dL   BUN 14 6 - 23 mg/dL   Creatinine, Ser 0.68 0.40 - 1.20 mg/dL   Total Bilirubin 0.6 0.2 - 1.2 mg/dL   Alkaline Phosphatase 61 39 - 117 U/L   AST 21 0 - 37 U/L   ALT 26 0 - 35 U/L   Total Protein 5.7 (L) 6.0 - 8.3 g/dL   Albumin 3.6 3.5 - 5.2 g/dL   GFR 91.80 >60.00 mL/min   Calcium 8.3 (L) 8.4 - 10.5 mg/dL  Hemoglobin A1c     Status: None   Collection Time: 07/05/19 10:21 AM  Result Value Ref Range   Hgb A1c MFr Bld 6.0 4.6 - 6.5 %    Comment: Glycemic Control Guidelines for People with Diabetes:Non Diabetic:  <6%Goal of Therapy: <7%Additional Action Suggested:  >8%   Lipid Profile     Status: None   Collection Time: 07/05/19 10:21 AM  Result Value Ref Range  Cholesterol 150 0 - 200 mg/dL    Comment: ATP III Classification       Desirable:  < 200 mg/dL               Borderline High:  200 - 239 mg/dL          High:  > = 240 mg/dL   Triglycerides 121.0 0.0 - 149.0 mg/dL    Comment: Normal:  <150 mg/dLBorderline High:   150 - 199 mg/dL   HDL 46.20 >39.00 mg/dL   VLDL 24.2 0.0 - 40.0 mg/dL   LDL Cholesterol 79 0 - 99 mg/dL   Total CHOL/HDL Ratio 3     Comment:                Men          Women1/2 Average Risk     3.4          3.3Average Risk          5.0          4.42X Average Risk          9.6          7.13X Average Risk          15.0          11.0                       NonHDL 103.51     Comment: NOTE:  Non-HDL goal should be 30 mg/dL higher than patient's LDL goal (i.e. LDL goal of < 70 mg/dL, would have non-HDL goal of < 100 mg/dL)    Assessment/Plan: 1. Gastroesophageal reflux disease without esophagitis Resolution of symptoms with daily Protonix.  We will have her continue to follow GERD diet.  Decrease Protonix to every other day dosing for 2 weeks.  If symptoms remain resolved, will go to as needed use of Protonix.  She is aware that if she notes recurrence of symptoms when decreasing Protonix, she is to go back to daily dosing and give [place] a call. - pantoprazole (PROTONIX) 40 MG tablet; Take 1 tablet (40 mg total) by mouth daily.  Dispense: 90 tablet; Refill: 0  2. Calcium blood decreased Patient will hydrate today.  We will recheck calcium level and CBC. - CBC w/Diff - Calcium   Leeanne Rio, PA-C

## 2019-07-21 NOTE — Patient Instructions (Signed)
Please go to the lab today for blood work.  I will call you with your results. We will alter treatment regimen(s) if indicated by your results.   Please cut the Protonix down to every other day for 2 weeks. If still without symptoms, try a few days off of the medication.  If symptoms recur with cutting back medication, ok to restart daily.    Food Choices for Gastroesophageal Reflux Disease, Adult When you have gastroesophageal reflux disease (GERD), the foods you eat and your eating habits are very important. Choosing the right foods can help ease your discomfort. Think about working with a nutrition specialist (dietitian) to help you make good choices. What are tips for following this plan?  Meals  Choose healthy foods that are low in fat, such as fruits, vegetables, whole grains, low-fat dairy products, and lean meat, fish, and poultry.  Eat small meals often instead of 3 large meals a day. Eat your meals slowly, and in a place where you are relaxed. Avoid bending over or lying down until 2-3 hours after eating.  Avoid eating meals 2-3 hours before bed.  Avoid drinking a lot of liquid with meals.  Cook foods using methods other than frying. Bake, grill, or broil food instead.  Avoid or limit: ? Chocolate. ? Peppermint or spearmint. ? Alcohol. ? Pepper. ? Black and decaffeinated coffee. ? Black and decaffeinated tea. ? Bubbly (carbonated) soft drinks. ? Caffeinated energy drinks and soft drinks.  Limit high-fat foods such as: ? Fatty meat or fried foods. ? Whole milk, cream, butter, or ice cream. ? Nuts and nut butters. ? Pastries, donuts, and sweets made with butter or shortening.  Avoid foods that cause symptoms. These foods may be different for everyone. Common foods that cause symptoms include: ? Tomatoes. ? Oranges, lemons, and limes. ? Peppers. ? Spicy food. ? Onions and garlic. ? Vinegar. Lifestyle  Maintain a healthy weight. Ask your doctor what weight is  healthy for you. If you need to lose weight, work with your doctor to do so safely.  Exercise for at least 30 minutes for 5 or more days each week, or as told by your doctor.  Wear loose-fitting clothes.  Do not smoke. If you need help quitting, ask your doctor.  Sleep with the head of your bed higher than your feet. Use a wedge under the mattress or blocks under the bed frame to raise the head of the bed. Summary  When you have gastroesophageal reflux disease (GERD), food and lifestyle choices are very important in easing your symptoms.  Eat small meals often instead of 3 large meals a day. Eat your meals slowly, and in a place where you are relaxed.  Limit high-fat foods such as fatty meat or fried foods.  Avoid bending over or lying down until 2-3 hours after eating.  Avoid peppermint and spearmint, caffeine, alcohol, and chocolate. This information is not intended to replace advice given to you by your health care provider. Make sure you discuss any questions you have with your health care provider. Document Released: 01/07/2012 Document Revised: 10/29/2018 Document Reviewed: 08/13/2016 Elsevier Patient Education  2020 [employer].

## 2019-08-03 ENCOUNTER — Encounter (INDEPENDENT_AMBULATORY_CARE_PROVIDER_SITE_OTHER): Payer: Self-pay | Admitting: Family Medicine

## 2019-08-03 ENCOUNTER — Other Ambulatory Visit: Payer: Self-pay

## 2019-08-03 ENCOUNTER — Ambulatory Visit (INDEPENDENT_AMBULATORY_CARE_PROVIDER_SITE_OTHER): Payer: BC Managed Care – PPO | Admitting: Family Medicine

## 2019-08-03 VITALS — BP 121/82 | HR 69 | Temp 98.1°F | Ht 62.0 in | Wt 224.0 lb

## 2019-08-03 DIAGNOSIS — R5383 Other fatigue: Secondary | ICD-10-CM | POA: Diagnosis not present

## 2019-08-03 DIAGNOSIS — Z0289 Encounter for other administrative examinations: Secondary | ICD-10-CM

## 2019-08-03 DIAGNOSIS — R7303 Prediabetes: Secondary | ICD-10-CM

## 2019-08-03 DIAGNOSIS — F3289 Other specified depressive episodes: Secondary | ICD-10-CM | POA: Diagnosis not present

## 2019-08-03 DIAGNOSIS — R0602 Shortness of breath: Secondary | ICD-10-CM | POA: Diagnosis not present

## 2019-08-03 DIAGNOSIS — R0683 Snoring: Secondary | ICD-10-CM

## 2019-08-03 DIAGNOSIS — M722 Plantar fascial fibromatosis: Secondary | ICD-10-CM

## 2019-08-03 DIAGNOSIS — Z9189 Other specified personal risk factors, not elsewhere classified: Secondary | ICD-10-CM

## 2019-08-03 DIAGNOSIS — K219 Gastro-esophageal reflux disease without esophagitis: Secondary | ICD-10-CM

## 2019-08-03 DIAGNOSIS — Z6841 Body Mass Index (BMI) 40.0 and over, adult: Secondary | ICD-10-CM

## 2019-08-03 DIAGNOSIS — E559 Vitamin D deficiency, unspecified: Secondary | ICD-10-CM

## 2019-08-04 ENCOUNTER — Encounter (INDEPENDENT_AMBULATORY_CARE_PROVIDER_SITE_OTHER): Payer: Self-pay | Admitting: Family Medicine

## 2019-08-04 NOTE — Progress Notes (Signed)
Dear Donna Noble, PA-C,   Thank you for referring Donna Mendoza to our clinic. The following note includes my evaluation and treatment recommendations.  Chief Complaint:   OBESITY Donna Mendoza (MR# [id number]) is a 49 y.o. female who presents for evaluation and treatment of obesity and related comorbidities. Current BMI is Body mass index is 40.97 kg/m. Donna Mendoza has been struggling with her weight for many years and has been unsuccessful in either losing weight, maintaining weight loss, or reaching her healthy weight goal.  Donna Mendoza states she is currently in the action stage of change and ready to dedicate time achieving and maintaining a healthier weight. Donna Mendoza is interested in becoming our patient and working on intensive lifestyle modifications including (but not limited to) diet and exercise for weight loss.  Donna Mendoza's habits were reviewed today and are as follows: Her family eats meals together, she thinks her family will eat healthier with her, she struggles with family and or coworkers weight loss sabotage, her desired weight loss is 54 pounds, she has been heavy most of her life, she started gaining weight in the 6th grade, her heaviest weight ever was 224 pounds, she thinks she is somewhat of a picky eater, she craves chocolate and sweets, she skips breakfast and/or lunch several times per week, she is frequently drinking liquids with calories, she sometimes makes poor food choices and she struggles with emotional eating when stressed or bored.  Depression Screen Donna Mendoza's Food and Mood (modified PHQ-9) score was 6.  Depression screen PHQ 2/9 08/03/2019  Decreased Interest 1  Down, Depressed, Hopeless 1  PHQ - 2 Score 2  Altered sleeping 1  Tired, decreased energy 1  Change in appetite 1  Feeling bad or failure about yourself  1  Trouble concentrating 0  Moving slowly or fidgety/restless 0  Suicidal thoughts 0  PHQ-9 Score 6  Difficult doing work/chores  Not difficult at all   Subjective:   1. Other fatigue Donna Mendoza feels her energy is lower than it should be. This has worsened with weight gain and has worsened recently. Donna Mendoza denies daytime somnolence and reports waking up still tired. Patient is at risk for obstructive sleep apnea. Patient generally gets 6 or 7 hours of sleep per night, and states they generally have generally restful sleep. Snoring is present. Apneic episodes are not present. Epworth Sleepiness Score is 3.  Lab Results  Component Value Date   WBC 7.2 07/21/2019   HGB 14.0 07/21/2019   HCT 42.1 07/21/2019   MCV 88.9 07/21/2019   PLT 307.0 07/21/2019   Lab Results  Component Value Date   TSH 1.55 05/27/2018   Lab Results  Component Value Date   ALT 26 07/05/2019   AST 21 07/05/2019   ALKPHOS 61 07/05/2019   BILITOT 0.6 07/05/2019   BP Readings from Last 3 Encounters:  08/03/19 121/82  07/21/19 112/70  07/05/19 124/86   Lab Results  Component Value Date   HGBA1C 6.0 07/05/2019   2. SOB (shortness of breath) on exertion Donna Mendoza notes increasing shortness of breath with exercising and seems to be worsening over time with weight gain. She notes getting out of breath sooner with activity than she used to. This has gotten worse recently. Donna Mendoza denies shortness of breath at rest or orthopnea.  3. Prediabetes Donna Mendoza has a diagnosis of prediabetes based on her elevated HgA1c and was informed this puts her at greater risk of developing diabetes. She continues to work on diet and exercise to decrease  her risk of diabetes. She denies nausea or hypoglycemia.    Lab Results  Component Value Date   HGBA1C 6.0 07/05/2019   4. Gastroesophageal reflux disease without esophagitis The patient takes Protonix 40 mg daily for her GERD.   5. Vitamin D deficiency Vitamin D level was 21.14 on 05/27/2018. She is currently taking vit D 1000 IU daily. She denies nausea, vomiting or muscle weakness.  6. Snores Epworth  Sleepiness Score is 3.  7. Plantar fasciitis Donna Mendoza has plantar fasciitis in her left foot.  She wants to run a 5k.  8. Other depression with emotional eating  Donna Mendoza is struggling with emotional eating and using food for comfort to the extent that it is negatively impacting her health. She often snacks when she is stressed or bored. Donna Mendoza sometimes feels she is out of control and then feels guilty that she made poor food choices. She has been working on behavior modification techniques to help reduce her emotional eating and has been unsuccessful. She shows no sign of suicidal or homicidal ideations.  9. At risk for diabetes mellitus Donna Mendoza is at higher than average risk for developing diabetes due to her obesity and elevated A1c.  Assessment/Plan:   1. Other fatigue Donna Mendoza was informed fatigue may be related to obesity, depression or many other causes. Labs will be ordered, and in the meanwhile Donna Mendoza has agreed to work on diet, exercise and weight loss.  Orders - EKG 12-Lead  2. SOB (shortness of breath) on exertion Donna Mendoza's shortness of breath appears to be obesity related and exercise induced. She has agreed to work on weight loss and gradually increase exercise to treat her exercise induced shortness of breath. Will continue to monitor closely.  3. Prediabetes Donna Mendoza will continue to work on weight loss, exercise, and decreasing simple carbohydrates to help decrease the risk of diabetes.  Will consider metformin going forward.  4. Gastroesophageal reflux disease without esophagitis Intensive lifestyle modifications are the first line treatment for this issue. We discussed several lifestyle modifications today and she will continue to work on diet, exercise and weight loss efforts. Orders and follow up as documented in patient record.   Counseling . If a person has gastroesophageal reflux disease (GERD), food and stomach acid move back up into the esophagus and  cause symptoms or problems such as damage to the esophagus. . Anti-reflux measures include: raising the head of the bed, avoiding tight clothing or belts, avoiding eating late at night, not lying down shortly after mealtime, and achieving weight loss. . Avoid ASA, NSAID's, caffeine, alcohol, and tobacco.  . OTC Pepcid and/or Tums are often very helpful for as needed use.  Marland Kitchen However, for persisting chronic or daily symptoms, stronger medications like Omeprazole may be needed. . You may need to avoid foods and drinks such as: ? Coffee and tea (with or without caffeine). ? Drinks that contain alcohol. ? Energy drinks and sports drinks. ? Bubbly (carbonated) drinks or sodas. ? Chocolate and cocoa. ? Peppermint and mint flavorings. ? Garlic and onions. ? Horseradish. ? Spicy and acidic foods. These include peppers, chili powder, curry powder, vinegar, hot sauces, and BBQ sauce. ? Citrus fruit juices and citrus fruits, such as oranges, lemons, and limes. ? Tomato-based foods. These include red sauce, chili, salsa, and pizza with red sauce. ? Fried and fatty foods. These include donuts, french fries, potato chips, and high-fat dressings. ? High-fat meats. These include hot dogs, rib eye steak, sausage, ham, and bacon.  5. Vitamin  D deficiency Low Vitamin D level contributes to fatigue and are associated with obesity, breast, and colon cancer. She agrees to continue to take prescription Vitamin D @1000  IU every week and will follow-up for routine testing of vitamin D, at least 2-3 times per year to avoid over-replacement.  6. Snores Will monitor.  Consider sleep study if weight loss stalls.  7. Plantar fasciitis Will monitor with weight loss.  8. Other depression with emotional eating  Behavior modification techniques were discussed today to help Donna Mendoza deal with her emotional/non-hunger eating behaviors.  Orders and follow up as documented in patient record.   9. At risk for diabetes  mellitus Donna Mendoza was given approximately 15 minutes of diabetes education and counseling today. We discussed intensive lifestyle modifications today with an emphasis on weight loss as well as increasing exercise and decreasing simple carbohydrates in her diet. We also reviewed medication options with an emphasis on risk versus benefit of those discussed.   10. Class 3 severe obesity with serious comorbidity and body mass index (BMI) of 40.0 to 44.9 in adult, unspecified obesity type Donna Mendoza) [place] is currently in the action stage of change and her goal is to continue with weight loss efforts. I recommend Donna Mendoza begin the structured treatment plan as follows:  She has agreed to on the Category 2 Plan.  We discussed the following exercise goals today: For substantial health benefits, adults should do at least 150 minutes (2 hours and 30 minutes) a week of moderate-intensity, or 75 minutes (1 hour and 15 minutes) a week of vigorous-intensity aerobic physical activity, or an equivalent combination of moderate- and vigorous-intensity aerobic activity. Aerobic activity should be performed in episodes of at least 10 minutes, and preferably, it should be spread throughout the week. Adults should also include muscle-strengthening activities that involve all major muscle groups on 2 or more days a week.   We discussed the following behavioral modification strategies today: increasing lean protein intake, decreasing simple carbohydrates, increasing vegetables, increasing water intake, decreasing liquid calories and meal planning and cooking strategies.  She was informed of the importance of frequent follow-up visits to maximize her success with intensive lifestyle modifications for her multiple health conditions. She was informed we would discuss her lab results at her next visit unless there is a critical issue that needs to be addressed sooner. Donna Mendoza agreed to keep her next visit at the agreed upon time to  discuss these results.  Objective:   Blood pressure 121/82, pulse 69, temperature 98.1 F (36.7 C), temperature source Oral, height 5\' 2"  (1.575 m), weight 224 lb (101.6 kg), SpO2 98 %. Body mass index is 40.97 kg/m.  EKG: Normal sinus rhythm, rate 70 bpm.  Indirect Calorimeter completed today shows a VO2 of 256 and a REE of 1782.  Her calculated basal metabolic rate is 123456 thus her basal metabolic rate is better than expected.  General: Cooperative, alert, well developed, in no acute distress. HEENT: Conjunctivae and lids unremarkable. Neck: No thyromegaly.  Cardiovascular: Regular rhythm.  Lungs: Normal work of breathing. Extremities: No edema.  Neurologic: No focal deficits.   Lab Results  Component Value Date   CREATININE 0.68 07/05/2019   BUN 14 07/05/2019   NA 139 07/05/2019   K 4.1 07/05/2019   CL 107 07/05/2019   CO2 26 07/05/2019   Lab Results  Component Value Date   ALT 26 07/05/2019   AST 21 07/05/2019   ALKPHOS 61 07/05/2019   BILITOT 0.6 07/05/2019   Lab Results  Component Value Date   HGBA1C 6.0 07/05/2019   HGBA1C 5.9 05/27/2018   HGBA1C 5.6 04/02/2017   HGBA1C 5.7 06/03/2016   HGBA1C 5.6 02/14/2015   No results found for: INSULIN Lab Results  Component Value Date   TSH 1.55 05/27/2018   Lab Results  Component Value Date   CHOL 150 07/05/2019   HDL 46.20 07/05/2019   LDLCALC 79 07/05/2019   TRIG 121.0 07/05/2019   CHOLHDL 3 07/05/2019   Lab Results  Component Value Date   WBC 7.2 07/21/2019   HGB 14.0 07/21/2019   HCT 42.1 07/21/2019   MCV 88.9 07/21/2019   PLT 307.0 07/21/2019   Attestation Statements:   Reviewed by clinician on day of visit: allergies, medications, problem list, medical history, surgical history, family history, social history, and previous encounter notes.  Labs were drawn recently by PCP. They were reviewed with the patient.   I, Water quality scientist, CMA, am acting as Location manager for [employer], DO.  I have  reviewed the above documentation for accuracy and completeness, and I agree with the above. Briscoe Deutscher, DO

## 2019-08-04 NOTE — Telephone Encounter (Signed)
Please advise 

## 2019-08-09 ENCOUNTER — Other Ambulatory Visit: Payer: Self-pay

## 2019-08-09 ENCOUNTER — Encounter: Payer: Self-pay | Admitting: Physician Assistant

## 2019-08-09 ENCOUNTER — Ambulatory Visit (INDEPENDENT_AMBULATORY_CARE_PROVIDER_SITE_OTHER): Payer: BLUE CROSS/BLUE SHIELD | Admitting: Physician Assistant

## 2019-08-09 VITALS — BP 122/70 | HR 67 | Temp 97.1°F | Resp 16 | Ht 62.0 in | Wt 227.0 lb

## 2019-08-09 DIAGNOSIS — R0789 Other chest pain: Secondary | ICD-10-CM

## 2019-08-09 DIAGNOSIS — M26643 Arthritis of bilateral temporomandibular joint: Secondary | ICD-10-CM

## 2019-08-09 MED ORDER — IBUPROFEN 800 MG PO TABS: 800.0000 mg | ORAL_TABLET | Freq: Three times a day (TID) | ORAL | 0 refills | Status: DC | PRN

## 2019-08-09 NOTE — Progress Notes (Signed)
Patient presents to clinic today c/o intermittent episodes of right and left TMJ/jaw discomfort along with episodes of chest wall tenderness.  Patient denies trauma or injury.  Denies heavy lifting or overexertion.  Symptoms can happen at rest or with exertion and not alleviated with rest.  Notes pain seems to be migratory -sometimes sternal or clavicular.  At other times seems to be lower ribs.  Denies pleuritic pain.  Denies shortness of breath, palpitations, lightheadedness or dizziness.  Notes ongoing crepitus in her jaw bilaterally with chewing and range of motion.  Denies difficulty eating or swallowing.  No symptoms presently.  Past Medical History:  Diagnosis Date  . Gallbladder problem   . Heartburn   . History of chicken pox   . Venous thrombosis of leg    Left  . Vitamin D deficiency     Current Outpatient Medications on File Prior to Visit  Medication Sig Dispense Refill  . Ascorbic Acid (VITAMIN C) 1000 MG tablet Take 1,000 mg by mouth daily.    Marland Kitchen aspirin 81 MG tablet Take 81 mg by mouth daily.    Marland Kitchen aspirin-acetaminophen-caffeine (EXCEDRIN MIGRAINE) 250-250-65 MG tablet Take by mouth every 6 (six) hours as needed for headache.    . Calcium Carb-Cholecalciferol (CALCIUM 1000 + D PO) Take 1,000 mg by mouth.    Marland Kitchen CAMILA 0.35 MG tablet Take 1 tablet by mouth daily.  3  . Cholecalciferol (VITAMIN D3) 25 MCG (1000 UT) CAPS Take 1,000 Int'l Units by mouth.    . clobetasol ointment (TEMOVATE) 0.05 %     . Crisaborole (EUCRISA) 2 % OINT Apply 1 application topically daily. 60 g 1  . Glucosamine-Chondroit-Vit C-Mn (GLUCOSAMINE 1500 COMPLEX PO) Take 1,500 mg by mouth.    . hydrocortisone 2.5 % cream Apply 1 application topically 2 (two) times daily as needed. 30 g 3  . ketoconazole (NIZORAL) 2 % cream Apply 1 application topically daily. 15 g 0  . Turmeric Curcumin 500 MG CAPS Take 500 mg by mouth.    . pantoprazole (PROTONIX) 40 MG tablet Take 1 tablet (40 mg total) by mouth daily.  (Patient taking differently: Take 40 mg by mouth every other day. ) 90 tablet 0   No current facility-administered medications on file prior to visit.    Allergies  Allergen Reactions  . Amoxil [Amoxicillin] Hives  . Naproxen Rash    Family History  Problem Relation Age of Onset  . Brain cancer Mother 34       Deceased-Tumor  . Obesity Mother   . Glaucoma Maternal Grandfather   . Glaucoma Maternal Aunt   . Cancer Maternal Grandmother   . Lung cancer Maternal Aunt   . Healthy Sister        x1    Social History   Socioeconomic History  . Marital status: Single    Spouse name: Not on file  . Number of children: Not on file  . Years of education: Not on file  . Highest education level: Not on file  Occupational History  . Not on file  Tobacco Use  . Smoking status: Never Smoker  . Smokeless tobacco: Never Used  Substance and Sexual Activity  . Alcohol use: Yes    Alcohol/week: 0.0 standard drinks    Comment: rare  . Drug use: No  . Sexual activity: Yes    Partners: Male    Birth control/protection: Pill    Comment: boyfriend  Other Topics Concern  . Not on file  Social History Narrative  . Not on file   Social Determinants of Health   Financial Resource Strain:   . Difficulty of Paying Living Expenses: Not on file  Food Insecurity:   . Worried About Charity fundraiser in the Last Year: Not on file  . Ran Out of Food in the Last Year: Not on file  Transportation Needs:   . Lack of Transportation (Medical): Not on file  . Lack of Transportation (Non-Medical): Not on file  Physical Activity:   . Days of Exercise per Week: Not on file  . Minutes of Exercise per Session: Not on file  Stress:   . Feeling of Stress : Not on file  Social Connections:   . Frequency of Communication with Friends and Family: Not on file  . Frequency of Social Gatherings with Friends and Family: Not on file  . Attends Religious Services: Not on file  . Active Member of Clubs or  Organizations: Not on file  . Attends Archivist Meetings: Not on file  . Marital Status: Not on file   Review of Systems - See HPI.  All other ROS are negative.  BP 122/70   Pulse 67   Temp (!) 97.1 F (36.2 C) (Temporal)   Resp 16   Ht '5\' 2"'$  (1.575 m)   Wt 227 lb (103 kg)   SpO2 99%   BMI 41.52 kg/m   Physical Exam Vitals reviewed.  Constitutional:      Appearance: Normal appearance.  HENT:     Head: Normocephalic and atraumatic.     Right Ear: Tympanic membrane normal.     Left Ear: Tympanic membrane normal.     Mouth/Throat:     Mouth: Mucous membranes are moist.  Eyes:     Conjunctiva/sclera: Conjunctivae normal.     Pupils: Pupils are equal, round, and reactive to light.  Cardiovascular:     Rate and Rhythm: Normal rate and regular rhythm.     Pulses: Normal pulses.     Heart sounds: Normal heart sounds.  Pulmonary:     Effort: Pulmonary effort is normal.  Chest:     Chest wall: No tenderness.  Musculoskeletal:     Right shoulder: Normal.     Left shoulder: Normal.     Cervical back: Normal and neck supple.     Thoracic back: Normal.  Neurological:     Mental Status: She is alert.     Recent Results (from the past 2160 hour(s))  CBC w/Diff     Status: Abnormal   Collection Time: 07/05/19 10:21 AM  Result Value Ref Range   WBC 5.8 4.0 - 10.5 K/uL   RBC 4.94 3.87 - 5.11 Mil/uL   Hemoglobin 14.6 12.0 - 15.0 g/dL   HCT 43.7 36.0 - 46.0 %   MCV 88.5 78.0 - 100.0 fl   MCHC 33.3 30.0 - 36.0 g/dL   RDW 14.3 11.5 - 15.5 %   Platelets 104.0 (L) 150.0 - 400.0 K/uL   Neutrophils Relative % 65.6 43.0 - 77.0 %   Lymphocytes Relative 22.7 12.0 - 46.0 %   Monocytes Relative 8.0 3.0 - 12.0 %   Eosinophils Relative 3.2 0.0 - 5.0 %   Basophils Relative 0.5 0.0 - 3.0 %   Neutro Abs 3.8 1.4 - 7.7 K/uL   Lymphs Abs 1.3 0.7 - 4.0 K/uL   Monocytes Absolute 0.5 0.1 - 1.0 K/uL   Eosinophils Absolute 0.2 0.0 - 0.7 K/uL   Basophils  Absolute 0.0 0.0 - 0.1 K/uL    Comp Met (CMET)     Status: Abnormal   Collection Time: 07/05/19 10:21 AM  Result Value Ref Range   Sodium 139 135 - 145 mEq/L   Potassium 4.1 3.5 - 5.1 mEq/L   Chloride 107 96 - 112 mEq/L   CO2 26 19 - 32 mEq/L   Glucose, Bld 104 (H) 70 - 99 mg/dL   BUN 14 6 - 23 mg/dL   Creatinine, Ser 0.68 0.40 - 1.20 mg/dL   Total Bilirubin 0.6 0.2 - 1.2 mg/dL   Alkaline Phosphatase 61 39 - 117 U/L   AST 21 0 - 37 U/L   ALT 26 0 - 35 U/L   Total Protein 5.7 (L) 6.0 - 8.3 g/dL   Albumin 3.6 3.5 - 5.2 g/dL   GFR 91.80 >60.00 mL/min   Calcium 8.3 (L) 8.4 - 10.5 mg/dL  Hemoglobin A1c     Status: None   Collection Time: 07/05/19 10:21 AM  Result Value Ref Range   Hgb A1c MFr Bld 6.0 4.6 - 6.5 %    Comment: Glycemic Control Guidelines for People with Diabetes:Non Diabetic:  <6%Goal of Therapy: <7%Additional Action Suggested:  >8%   Lipid Profile     Status: None   Collection Time: 07/05/19 10:21 AM  Result Value Ref Range   Cholesterol 150 0 - 200 mg/dL    Comment: ATP III Classification       Desirable:  < 200 mg/dL               Borderline High:  200 - 239 mg/dL          High:  > = 240 mg/dL   Triglycerides 121.0 0.0 - 149.0 mg/dL    Comment: Normal:  <150 mg/dLBorderline High:  150 - 199 mg/dL   HDL 46.20 >39.00 mg/dL   VLDL 24.2 0.0 - 40.0 mg/dL   LDL Cholesterol 79 0 - 99 mg/dL   Total CHOL/HDL Ratio 3     Comment:                Men          Women1/2 Average Risk     3.4          3.3Average Risk          5.0          4.42X Average Risk          9.6          7.13X Average Risk          15.0          11.0                       NonHDL 103.51     Comment: NOTE:  Non-HDL goal should be 30 mg/dL higher than patient's LDL goal (i.e. LDL goal of < 70 mg/dL, would have non-HDL goal of < 100 mg/dL)  CBC w/Diff     Status: None   Collection Time: 07/21/19  8:41 AM  Result Value Ref Range   WBC 7.2 4.0 - 10.5 K/uL   RBC 4.74 3.87 - 5.11 Mil/uL   Hemoglobin 14.0 12.0 - 15.0 g/dL   HCT 42.1 36.0 -  46.0 %   MCV 88.9 78.0 - 100.0 fl   MCHC 33.2 30.0 - 36.0 g/dL   RDW 14.1 11.5 - 15.5 %   Platelets 307.0 150.0 - 400.0  K/uL   Neutrophils Relative % 66.9 43.0 - 77.0 %   Lymphocytes Relative 20.6 12.0 - 46.0 %   Monocytes Relative 8.3 3.0 - 12.0 %   Eosinophils Relative 3.0 0.0 - 5.0 %   Basophils Relative 1.2 0.0 - 3.0 %   Neutro Abs 4.8 1.4 - 7.7 K/uL   Lymphs Abs 1.5 0.7 - 4.0 K/uL   Monocytes Absolute 0.6 0.1 - 1.0 K/uL   Eosinophils Absolute 0.2 0.0 - 0.7 K/uL   Basophils Absolute 0.1 0.0 - 0.1 K/uL  Calcium     Status: None   Collection Time: 07/21/19  8:41 AM  Result Value Ref Range   Calcium 8.8 8.4 - 10.5 mg/dL    Assessment/Plan: 1. Arthritis of both temporomandibular joints Crepitus and clicking noted with range of motion of Tylenol today.  Intermittent discomfort in TMJ joints.  Recommend nightguard.  Supportive measures and OTC medications reviewed.  If ongoing issue will refer to oral surgeon.  2. Chest wall pain Examination unremarkable today.  Symptoms intermittent and do not seem cardiopulmonary in nature.  Reassurance given.  Suspect potential costochondritis.  Start short course of meloxicam once daily with food.  Supportive measures reviewed.  If not improving/resolving will need further work-up.  This visit occurred during the SARS-CoV-2 public health emergency.  Safety protocols were in place, including screening questions prior to the visit, additional usage of staff PPE, and extensive cleaning of exam room while observing appropriate contact time as indicated for disinfecting solutions.      Leeanne Rio, PA-C

## 2019-08-09 NOTE — Patient Instructions (Signed)
Please avoid heavy lifting or overexertion.  Take the Ibuprofen twice daily over the next week with food. Make sure to take your Pantoprazole daily over the next week. Get a night guard to wear in your mouth to help prevent any grinding of teeth.   If not improving let me know and I will have a specialist take a look at things further.

## 2019-08-11 ENCOUNTER — Ambulatory Visit: Payer: Self-pay | Admitting: Physician Assistant

## 2019-08-17 ENCOUNTER — Other Ambulatory Visit: Payer: Self-pay

## 2019-08-17 ENCOUNTER — Encounter (INDEPENDENT_AMBULATORY_CARE_PROVIDER_SITE_OTHER): Payer: Self-pay | Admitting: Family Medicine

## 2019-08-17 ENCOUNTER — Ambulatory Visit (INDEPENDENT_AMBULATORY_CARE_PROVIDER_SITE_OTHER): Payer: BC Managed Care – PPO | Admitting: Family Medicine

## 2019-08-17 VITALS — BP 120/81 | HR 63 | Temp 98.0°F | Ht 62.0 in | Wt 222.0 lb

## 2019-08-17 DIAGNOSIS — Z6841 Body Mass Index (BMI) 40.0 and over, adult: Secondary | ICD-10-CM

## 2019-08-17 DIAGNOSIS — F3289 Other specified depressive episodes: Secondary | ICD-10-CM

## 2019-08-17 DIAGNOSIS — R7303 Prediabetes: Secondary | ICD-10-CM

## 2019-08-17 DIAGNOSIS — M722 Plantar fascial fibromatosis: Secondary | ICD-10-CM

## 2019-08-17 DIAGNOSIS — E559 Vitamin D deficiency, unspecified: Secondary | ICD-10-CM

## 2019-08-17 DIAGNOSIS — Z9189 Other specified personal risk factors, not elsewhere classified: Secondary | ICD-10-CM

## 2019-08-17 MED ORDER — METFORMIN HCL 500 MG PO TABS: 500.0000 mg | ORAL_TABLET | Freq: Every day | ORAL | 0 refills | Status: DC

## 2019-08-18 ENCOUNTER — Encounter (INDEPENDENT_AMBULATORY_CARE_PROVIDER_SITE_OTHER): Payer: Self-pay | Admitting: Family Medicine

## 2019-08-18 NOTE — Progress Notes (Signed)
Chief Complaint:   OBESITY Donna Mendoza is here to discuss her progress with her obesity treatment plan along with follow-up of her obesity related diagnoses. Donna Mendoza is on the Category 2 Plan and states she is following her eating plan approximately 95% of the time. Donna Mendoza states she is exercising 0 minutes 0 times per week.  Today's visit was #: 2 Starting weight: 224 lbs Starting date: 08/03/2019 Today's weight: 222 lbs Today's date: 08/17/2019 Total lbs lost to date: 2 Total lbs lost since last in-office visit: 2  Interim History: Donna Mendoza is down 2 lbs; up 3 lbs in water. She did well with diet adherence and logged all of her food.  Subjective:   Prediabetes. Donna Mendoza has a diagnosis of prediabetes based on her elevated HgA1c and was informed this puts her at greater risk of developing diabetes. She continues to work on diet and exercise to decrease her risk of diabetes. She denies nausea or hypoglycemia.  Lab Results  Component Value Date   HGBA1C 6.0 07/05/2019   No results found for: INSULIN  Vitamin D deficiency. Donna Mendoza had a Vitamin D level of 21.14 on 05/27/2018. She is on Vitamin D 1,000 IU daily.  Plantar fasciitis, Right foot. Rosanne states her goal is to run a 5K again.  Other depression, with emotional eating. Donna Mendoza is struggling with emotional eating and using food for comfort to the extent that it is negatively impacting her health. She has been working on behavior modification techniques to help reduce her emotional eating and has been somewhat successful. She shows no sign of suicidal or homicidal ideations.  At risk for diabetes mellitus. Donna Mendoza is at higher than average risk for developing diabetes due to her obesity.   Assessment/Plan:   Prediabetes. Khushi will continue to work on weight loss, exercise, and decreasing simple carbohydrates to help decrease the risk of diabetes. She was given a prescription for metFORMIN (GLUCOPHAGE)  500 MG tablet PO daily #30.  Vitamin D deficiency. Low Vitamin D level contributes to fatigue and are associated with obesity, breast, and colon cancer. She agrees to continue to take Vitamin D @ 1,000 IU daily and will follow-up for routine testing of Vitamin D, at least 2-3 times per year to avoid over-replacement.  Plantar fasciitis, Right foot. Will monitor.  Other depression, with emotional eating. Behavior modification techniques were discussed today to help [name] deal with her emotional/non-hunger eating behaviors.  Orders and follow up as documented in patient record. [name] is at higher than average risk for developing diabetes due to her obesity.   At risk for diabetes mellitus. Donna Mendoza was given approximately 15 minutes of diabetes education and counseling today. We discussed intensive lifestyle modifications today with an emphasis on weight loss as well as increasing exercise and decreasing simple carbohydrates in her diet. We also reviewed medication options with an emphasis on risk versus benefit of those discussed.   Repetitive spaced learning was employed today to elicit superior memory formation and behavioral change.  Class 3 severe obesity with serious comorbidity and body mass index (BMI) of 40.0 to 44.9 in adult, unspecified obesity type ([place]).  Donna Mendoza is currently in the action stage of change. As such, her goal is to continue with weight loss efforts. She has agreed to the Category 2 Plan.   Exercise goals: For substantial health benefits, adults should do at least 150 minutes (2 hours and 30 minutes) a week of moderate-intensity, or 75 minutes (1 hour and 15 minutes) a week  of vigorous-intensity aerobic physical activity, or an equivalent combination of moderate- and vigorous-intensity aerobic activity. Aerobic activity should be performed in episodes of at least 10 minutes, and preferably, it should be spread throughout the week. Adults should also include  muscle-strengthening activities that involve all major muscle groups on 2 or more days a week.  Behavioral modification strategies: increasing vegetables and planning for success.  Donna Mendoza has agreed to follow-up with our clinic in 2 weeks. She was informed of the importance of frequent follow-up visits to maximize her success with intensive lifestyle modifications for her multiple health conditions.   Objective:   Blood pressure 120/81, pulse 63, temperature 98 F (36.7 C), temperature source Oral, height 5\' 2"  (1.575 m), weight 222 lb (100.7 kg), SpO2 100 %. Body mass index is 40.6 kg/m.  General: Cooperative, alert, well developed, in no acute distress. HEENT: Conjunctivae and lids unremarkable. Cardiovascular: Regular rhythm.  Lungs: Normal work of breathing. Neurologic: No focal deficits.   Lab Results  Component Value Date   CREATININE 0.68 07/05/2019   BUN 14 07/05/2019   NA 139 07/05/2019   K 4.1 07/05/2019   CL 107 07/05/2019   CO2 26 07/05/2019   Lab Results  Component Value Date   ALT 26 07/05/2019   AST 21 07/05/2019   ALKPHOS 61 07/05/2019   BILITOT 0.6 07/05/2019   Lab Results  Component Value Date   HGBA1C 6.0 07/05/2019   HGBA1C 5.9 05/27/2018   HGBA1C 5.6 04/02/2017   HGBA1C 5.7 06/03/2016   HGBA1C 5.6 02/14/2015   No results found for: INSULIN Lab Results  Component Value Date   TSH 1.55 05/27/2018   Lab Results  Component Value Date   CHOL 150 07/05/2019   HDL 46.20 07/05/2019   LDLCALC 79 07/05/2019   TRIG 121.0 07/05/2019   CHOLHDL 3 07/05/2019   Lab Results  Component Value Date   WBC 7.2 07/21/2019   HGB 14.0 07/21/2019   HCT 42.1 07/21/2019   MCV 88.9 07/21/2019   PLT 307.0 07/21/2019   No results found for: IRON, TIBC, FERRITIN  Attestation Statements:   Reviewed by clinician on day of visit: allergies, medications, problem list, medical history, surgical history, family history, social history, and previous encounter  notes.  I performed a medically necessary appropriate examination and/or evaluation. I discussed the assessment and treatment plan with the patient. The patient was provided an opportunity to ask questions and all were answered. The patient agreed with the plan and demonstrated an understanding of the instructions. Clinical information was updated and documented in the EMR. Time spent on visit including pre-visit chart review and post-visit care was 45 minutes. A separate 15 minutes was spent on risk counseling (see above).  IMichaelene Song, am acting as Location manager for [employer]. Juleen China, DO   I have reviewed the above documentation for accuracy and completeness, and I agree with the above. Briscoe Deutscher, DO

## 2019-08-18 NOTE — Telephone Encounter (Signed)
Please advise 

## 2019-09-01 ENCOUNTER — Ambulatory Visit (INDEPENDENT_AMBULATORY_CARE_PROVIDER_SITE_OTHER): Payer: BC Managed Care – PPO | Admitting: Family Medicine

## 2019-09-01 ENCOUNTER — Encounter (INDEPENDENT_AMBULATORY_CARE_PROVIDER_SITE_OTHER): Payer: Self-pay | Admitting: Family Medicine

## 2019-09-01 ENCOUNTER — Other Ambulatory Visit: Payer: Self-pay

## 2019-09-01 VITALS — BP 115/82 | HR 84 | Temp 98.2°F | Ht 62.0 in | Wt 219.0 lb

## 2019-09-01 DIAGNOSIS — E559 Vitamin D deficiency, unspecified: Secondary | ICD-10-CM

## 2019-09-01 DIAGNOSIS — Z9189 Other specified personal risk factors, not elsewhere classified: Secondary | ICD-10-CM | POA: Diagnosis not present

## 2019-09-01 DIAGNOSIS — R7303 Prediabetes: Secondary | ICD-10-CM | POA: Diagnosis not present

## 2019-09-01 DIAGNOSIS — F3289 Other specified depressive episodes: Secondary | ICD-10-CM

## 2019-09-01 DIAGNOSIS — Z6841 Body Mass Index (BMI) 40.0 and over, adult: Secondary | ICD-10-CM

## 2019-09-01 MED ORDER — METFORMIN HCL 500 MG PO TABS: 500.0000 mg | ORAL_TABLET | Freq: Every day | ORAL | 0 refills | Status: DC

## 2019-09-01 NOTE — Progress Notes (Signed)
Chief Complaint:   OBESITY Donna Mendoza is here to discuss her progress with her obesity treatment plan along with follow-up of her obesity related diagnoses. Donna Mendoza is on the Category 2 Plan and states she is following her eating plan approximately 98% of the time. Donna Mendoza states she is walking for 15-20 minutes 2-3 times per week.  Today's visit was #: 3 Starting weight: 224 lbs Starting date: 08/03/2019 Today's weight: 219 lbs Today's date: 09/01/2019 Total lbs lost to date: 5 lbs Total lbs lost since last in-office visit: 3 lbs  Interim History: Donna Mendoza finds that one protein shake a day is helpful.  She liked the [employer]  Subjective:   1. Prediabetes Donna Mendoza has a diagnosis of prediabetes based on her elevated HgA1c and was informed this puts her at greater risk of developing diabetes. She continues to work on diet and exercise to decrease her risk of diabetes. She denies nausea or hypoglycemia.  She is taking metformin 500 mg daily.  Lab Results  Component Value Date   HGBA1C 6.0 07/05/2019   2. Vitamin D deficiency Donna Mendoza's Vitamin D level was 21.14 on 05/27/2018. She is currently taking vit D 1000 IU daily. She denies nausea, vomiting or muscle weakness.  3. Other depression, with emotional eating  Donna Mendoza is struggling with emotional eating and using food for comfort to the extent that it is negatively impacting her health. She has been working on behavior modification techniques to help reduce her emotional eating and has been unsuccessful. She shows no sign of suicidal or homicidal ideations.  4. At risk for diabetes mellitus Donna Mendoza was given approximately 15 minutes of diabetes education and counseling today. We discussed intensive lifestyle modifications today with an emphasis on weight loss as well as increasing exercise and decreasing simple carbohydrates in her diet. We also reviewed medication options with an emphasis on risk versus benefit of those  discussed.   Repetitive spaced learning was employed today to elicit superior memory formation and behavioral change.  Assessment/Plan:   1. Prediabetes Donna Mendoza will continue to work on weight loss, exercise, and decreasing simple carbohydrates to help decrease the risk of diabetes.   Orders - metFORMIN (GLUCOPHAGE) 500 MG tablet; Take 1 tablet (500 mg total) by mouth daily with breakfast.  Dispense: 90 tablet; Refill: 0  2. Vitamin D deficiency Low Vitamin D level contributes to fatigue and are associated with obesity, breast, and colon cancer. She agrees to continue to take Vitamin D @1 ,000 IU daily and will follow-up for routine testing of Vitamin D, at least 2-3 times per year to avoid over-replacement.  3. Other depression, with emotional eating  Behavior modification techniques were discussed today to help Donna Mendoza deal with her emotional/non-hunger eating behaviors.  Orders and follow up as documented in patient record.   4. At risk for diabetes mellitus Donna Mendoza was given approximately 15 minutes of diabetes education and counseling today. We discussed intensive lifestyle modifications today with an emphasis on weight loss as well as increasing exercise and decreasing simple carbohydrates in her diet. We also reviewed medication options with an emphasis on risk versus benefit of those discussed.   Repetitive spaced learning was employed today to elicit superior memory formation and behavioral change.  5. Class 3 severe obesity with serious comorbidity and body mass index (BMI) of 40.0 to 44.9 in adult, unspecified obesity type Donna Mendoza) Donna Mendoza is currently in the action stage of change. As such, her goal is to continue with weight loss efforts. She has  agreed to the Category 2 Plan.   Exercise goals: As is.  Behavioral modification strategies: increasing lean protein intake, increasing water intake and meal planning and cooking strategies.  Donna Mendoza has agreed to follow-up with  our clinic in 2 weeks. She was informed of the importance of frequent follow-up visits to maximize her success with intensive lifestyle modifications for her multiple health conditions.   Objective:   Blood pressure 115/82, pulse 84, temperature 98.2 F (36.8 C), temperature source Oral, height 5\' 2"  (1.575 m), weight 219 lb (99.3 kg), SpO2 97 %. Body mass index is 40.06 kg/m.  General: Cooperative, alert, well developed, in no acute distress. HEENT: Conjunctivae and lids unremarkable. Cardiovascular: Regular rhythm.  Lungs: Normal work of breathing. Neurologic: No focal deficits.   Lab Results  Component Value Date   CREATININE 0.68 07/05/2019   BUN 14 07/05/2019   NA 139 07/05/2019   K 4.1 07/05/2019   CL 107 07/05/2019   CO2 26 07/05/2019   Lab Results  Component Value Date   ALT 26 07/05/2019   AST 21 07/05/2019   ALKPHOS 61 07/05/2019   BILITOT 0.6 07/05/2019   Lab Results  Component Value Date   HGBA1C 6.0 07/05/2019   HGBA1C 5.9 05/27/2018   HGBA1C 5.6 04/02/2017   HGBA1C 5.7 06/03/2016   HGBA1C 5.6 02/14/2015   No results found for: INSULIN Lab Results  Component Value Date   TSH 1.55 05/27/2018   Lab Results  Component Value Date   CHOL 150 07/05/2019   HDL 46.20 07/05/2019   LDLCALC 79 07/05/2019   TRIG 121.0 07/05/2019   CHOLHDL 3 07/05/2019   Lab Results  Component Value Date   WBC 7.2 07/21/2019   HGB 14.0 07/21/2019   HCT 42.1 07/21/2019   MCV 88.9 07/21/2019   PLT 307.0 07/21/2019   Attestation Statements:   Reviewed by clinician on day of visit: allergies, medications, problem list, medical history, surgical history, family history, social history, and previous encounter notes.  I, Water quality scientist, CMA, am acting as Location manager for [employer], DO.  I have reviewed the above documentation for accuracy and completeness, and I agree with the above. Briscoe Deutscher, DO

## 2019-09-13 ENCOUNTER — Ambulatory Visit (INDEPENDENT_AMBULATORY_CARE_PROVIDER_SITE_OTHER): Payer: BC Managed Care – PPO | Admitting: Family Medicine

## 2019-09-13 ENCOUNTER — Other Ambulatory Visit: Payer: Self-pay

## 2019-09-13 ENCOUNTER — Encounter (INDEPENDENT_AMBULATORY_CARE_PROVIDER_SITE_OTHER): Payer: Self-pay | Admitting: Family Medicine

## 2019-09-13 VITALS — BP 111/66 | HR 76 | Temp 98.2°F | Ht 62.0 in | Wt 215.0 lb

## 2019-09-13 DIAGNOSIS — F3289 Other specified depressive episodes: Secondary | ICD-10-CM

## 2019-09-13 DIAGNOSIS — Z9189 Other specified personal risk factors, not elsewhere classified: Secondary | ICD-10-CM | POA: Diagnosis not present

## 2019-09-13 DIAGNOSIS — E559 Vitamin D deficiency, unspecified: Secondary | ICD-10-CM

## 2019-09-13 DIAGNOSIS — R7303 Prediabetes: Secondary | ICD-10-CM

## 2019-09-13 DIAGNOSIS — K219 Gastro-esophageal reflux disease without esophagitis: Secondary | ICD-10-CM | POA: Diagnosis not present

## 2019-09-13 DIAGNOSIS — Z6839 Body mass index (BMI) 39.0-39.9, adult: Secondary | ICD-10-CM

## 2019-09-13 MED ORDER — PANTOPRAZOLE SODIUM 40 MG PO TBEC: 40.0000 mg | DELAYED_RELEASE_TABLET | ORAL | 0 refills | Status: DC

## 2019-09-13 NOTE — Progress Notes (Signed)
Chief Complaint:   OBESITY Donna Mendoza is here to discuss her progress with her obesity treatment plan along with follow-up of her obesity related diagnoses. Donna Mendoza is on the Category 2 Plan and states she is following her eating plan approximately 90% of the time. Donna Mendoza states she is walking the dog for 15-20 minutes 2-3 times per week.  Today's visit was #: 4 Starting weight: 224 lbs Starting date: 08/03/2019 Today's weight: 215 lbs Today's date: 09/13/2019 Total lbs lost to date: 9 lbs Total lbs lost since last in-office visit: 4 lbs  Interim History: Mallery's mother-in-law died.  The funteral is next week in [place].  She has been logging all food.  Subjective:   1. Vitamin D deficiency Donna Mendoza's Vitamin D level was 21.14 on 05/27/2018. She is currently taking vit D. She denies nausea, vomiting or muscle weakness.  2. Prediabetes Donna Mendoza has a diagnosis of prediabetes based on her elevated HgA1c and was informed this puts her at greater risk of developing diabetes. She continues to work on diet and exercise to decrease her risk of diabetes. She denies nausea or hypoglycemia. She is taking metformin 500 mg daily.  Lab Results  Component Value Date   HGBA1C 6.0 07/05/2019   3. Gastroesophageal reflux disease without esophagitis She has been experiencing acid reflux.  Taking Protonix.  4. Other depression, with emotional eating  Donna Mendoza is struggling with emotional eating and using food for comfort to the extent that it is negatively impacting her health. She has been working on behavior modification techniques to help reduce her emotional eating and has been unsuccessful. She shows no sign of suicidal or homicidal ideations.  5. At risk for diabetes mellitus Donna Mendoza is at higher than average risk for developing diabetes due to her obesity.   Assessment/Plan:   1. Vitamin D deficiency Low Vitamin D level contributes to fatigue and are associated with obesity,  breast, and colon cancer. She agrees to continue to take Vitamin D @1 ,000 IU every day and will follow-up for routine testing of Vitamin D, at least 2-3 times per year to avoid over-replacement.  2. Prediabetes Donna Mendoza will continue to work on weight loss, exercise, and decreasing simple carbohydrates to help decrease the risk of diabetes.   3. Gastroesophageal reflux disease without esophagitis Intensive lifestyle modifications are the first line treatment for this issue. We discussed several lifestyle modifications today and she will continue to work on diet, exercise and weight loss efforts. Orders and follow up as documented in patient record.   Counseling . If a person has gastroesophageal reflux disease (GERD), food and stomach acid move back up into the esophagus and cause symptoms or problems such as damage to the esophagus. . Anti-reflux measures include: raising the head of the bed, avoiding tight clothing or belts, avoiding eating late at night, not lying down shortly after mealtime, and achieving weight loss. . Avoid ASA, NSAID's, caffeine, alcohol, and tobacco.  . OTC Pepcid and/or Tums are often very helpful for as needed use.  Marland Kitchen However, for persisting chronic or daily symptoms, stronger medications like Omeprazole may be needed. . You may need to avoid foods and drinks such as: ? Coffee and tea (with or without caffeine). ? Drinks that contain alcohol. ? Energy drinks and sports drinks. ? Bubbly (carbonated) drinks or sodas. ? Chocolate and cocoa. ? Peppermint and mint flavorings. ? Garlic and onions. ? Horseradish. ? Spicy and acidic foods. These include peppers, chili powder, curry powder, vinegar, hot sauces, and  BBQ sauce. ? Citrus fruit juices and citrus fruits, such as oranges, lemons, and limes. ? Tomato-based foods. These include red sauce, chili, salsa, and pizza with red sauce. ? Fried and fatty foods. These include donuts, french fries, potato chips, and high-fat  dressings. ? High-fat meats. These include hot dogs, rib eye steak, sausage, ham, and bacon.  Orders - pantoprazole (PROTONIX) 40 MG tablet; Take 1 tablet (40 mg total) by mouth every other day.  Dispense: 90 tablet; Refill: 0  4. Other depression, with emotional eating  Good blood sugar control is important to decrease the likelihood of diabetic complications such as nephropathy, neuropathy, limb loss, blindness, coronary artery disease, and death. Intensive lifestyle modification including diet, exercise and weight loss are the first line of treatment for diabetes.   5. At risk for diabetes mellitus Donna Mendoza was given approximately 15 minutes of diabetes education and counseling today. We discussed intensive lifestyle modifications today with an emphasis on weight loss as well as increasing exercise and decreasing simple carbohydrates in her diet. We also reviewed medication options with an emphasis on risk versus benefit of those discussed.   Repetitive spaced learning was employed today to elicit superior memory formation and behavioral change.  6. Class 2 severe obesity with serious comorbidity and body mass index (BMI) of 39.0 to 39.9 in adult, unspecified obesity type Donna Mendoza) Donna Mendoza is currently in the action stage of change. As such, her goal is to continue with weight loss efforts. She has agreed to the Category 2 Plan.   Exercise goals: No exercise has been prescribed at this time. For substantial health benefits, adults should do at least 150 minutes (2 hours and 30 minutes) a week of moderate-intensity, or 75 minutes (1 hour and 15 minutes) a week of vigorous-intensity aerobic physical activity, or an equivalent combination of moderate- and vigorous-intensity aerobic activity. Aerobic activity should be performed in episodes of at least 10 minutes, and preferably, it should be spread throughout the week.  Behavioral modification strategies: increasing lean protein intake and increasing  water intake.  Donna Mendoza has agreed to follow-up with our clinic in 2 weeks. She was informed of the importance of frequent follow-up visits to maximize her success with intensive lifestyle modifications for her multiple health conditions.   Objective:   Blood pressure 111/66, pulse 76, temperature 98.2 F (36.8 C), temperature source Oral, height 5\' 2"  (1.575 m), weight 215 lb (97.5 kg), SpO2 99 %. Body mass index is 39.32 kg/m.  General: Cooperative, alert, well developed, in no acute distress. HEENT: Conjunctivae and lids unremarkable. Cardiovascular: Regular rhythm.  Lungs: Normal work of breathing. Neurologic: No focal deficits.   Lab Results  Component Value Date   CREATININE 0.68 07/05/2019   BUN 14 07/05/2019   NA 139 07/05/2019   K 4.1 07/05/2019   CL 107 07/05/2019   CO2 26 07/05/2019   Lab Results  Component Value Date   ALT 26 07/05/2019   AST 21 07/05/2019   ALKPHOS 61 07/05/2019   BILITOT 0.6 07/05/2019   Lab Results  Component Value Date   HGBA1C 6.0 07/05/2019   HGBA1C 5.9 05/27/2018   HGBA1C 5.6 04/02/2017   HGBA1C 5.7 06/03/2016   HGBA1C 5.6 02/14/2015   No results found for: INSULIN Lab Results  Component Value Date   TSH 1.55 05/27/2018   Lab Results  Component Value Date   CHOL 150 07/05/2019   HDL 46.20 07/05/2019   LDLCALC 79 07/05/2019   TRIG 121.0 07/05/2019   CHOLHDL  3 07/05/2019   Lab Results  Component Value Date   WBC 7.2 07/21/2019   HGB 14.0 07/21/2019   HCT 42.1 07/21/2019   MCV 88.9 07/21/2019   PLT 307.0 07/21/2019   Attestation Statements:   Reviewed by clinician on day of visit: allergies, medications, problem list, medical history, surgical history, family history, social history, and previous encounter notes.  I, Water quality scientist, CMA, am acting as Location manager for [employer], DO.  I have reviewed the above documentation for accuracy and completeness, and I agree with the above. Briscoe Deutscher, DO

## 2019-09-15 ENCOUNTER — Ambulatory Visit (INDEPENDENT_AMBULATORY_CARE_PROVIDER_SITE_OTHER): Payer: BC Managed Care – PPO | Admitting: Family Medicine

## 2019-09-29 ENCOUNTER — Ambulatory Visit (INDEPENDENT_AMBULATORY_CARE_PROVIDER_SITE_OTHER): Payer: BC Managed Care – PPO | Admitting: Family Medicine

## 2019-09-29 ENCOUNTER — Other Ambulatory Visit: Payer: Self-pay

## 2019-09-29 ENCOUNTER — Encounter (INDEPENDENT_AMBULATORY_CARE_PROVIDER_SITE_OTHER): Payer: Self-pay | Admitting: Family Medicine

## 2019-09-29 VITALS — BP 115/84 | HR 80 | Temp 98.3°F | Ht 62.0 in | Wt 214.0 lb

## 2019-09-29 DIAGNOSIS — E559 Vitamin D deficiency, unspecified: Secondary | ICD-10-CM

## 2019-09-29 DIAGNOSIS — Z9189 Other specified personal risk factors, not elsewhere classified: Secondary | ICD-10-CM

## 2019-09-29 DIAGNOSIS — R5383 Other fatigue: Secondary | ICD-10-CM

## 2019-09-29 DIAGNOSIS — Z6839 Body mass index (BMI) 39.0-39.9, adult: Secondary | ICD-10-CM

## 2019-09-29 DIAGNOSIS — R7303 Prediabetes: Secondary | ICD-10-CM | POA: Diagnosis not present

## 2019-09-29 DIAGNOSIS — R1013 Epigastric pain: Secondary | ICD-10-CM

## 2019-09-29 NOTE — Progress Notes (Signed)
Chief Complaint:   OBESITY Donna Mendoza is here to discuss her progress with her obesity treatment plan along with follow-up of her obesity related diagnoses. Donna Mendoza is on the Category 2 Plan and states she is following her eating plan approximately 90% of the time. Donna Mendoza states she is walking for 25 minutes 2-3 times per week.  Today's visit was #: 5 Starting weight: 224 lbs Starting date: 08/03/2019 Today's weight: 214 lbs Today's date: 09/29/2019 Total lbs lost to date: 10 lbs  Total lbs lost since last in-office visit: 1 lb  Interim History: Donna Mendoza says she has been more fatigued lately.  She recently went to her Mother-in-Law's funeral.  She has tried low carb pizza.  She says it was "okay", but it wasn't great.    Subjective:   1. Prediabetes [place] has a diagnosis of prediabetes based on her elevated HgA1c and was informed this puts her at greater risk of developing diabetes. She continues to work on diet and exercise to decrease her risk of diabetes. She denies nausea or hypoglycemia.  She is taking metformin 500 mg daily.  Lab Results  Component Value Date   HGBA1C 6.0 07/05/2019   2. Vitamin D deficiency Donna Mendoza's Vitamin D level was 21.14 on 05/27/2018. She is currently taking vit D. She denies nausea, vomiting or muscle weakness.  3. Dyspepsia Donna Mendoza has been taking Protonix for her symptoms of dyspepsia.  4. Other fatigue Donna Mendoza reports and increase of fatigue recently.  Assessment/Plan:   1. Prediabetes [name] will continue to work on weight loss, exercise, and decreasing simple carbohydrates to help decrease the risk of diabetes.   2. Vitamin D deficiency Low Vitamin D level contributes to fatigue and are associated with obesity, breast, and colon cancer. She agrees to continue to take prescription Vitamin D @50 ,000 IU every week and will follow-up for routine testing of Vitamin D, at least 2-3 times per year to avoid over-replacement.  3.  Dyspepsia Current treatment plan is effective, no change in therapy. We will continue to monitor. Orders and follow up as documented in patient record.  4. Other fatigue Counseling: Intensive lifestyle modifications are the first line treatment for this issue. We discussed several lifestyle modifications today and she will continue to work on diet, exercise and weight loss efforts. We will continue to monitor. Orders and follow up as documented in patient record.  5. At risk for diabetes mellitus Donna Mendoza was given approximately 15 minutes of diabetes education and counseling today. We discussed intensive lifestyle modifications today with an emphasis on weight loss as well as increasing exercise and decreasing simple carbohydrates in her diet. We also reviewed medication options with an emphasis on risk versus benefit of those discussed.   Repetitive spaced learning was employed today to elicit superior memory formation and behavioral change.  6. Class 2 severe obesity with serious comorbidity and body mass index (BMI) of 39.0 to 39.9 in adult, unspecified obesity type [name]) Donna Mendoza is currently in the action stage of change. As such, her goal is to continue with weight loss efforts. She has agreed to the Category 2 Plan.   Exercise goals: As is.  Behavioral modification strategies: celebration eating strategies.  Donna Mendoza has agreed to follow-up with our clinic in 2 weeks. She was informed of the importance of frequent follow-up visits to maximize her success with intensive lifestyle modifications for her multiple health conditions.   Donna Mendoza was informed we would discuss her lab results at her next visit unless there  is a critical issue that needs to be addressed sooner. Donna Mendoza agreed to keep her next visit at the agreed upon time to discuss these results.  Objective:   Blood pressure 115/84, pulse 80, temperature 98.3 F (36.8 C), temperature source Oral, height 5\' 2"  (1.575 m),  weight 214 lb (97.1 kg), SpO2 98 %. Body mass index is 39.14 kg/m.  General: Cooperative, alert, well developed, in no acute distress. HEENT: Conjunctivae and lids unremarkable. Cardiovascular: Regular rhythm.  Lungs: Normal work of breathing. Neurologic: No focal deficits.   Lab Results  Component Value Date   CREATININE 0.68 07/05/2019   BUN 14 07/05/2019   NA 139 07/05/2019   K 4.1 07/05/2019   CL 107 07/05/2019   CO2 26 07/05/2019   Lab Results  Component Value Date   ALT 26 07/05/2019   AST 21 07/05/2019   ALKPHOS 61 07/05/2019   BILITOT 0.6 07/05/2019   Lab Results  Component Value Date   HGBA1C 6.0 07/05/2019   HGBA1C 5.9 05/27/2018   HGBA1C 5.6 04/02/2017   HGBA1C 5.7 06/03/2016   HGBA1C 5.6 02/14/2015   Lab Results  Component Value Date   TSH 1.55 05/27/2018   Lab Results  Component Value Date   CHOL 150 07/05/2019   HDL 46.20 07/05/2019   LDLCALC 79 07/05/2019   TRIG 121.0 07/05/2019   CHOLHDL 3 07/05/2019   Lab Results  Component Value Date   WBC 7.2 07/21/2019   HGB 14.0 07/21/2019   HCT 42.1 07/21/2019   MCV 88.9 07/21/2019   PLT 307.0 07/21/2019   Attestation Statements:   Reviewed by clinician on day of visit: allergies, medications, problem list, medical history, surgical history, family history, social history, and previous encounter notes.  I, Water quality scientist, CMA, am acting as Location manager for [employer], DO.  I have reviewed the above documentation for accuracy and completeness, and I agree with the above. Briscoe Deutscher, DO

## 2019-09-30 LAB — CBC WITH DIFFERENTIAL/PLATELET
Basophils Absolute: 0 10*3/uL (ref 0.0–0.2)
Basos: 0 %
EOS (ABSOLUTE): 0.2 10*3/uL (ref 0.0–0.4)
Eos: 3 %
Hematocrit: 45.6 % (ref 34.0–46.6)
Hemoglobin: 14.5 g/dL (ref 11.1–15.9)
Immature Grans (Abs): 0 10*3/uL (ref 0.0–0.1)
Immature Granulocytes: 0 %
Lymphocytes Absolute: 1.5 10*3/uL (ref 0.7–3.1)
Lymphs: 20 %
MCH: 29.5 pg (ref 26.6–33.0)
MCHC: 31.8 g/dL (ref 31.5–35.7)
MCV: 93 fL (ref 79–97)
Monocytes Absolute: 0.6 10*3/uL (ref 0.1–0.9)
Monocytes: 8 %
Neutrophils Absolute: 5.3 10*3/uL (ref 1.4–7.0)
Neutrophils: 69 %
Platelets: 321 10*3/uL (ref 150–450)
RBC: 4.91 x10E6/uL (ref 3.77–5.28)
RDW: 13.1 % (ref 11.7–15.4)
WBC: 7.7 10*3/uL (ref 3.4–10.8)

## 2019-09-30 LAB — COMPREHENSIVE METABOLIC PANEL
ALT: 24 IU/L (ref 0–32)
AST: 24 IU/L (ref 0–40)
Albumin/Globulin Ratio: 1.8 (ref 1.2–2.2)
Albumin: 4.2 g/dL (ref 3.8–4.8)
Alkaline Phosphatase: 80 IU/L (ref 39–117)
BUN/Creatinine Ratio: 24 — ABNORMAL HIGH (ref 9–23)
BUN: 16 mg/dL (ref 6–24)
Bilirubin Total: 0.3 mg/dL (ref 0.0–1.2)
CO2: 23 mmol/L (ref 20–29)
Calcium: 9.4 mg/dL (ref 8.7–10.2)
Chloride: 107 mmol/L — ABNORMAL HIGH (ref 96–106)
Creatinine, Ser: 0.68 mg/dL (ref 0.57–1.00)
GFR calc Af Amer: 119 mL/min/{1.73_m2} (ref >59)
GFR calc non Af Amer: 103 mL/min/{1.73_m2} (ref >59)
Globulin, Total: 2.3 g/dL (ref 1.5–4.5)
Glucose: 100 mg/dL — ABNORMAL HIGH (ref 65–99)
Potassium: 4.5 mmol/L (ref 3.5–5.2)
Sodium: 144 mmol/L (ref 134–144)
Total Protein: 6.5 g/dL (ref 6.0–8.5)

## 2019-09-30 LAB — VITAMIN B12: Vitamin B-12: 558 pg/mL (ref 232–1245)

## 2019-09-30 LAB — VITAMIN D 25 HYDROXY (VIT D DEFICIENCY, FRACTURES): Vit D, 25-Hydroxy: 51.8 ng/mL (ref 30.0–100.0)

## 2019-10-08 ENCOUNTER — Ambulatory Visit: Payer: BC Managed Care – PPO | Attending: Internal Medicine

## 2019-10-08 DIAGNOSIS — Z23 Encounter for immunization: Secondary | ICD-10-CM

## 2019-10-08 NOTE — Progress Notes (Signed)
   Covid-19 Vaccination Clinic  Name:  Jaquala Whoolery    MRN: [id number] DOB: 06-07-1971  10/08/2019  Ms. Laforgia was observed post Covid-19 immunization for 15 minutes without incident. She was provided with Vaccine Information Sheet and instruction to access the V-Safe system.   Ms. Folks was instructed to call 911 with any severe reactions post vaccine: Marland Kitchen Difficulty breathing  . Swelling of face and throat  . A fast heartbeat  . A bad rash all over body  . Dizziness and weakness   Immunizations Administered    Name Date Dose VIS Date Route   Pfizer COVID-19 Vaccine 10/08/2019  3:17 PM 0.3 mL 07/02/2019 Intramuscular   Manufacturer: [place]   Lot: [id number]   [place]: [id number]

## 2019-10-18 ENCOUNTER — Encounter (INDEPENDENT_AMBULATORY_CARE_PROVIDER_SITE_OTHER): Payer: Self-pay | Admitting: Family Medicine

## 2019-10-18 ENCOUNTER — Other Ambulatory Visit: Payer: Self-pay

## 2019-10-18 ENCOUNTER — Ambulatory Visit (INDEPENDENT_AMBULATORY_CARE_PROVIDER_SITE_OTHER): Payer: BC Managed Care – PPO | Admitting: Family Medicine

## 2019-10-18 VITALS — BP 117/82 | HR 68 | Temp 98.3°F | Ht 62.0 in | Wt 213.0 lb

## 2019-10-18 DIAGNOSIS — Z9189 Other specified personal risk factors, not elsewhere classified: Secondary | ICD-10-CM

## 2019-10-18 DIAGNOSIS — E559 Vitamin D deficiency, unspecified: Secondary | ICD-10-CM | POA: Diagnosis not present

## 2019-10-18 DIAGNOSIS — R7303 Prediabetes: Secondary | ICD-10-CM

## 2019-10-18 DIAGNOSIS — Z6839 Body mass index (BMI) 39.0-39.9, adult: Secondary | ICD-10-CM

## 2019-10-18 DIAGNOSIS — F3289 Other specified depressive episodes: Secondary | ICD-10-CM

## 2019-10-19 LAB — HEMOGLOBIN A1C
Est. average glucose Bld gHb Est-mCnc: 117 mg/dL
Hgb A1c MFr Bld: 5.7 % — ABNORMAL HIGH (ref 4.8–5.6)

## 2019-10-19 NOTE — Progress Notes (Signed)
Chief Complaint:   OBESITY Donna Mendoza is here to discuss her progress with her obesity treatment plan along with follow-up of her obesity related diagnoses. Donna Mendoza is on the Category 2 Plan and states she is following her eating plan approximately 80% of the time. Donna Mendoza states she is walking 2.5 to 3 miles 3 times per week.  Today's visit was #: 6 Starting weight: 224 lbs Starting date: 08/03/2019 Today's weight: 213 lbs Today's date: 10/18/2019 Total lbs lost to date: 11 lbs Total lbs lost since last in-office visit: 1 lb  Interim History: Donna Mendoza says she has had a rough 2 weeks.  Her father-in-law is in SNF/rehab.  She has had to do lots of running around for him.  Subjective:   1. Prediabetes Donna Mendoza has a diagnosis of prediabetes based on her elevated HgA1c and was informed this puts her at greater risk of developing diabetes. She continues to work on diet and exercise to decrease her risk of diabetes. She denies nausea or hypoglycemia.  She is taking metformin 500 mg daily.  Lab Results  Component Value Date   HGBA1C 6.0 07/05/2019   2. Vitamin D deficiency Donna Mendoza's Vitamin D level was 51.8 on 09/29/2019. She is currently taking prescription vitamin D 50,000 IU each week. She denies nausea, vomiting or muscle weakness.  Vitamin D level is now at goal.  3. Other depression, with emotional eating  Donna Mendoza is struggling with emotional eating and using food for comfort to the extent that it is negatively impacting her health. She has been working on behavior modification techniques to help reduce her emotional eating and has been unsuccessful. She shows no sign of suicidal or homicidal ideations.  Assessment/Plan:   1. Prediabetes Donna Mendoza will continue to work on weight loss, exercise, and decreasing simple carbohydrates to help decrease the risk of diabetes.   Orders - Hemoglobin A1c  2. Vitamin D deficiency Low Vitamin D level contributes to fatigue and are  associated with obesity, breast, and colon cancer. She agrees to start Vitamin D @1000  IU daily and will follow-up for routine testing of Vitamin D, at least 2-3 times per year to avoid over-replacement.  3. Other depression, with emotional eating  Behavior modification techniques were discussed today to help Donna Mendoza deal with her emotional/non-hunger eating behaviors.  Orders and follow up as documented in patient record.   4. At risk for diabetes mellitus Donna Mendoza was given approximately 15 minutes of diabetes education and counseling today. We discussed intensive lifestyle modifications today with an emphasis on weight loss as well as increasing exercise and decreasing simple carbohydrates in her diet. We also reviewed medication options with an emphasis on risk versus benefit of those discussed.   Repetitive spaced learning was employed today to elicit superior memory formation and behavioral change.  5. Class 2 severe obesity with serious comorbidity and body mass index (BMI) of 39.0 to 39.9 in adult, unspecified obesity type [facility]) Donna Mendoza is currently in the action stage of change. As such, her goal is to continue with weight loss efforts. She has agreed to the Category 2 Plan.   Exercise goals: For substantial health benefits, adults should do at least 150 minutes (2 hours and 30 minutes) a week of moderate-intensity, or 75 minutes (1 hour and 15 minutes) a week of vigorous-intensity aerobic physical activity, or an equivalent combination of moderate- and vigorous-intensity aerobic activity. Aerobic activity should be performed in episodes of at least 10 minutes, and preferably, it should be spread throughout  the week.  Behavioral modification strategies: increasing lean protein intake.  Donna Mendoza has agreed to follow-up with our clinic in 2 weeks. She was informed of the importance of frequent follow-up visits to maximize her success with intensive lifestyle modifications for her multiple  health conditions.   Donna Mendoza was informed we would discuss her lab results at her next visit unless there is a critical issue that needs to be addressed sooner. Donna Mendoza agreed to keep her next visit at the agreed upon time to discuss these results.  Objective:   Blood pressure 117/82, pulse 68, temperature 98.3 F (36.8 C), temperature source Oral, height 5\' 2"  (1.575 m), weight 213 lb (96.6 kg), SpO2 98 %. Body mass index is 38.96 kg/m.  General: Cooperative, alert, well developed, in no acute distress. HEENT: Conjunctivae and lids unremarkable. Cardiovascular: Regular rhythm.  Lungs: Normal work of breathing. Neurologic: No focal deficits.   Lab Results  Component Value Date   CREATININE 0.68 09/29/2019   BUN 16 09/29/2019   NA 144 09/29/2019   K 4.5 09/29/2019   CL 107 (H) 09/29/2019   CO2 23 09/29/2019   Lab Results  Component Value Date   ALT 24 09/29/2019   AST 24 09/29/2019   ALKPHOS 80 09/29/2019   BILITOT 0.3 09/29/2019   Lab Results  Component Value Date   HGBA1C 6.0 07/05/2019   HGBA1C 5.9 05/27/2018   HGBA1C 5.6 04/02/2017   HGBA1C 5.7 06/03/2016   HGBA1C 5.6 02/14/2015   Lab Results  Component Value Date   TSH 1.55 05/27/2018   Lab Results  Component Value Date   CHOL 150 07/05/2019   HDL 46.20 07/05/2019   LDLCALC 79 07/05/2019   TRIG 121.0 07/05/2019   CHOLHDL 3 07/05/2019   Lab Results  Component Value Date   WBC 7.7 09/29/2019   HGB 14.5 09/29/2019   HCT 45.6 09/29/2019   MCV 93 09/29/2019   PLT 321 09/29/2019   Attestation Statements:   Reviewed by clinician on day of visit: allergies, medications, problem list, medical history, surgical history, family history, social history, and previous encounter notes.  I, Water quality scientist, CMA, am acting as Location manager for [employer], DO.  I have reviewed the above documentation for accuracy and completeness, and I agree with the above. Briscoe Deutscher, DO

## 2019-11-03 ENCOUNTER — Ambulatory Visit: Payer: BC Managed Care – PPO | Attending: Internal Medicine

## 2019-11-03 DIAGNOSIS — Z23 Encounter for immunization: Secondary | ICD-10-CM

## 2019-11-03 NOTE — Progress Notes (Signed)
   Covid-19 Vaccination Clinic  Name:  Donna Mendoza    MRN: [id number] DOB: 06-06-1971  11/03/2019  Donna Mendoza was observed post Covid-19 immunization for 15 minutes without incident. She was provided with Vaccine Information Sheet and instruction to access the V-Safe system.   Donna Mendoza was instructed to call 911 with any severe reactions post vaccine: Marland Kitchen Difficulty breathing  . Swelling of face and throat  . A fast heartbeat  . A bad rash all over body  . Dizziness and weakness   Immunizations Administered    Name Date Dose VIS Date Route   Pfizer COVID-19 Vaccine 11/03/2019  3:30 PM 0.3 mL 07/02/2019 Intramuscular   Manufacturer: [place]   Lot: [id number]   [place]: [id number]

## 2019-11-08 ENCOUNTER — Ambulatory Visit (INDEPENDENT_AMBULATORY_CARE_PROVIDER_SITE_OTHER): Payer: BC Managed Care – PPO | Admitting: Family Medicine

## 2019-11-08 ENCOUNTER — Encounter (INDEPENDENT_AMBULATORY_CARE_PROVIDER_SITE_OTHER): Payer: Self-pay | Admitting: Family Medicine

## 2019-11-08 ENCOUNTER — Other Ambulatory Visit: Payer: Self-pay

## 2019-11-08 VITALS — BP 114/60 | HR 77 | Temp 98.7°F | Ht 62.0 in | Wt 209.0 lb

## 2019-11-08 DIAGNOSIS — R7303 Prediabetes: Secondary | ICD-10-CM

## 2019-11-08 DIAGNOSIS — F3289 Other specified depressive episodes: Secondary | ICD-10-CM

## 2019-11-08 DIAGNOSIS — Z6838 Body mass index (BMI) 38.0-38.9, adult: Secondary | ICD-10-CM | POA: Diagnosis not present

## 2019-11-08 MED ORDER — METFORMIN HCL 500 MG PO TABS: 500.0000 mg | ORAL_TABLET | Freq: Two times a day (BID) | ORAL | 0 refills | Status: DC

## 2019-11-11 NOTE — Progress Notes (Signed)
Chief Complaint:   OBESITY Donna Mendoza is here to discuss her progress with her obesity treatment plan along with follow-up of her obesity related diagnoses. Donna Mendoza is on the Category 2 Plan and states she is following her eating plan approximately 90% of the time. Donna Mendoza states she is walking for 2.5 miles 3-4 times per week.  Today's visit was #: 7 Starting weight: 224 lbs Starting date: 08/03/2019 Today's weight: 209 lbs Today's date: 11/08/2019 Total lbs lost to date: 15 lbs Total lbs lost since last in-office visit: 4 lbs  Interim History: Donna Mendoza is down 15 pounds.  She will be going to [place], May 15th-22nd.  Subjective:   1. Prediabetes Donna Mendoza has a diagnosis of prediabetes based on her elevated HgA1c and was informed this puts her at greater risk of developing diabetes. She continues to work on diet and exercise to decrease her risk of diabetes. She denies nausea or hypoglycemia.  She is taking metformin 500 mg twice daily.  Lab Results  Component Value Date   HGBA1C 5.7 (H) 10/18/2019   2. Other depression, with emotional eating  Donna Mendoza is struggling with emotional eating and using food for comfort to the extent that it is negatively impacting her health. She has been working on behavior modification techniques to help reduce her emotional eating and has been successful. She shows no sign of suicidal or homicidal ideations.  Assessment/Plan:   1. Prediabetes Donna Mendoza will continue to work on weight loss, exercise, and decreasing simple carbohydrates to help decrease the risk of diabetes.   Orders - metFORMIN (GLUCOPHAGE) 500 MG tablet; Take 1 tablet (500 mg total) by mouth 2 (two) times daily with a meal.  Dispense: 60 tablet; Refill: 0  2. Other depression, with emotional eating  Behavior modification techniques were discussed today to help Donna Mendoza deal with her emotional/non-hunger eating behaviors.  Orders and follow up as documented in patient record.     3. Class 2 severe obesity with serious comorbidity and body mass index (BMI) of 38.0 to 38.9 in adult, unspecified obesity type [facility]) Donna Mendoza is currently in the action stage of change. As such, her goal is to continue with weight loss efforts. She has agreed to the Category 2 Plan.   Exercise goals: For substantial health benefits, adults should do at least 150 minutes (2 hours and 30 minutes) a week of moderate-intensity, or 75 minutes (1 hour and 15 minutes) a week of vigorous-intensity aerobic physical activity, or an equivalent combination of moderate- and vigorous-intensity aerobic activity. Aerobic activity should be performed in episodes of at least 10 minutes, and preferably, it should be spread throughout the week.  Behavioral modification strategies: increasing lean protein intake and increasing water intake.  Donna Mendoza has agreed to follow-up with our clinic in 2 weeks. She was informed of the importance of frequent follow-up visits to maximize her success with intensive lifestyle modifications for her multiple health conditions.   Objective:   Blood pressure 114/60, pulse 77, temperature 98.7 F (37.1 C), temperature source Oral, height 5\' 2"  (1.575 m), weight 209 lb (94.8 kg), SpO2 97 %. Body mass index is 38.23 kg/m.  General: Cooperative, alert, well developed, in no acute distress. HEENT: Conjunctivae and lids unremarkable. Cardiovascular: Regular rhythm.  Lungs: Normal work of breathing. Neurologic: No focal deficits.   Lab Results  Component Value Date   CREATININE 0.68 09/29/2019   BUN 16 09/29/2019   NA 144 09/29/2019   K 4.5 09/29/2019   CL 107 (H)  09/29/2019   CO2 23 09/29/2019   Lab Results  Component Value Date   ALT 24 09/29/2019   AST 24 09/29/2019   ALKPHOS 80 09/29/2019   BILITOT 0.3 09/29/2019   Lab Results  Component Value Date   HGBA1C 5.7 (H) 10/18/2019   HGBA1C 6.0 07/05/2019   HGBA1C 5.9 05/27/2018   HGBA1C 5.6 04/02/2017   HGBA1C 5.7  06/03/2016   Lab Results  Component Value Date   TSH 1.55 05/27/2018   Lab Results  Component Value Date   CHOL 150 07/05/2019   HDL 46.20 07/05/2019   LDLCALC 79 07/05/2019   TRIG 121.0 07/05/2019   CHOLHDL 3 07/05/2019   Lab Results  Component Value Date   WBC 7.7 09/29/2019   HGB 14.5 09/29/2019   HCT 45.6 09/29/2019   MCV 93 09/29/2019   PLT 321 09/29/2019   Attestation Statements:   Reviewed by clinician on day of visit: allergies, medications, problem list, medical history, surgical history, family history, social history, and previous encounter notes.  I, Water quality scientist, CMA, am acting as Location manager for [employer], DO.  I have reviewed the above documentation for accuracy and completeness, and I agree with the above. Briscoe Deutscher, DO

## 2019-11-29 ENCOUNTER — Encounter (INDEPENDENT_AMBULATORY_CARE_PROVIDER_SITE_OTHER): Payer: Self-pay | Admitting: Family Medicine

## 2019-11-29 ENCOUNTER — Encounter: Payer: Self-pay | Admitting: Family Medicine

## 2019-11-29 ENCOUNTER — Other Ambulatory Visit: Payer: Self-pay

## 2019-11-29 ENCOUNTER — Ambulatory Visit (INDEPENDENT_AMBULATORY_CARE_PROVIDER_SITE_OTHER): Payer: BC Managed Care – PPO | Admitting: Family Medicine

## 2019-11-29 VITALS — HR 66 | Temp 98.3°F | Ht 62.0 in | Wt 205.0 lb

## 2019-11-29 DIAGNOSIS — M722 Plantar fascial fibromatosis: Secondary | ICD-10-CM

## 2019-11-29 DIAGNOSIS — Z6837 Body mass index (BMI) 37.0-37.9, adult: Secondary | ICD-10-CM

## 2019-11-29 DIAGNOSIS — G4700 Insomnia, unspecified: Secondary | ICD-10-CM | POA: Diagnosis not present

## 2019-11-29 DIAGNOSIS — Z9189 Other specified personal risk factors, not elsewhere classified: Secondary | ICD-10-CM

## 2019-11-29 DIAGNOSIS — R7303 Prediabetes: Secondary | ICD-10-CM | POA: Diagnosis not present

## 2019-11-29 MED ORDER — TRAZODONE HCL 50 MG PO TABS: 50.0000 mg | ORAL_TABLET | Freq: Every evening | ORAL | 0 refills | Status: DC | PRN

## 2019-11-29 MED ORDER — METFORMIN HCL 500 MG PO TABS: 500.0000 mg | ORAL_TABLET | Freq: Two times a day (BID) | ORAL | 0 refills | Status: DC

## 2019-11-29 NOTE — Progress Notes (Signed)
Chief Complaint:   OBESITY TRUE is here to discuss her progress with her obesity treatment plan along with follow-up of her obesity related diagnoses. Donna Mendoza is on the Category 2 Plan and states she is following her eating plan approximately 90% of the time. Donna Mendoza states she is getting steps in.  Today's visit was #: 8 Starting weight: 224 lbs Starting date: 08/03/2019 Today's weight: 205 lbs Today's date: 11/29/2019 Total lbs lost to date: 19 lbs Total lbs lost since last in-office visit: 4 lbs  Interim History: Donna Mendoza says she has doubled her steps.  She is taking metformin twice daily and has increased her Donna consumption.  She endorses mild polyphagia.  She says she has poor sleep and has been waking up around 3 am.  She is going to be traveling to [place] for a friend's birthday.  Subjective:   1. Prediabetes Donna Mendoza has a diagnosis of prediabetes based on her elevated HgA1c and was informed this puts her at greater risk of developing diabetes. She continues to work on diet and exercise to decrease her risk of diabetes. She denies nausea or hypoglycemia.  She is taking metformin 500 mg twice daily.  Lab Results  Component Value Date   HGBA1C 5.7 (H) 10/18/2019   2. Insomnia, unspecified type Donna Mendoza is not sleeping well.  Waking up too early and unable to go back to sleep.  3. At risk for heart disease Donna Mendoza is at a higher than average risk for cardiovascular disease due to obesity.   Assessment/Plan:   1. Prediabetes Donna Mendoza will continue to work on weight loss, exercise, and decreasing simple carbohydrates to help decrease the risk of diabetes.   Orders - metFORMIN (GLUCOPHAGE) 500 MG tablet; Take 1 tablet (500 mg total) by mouth 2 (two) times daily with a meal.  Dispense: 180 tablet; Refill: 0  2. Insomnia The problem of recurrent insomnia was discussed. Orders and follow up as documented in patient record. Counseling: Intensive lifestyle  modifications are the first line treatment for this issue. We discussed several lifestyle modifications today and she will continue to work on diet, exercise and weight loss efforts.   Counseling  Limit or avoid alcohol, caffeinated beverages, and cigarettes, especially close to bedtime.   Do not eat a large meal or eat spicy foods right before bedtime. This can lead to digestive discomfort that can make it hard for you to sleep.  Keep a sleep diary to help you and your health care provider figure out what could be causing your insomnia.  . Make your bedroom a dark, comfortable place where it is easy to fall asleep. ? Put up shades or blackout curtains to block light from outside. ? Use a white noise machine to block noise. ? Keep the temperature cool. . Limit screen use before bedtime. This includes: ? Watching TV. ? Using your smartphone, tablet, or computer. . Stick to a routine that includes going to bed and waking up at the same times every day and night. This can help you fall asleep faster. Consider making a quiet activity, such as reading, part of your nighttime routine. . Try to avoid taking naps during the day so that you sleep better at night. . Get out of bed if you are still awake after 15 minutes of trying to sleep. Keep the lights down, but try reading or doing a quiet activity. When you feel sleepy, go back to bed.  Orders - traZODone (DESYREL) 50 MG tablet; Take 1  tablet (50 mg total) by mouth at bedtime as needed for sleep.  Dispense: 30 tablet; Refill: 0  3. At risk for heart disease Donna Mendoza was given approximately 15 minutes of coronary artery disease prevention counseling today. She is 49 y.o. female and has risk factors for heart disease including obesity. We discussed intensive lifestyle modifications today with an emphasis on specific weight loss instructions and strategies.   Repetitive spaced learning was employed today to elicit superior memory formation and  behavioral change.  4. Class 2 severe obesity with serious comorbidity and body mass index (BMI) of 37.0 to 37.9 in adult, unspecified obesity type Donna Mendoza) Donna Mendoza is currently in the action stage of change. As such, her goal is to continue with weight loss efforts. She has agreed to the Category 2 Plan.   Exercise goals: For substantial health benefits, adults should do at least 150 minutes (2 hours and 30 minutes) a week of moderate-intensity, or 75 minutes (1 hour and 15 minutes) a week of vigorous-intensity aerobic physical activity, or an equivalent combination of moderate- and vigorous-intensity aerobic activity. Aerobic activity should be performed in episodes of at least 10 minutes, and preferably, it should be spread throughout the week.  Behavioral modification strategies: increasing Donna intake.  Donna Mendoza has agreed to follow-up with our clinic in 2 weeks. She was informed of the importance of frequent follow-up visits to maximize her success with intensive lifestyle modifications for her multiple health conditions.   Objective:   Pulse 66, temperature 98.3 F (36.8 C), temperature source Oral, height 5\' 2"  (1.575 m), weight 205 lb (93 kg), SpO2 96 %. Body mass index is 37.49 kg/m.  General: Cooperative, alert, well developed, in no acute distress. HEENT: Conjunctivae and lids unremarkable. Cardiovascular: Regular rhythm.  Lungs: Normal work of breathing. Neurologic: No focal deficits.   Lab Results  Component Value Date   CREATININE 0.68 09/29/2019   BUN 16 09/29/2019   NA 144 09/29/2019   K 4.5 09/29/2019   CL 107 (H) 09/29/2019   CO2 23 09/29/2019   Lab Results  Component Value Date   ALT 24 09/29/2019   AST 24 09/29/2019   ALKPHOS 80 09/29/2019   BILITOT 0.3 09/29/2019   Lab Results  Component Value Date   HGBA1C 5.7 (H) 10/18/2019   HGBA1C 6.0 07/05/2019   HGBA1C 5.9 05/27/2018   HGBA1C 5.6 04/02/2017   HGBA1C 5.7 06/03/2016   Lab Results  Component  Value Date   TSH 1.55 05/27/2018   Lab Results  Component Value Date   CHOL 150 07/05/2019   HDL 46.20 07/05/2019   LDLCALC 79 07/05/2019   TRIG 121.0 07/05/2019   CHOLHDL 3 07/05/2019   Lab Results  Component Value Date   WBC 7.7 09/29/2019   HGB 14.5 09/29/2019   HCT 45.6 09/29/2019   MCV 93 09/29/2019   PLT 321 09/29/2019   Attestation Statements:   Reviewed by clinician on day of visit: allergies, medications, problem list, medical history, surgical history, family history, social history, and previous encounter notes.  I, Donna Mendoza, CMA, am acting as Location manager for [employer], DO.  I have reviewed the above documentation for accuracy and completeness, and I agree with the above. Briscoe Deutscher, DO

## 2019-11-29 NOTE — Patient Instructions (Signed)
You have plantar fasciitis Take tylenol as needed for pain. Plantar fascia stretch for 20-30 seconds (do 3 of these) in morning Lowering/raise on a step exercises 3 x 10 once or twice a day - this is very important for long term recovery. Can add heel walks, toe walks forward and backward as well Ice heel for 15 minutes as needed. Avoid flat shoes/barefoot walking as much as possible. Continue with your inserts. Physical therapy is also an option. Follow up with me as needed for this. If pain becomes constant we can do an injection but I don't think we need to repeat this now - just make sure you focus on the exercises and stretches!

## 2019-11-29 NOTE — Progress Notes (Signed)
PCP: Brunetta Jeans, PA-C  Subjective:   HPI: Patient is a 49 y.o. female here for left foot pain.  07/05/19: Patient is making slow progress with her knees and they do feel better since last visit. She's been trying not to hit anything with right knee - not as tender in her sore sport Left knee does feel like it locks at times but is improving. Her left plantar fasciitis has flared in past month. Has done well with injection in past and would like to go ahead with this - feels like it lasts about a year. Wearing arch supports and not walking barefoot. Pain is 2/10 at worst and worse by end of day. No pain with rest and sitting. No skin changes, new injuries.  11/29/19: Patient reports she's done well since last visit following injection. She is doing a lot of walking now, trying to get at least 10K steps per day. She still has pain in plantar aspect of left foot though it's intermittent. Not doing home exercises/stretches. Is wearing shoes with her orthotics which are comfortable. No new injuries.  Past Medical History:  Diagnosis Date  . Gallbladder problem   . Heartburn   . History of chicken pox   . Venous thrombosis of leg    Left  . Vitamin D deficiency     Current Outpatient Medications on File Prior to Visit  Medication Sig Dispense Refill  . aspirin 81 MG tablet Take 81 mg by mouth daily.    Marland Kitchen aspirin-acetaminophen-caffeine (EXCEDRIN MIGRAINE) 250-250-65 MG tablet Take by mouth every 6 (six) hours as needed for headache.    . Calcium Carb-Cholecalciferol (CALCIUM 1000 + D PO) Take 1,000 mg by mouth.    Marland Kitchen CAMILA 0.35 MG tablet Take 1 tablet by mouth daily.  3  . Cholecalciferol (VITAMIN D3) 25 MCG (1000 UT) CAPS Take 1,000 Int'l Units by mouth.    . clobetasol ointment (TEMOVATE) 0.05 %     . Crisaborole (EUCRISA) 2 % OINT Apply 1 application topically daily. 60 g 1  . Glucosamine-Chondroit-Vit C-Mn (GLUCOSAMINE 1500 COMPLEX PO) Take 1,500 mg by mouth.    .  hydrocortisone 2.5 % cream Apply 1 application topically 2 (two) times daily as needed. 30 g 3  . ibuprofen (ADVIL) 800 MG tablet Take 1 tablet (800 mg total) by mouth every 8 (eight) hours as needed. 30 tablet 0  . ketoconazole (NIZORAL) 2 % cream Apply 1 application topically daily. 15 g 0  . metFORMIN (GLUCOPHAGE) 500 MG tablet Take 1 tablet (500 mg total) by mouth 2 (two) times daily with a meal. 180 tablet 0  . pantoprazole (PROTONIX) 40 MG tablet Take 1 tablet (40 mg total) by mouth every other day. 90 tablet 0  . traZODone (DESYREL) 50 MG tablet Take 1 tablet (50 mg total) by mouth at bedtime as needed for sleep. 30 tablet 0  . Turmeric Curcumin 500 MG CAPS Take 500 mg by mouth.     No current facility-administered medications on file prior to visit.    Past Surgical History:  Procedure Laterality Date  . BREAST SURGERY     Fibroidadenoma-Left  . CHOLECYSTECTOMY    . Neck Fusion  06/2016  . REFRACTIVE SURGERY     Bilateral  . ruptured disc    . TONSILLECTOMY    . WISDOM TOOTH EXTRACTION      Allergies  Allergen Reactions  . Amoxil [Amoxicillin] Hives  . Naproxen Rash    Social History  Socioeconomic History  . Marital status: Single    Spouse name: Not on file  . Number of children: Not on file  . Years of education: Not on file  . Highest education level: Not on file  Occupational History  . Not on file  Tobacco Use  . Smoking status: Never Smoker  . Smokeless tobacco: Never Used  Substance and Sexual Activity  . Alcohol use: Yes    Alcohol/week: 0.0 standard drinks    Comment: rare  . Drug use: No  . Sexual activity: Yes    Partners: Male    Birth control/protection: Pill    Comment: boyfriend  Other Topics Concern  . Not on file  Social History Narrative  . Not on file   Social Determinants of Health   Financial Resource Strain:   . Difficulty of Paying Living Expenses:   Food Insecurity:   . Worried About Charity fundraiser in the Last Year:    . Arboriculturist in the Last Year:   Transportation Needs:   . Film/video editor (Medical):   Marland Kitchen Lack of Transportation (Non-Medical):   Physical Activity:   . Days of Exercise per Week:   . Minutes of Exercise per Session:   Stress:   . Feeling of Stress :   Social Connections:   . Frequency of Communication with Friends and Family:   . Frequency of Social Gatherings with Friends and Family:   . Attends Religious Services:   . Active Member of Clubs or Organizations:   . Attends Archivist Meetings:   Marland Kitchen Marital Status:   Intimate Partner Violence:   . Fear of Current or Ex-Partner:   . Emotionally Abused:   Marland Kitchen Physically Abused:   . Sexually Abused:     Family History  Problem Relation Age of Onset  . Brain cancer Mother 15       Deceased-Tumor  . Obesity Mother   . Glaucoma Maternal Grandfather   . Glaucoma Maternal Aunt   . Cancer Maternal Grandmother   . Lung cancer Maternal Aunt   . Healthy Sister        x1    BP 118/86   Ht 5\' 2"  (1.575 m)   Wt 205 lb 12.8 oz (93.4 kg)   BMI 37.64 kg/m   Review of Systems: See HPI above.     Objective:  Physical Exam:  Gen: NAD, comfortable in exam room  Left foot/ankle: No gross deformity, swelling, ecchymoses FROM with normal strength all directions. TTP minimally plantar fascia insertion on medial calcaneus. Negative calcaneal squeeze. Thompsons test negative. NV intact distally.   Assessment & Plan:  1. Left plantar fasciitis - improved compared to last visit.  Pain only intermittent.  Would not repeat injection at this time.  Continue arch supports.  Stressed importance of home exercises and stretches. Tylenol, icing as needed.  F/u prn.

## 2019-12-01 ENCOUNTER — Other Ambulatory Visit (INDEPENDENT_AMBULATORY_CARE_PROVIDER_SITE_OTHER): Payer: Self-pay | Admitting: Family Medicine

## 2019-12-01 DIAGNOSIS — R7303 Prediabetes: Secondary | ICD-10-CM

## 2019-12-27 ENCOUNTER — Other Ambulatory Visit: Payer: Self-pay

## 2019-12-27 ENCOUNTER — Ambulatory Visit (INDEPENDENT_AMBULATORY_CARE_PROVIDER_SITE_OTHER): Payer: BC Managed Care – PPO | Admitting: Family Medicine

## 2019-12-27 ENCOUNTER — Encounter (INDEPENDENT_AMBULATORY_CARE_PROVIDER_SITE_OTHER): Payer: Self-pay | Admitting: Family Medicine

## 2019-12-27 VITALS — BP 112/71 | HR 60 | Temp 97.9°F | Ht 62.0 in | Wt 203.0 lb

## 2019-12-27 DIAGNOSIS — G4700 Insomnia, unspecified: Secondary | ICD-10-CM | POA: Diagnosis not present

## 2019-12-27 DIAGNOSIS — K219 Gastro-esophageal reflux disease without esophagitis: Secondary | ICD-10-CM | POA: Diagnosis not present

## 2019-12-27 DIAGNOSIS — Z9189 Other specified personal risk factors, not elsewhere classified: Secondary | ICD-10-CM | POA: Diagnosis not present

## 2019-12-27 DIAGNOSIS — Z6837 Body mass index (BMI) 37.0-37.9, adult: Secondary | ICD-10-CM

## 2019-12-27 DIAGNOSIS — R7303 Prediabetes: Secondary | ICD-10-CM

## 2019-12-27 MED ORDER — PANTOPRAZOLE SODIUM 40 MG PO TBEC: 40.0000 mg | DELAYED_RELEASE_TABLET | ORAL | 0 refills | Status: DC

## 2019-12-27 NOTE — Progress Notes (Signed)
Chief Complaint:   OBESITY Donna Mendoza is here to discuss her progress with her obesity treatment plan along with follow-up of her obesity related diagnoses. Donna Mendoza is on the Category 2 Plan and states she is following her eating plan approximately 80% of the time. Donna Mendoza states she is walking 10,000 steps 5 times per week.  Today's visit was #: 9 Starting weight: 224 lbs Starting date: 08/03/2019 Today's weight: 203 lbs Today's date: 12/27/2019 Total lbs lost to date: 21 lbs Total lbs lost since last in-office visit: 2 lbs  Interim History: Donna Mendoza is 21 pounds down today.  She says that trazodone caused a pin pricks feeling in her legs and feet.  She is leaving until August 7th, and will be staying with her "chosen dad".  She is doing well with breakfast and lunch.  Her husband has been diagnosed with fatty liver.  She says she got 5 pound weights to start strength training.  Subjective:   1. Gastroesophageal reflux disease without esophagitis Donna Mendoza takes Protonix 40 mg every other day for GERD.  2. Insomnia, unspecified type Trazodone was causing side effects, so we will discontinue.  3. Prediabetes Donna Mendoza has a diagnosis of prediabetes based on her elevated HgA1c and was informed this puts her at greater risk of developing diabetes. She continues to work on diet and exercise to decrease her risk of diabetes. She denies nausea or hypoglycemia.  Lab Results  Component Value Date   HGBA1C 5.7 (H) 10/18/2019   Assessment/Plan:   1. Gastroesophageal reflux disease without esophagitis Intensive lifestyle modifications are the first line treatment for this issue. We discussed several lifestyle modifications today and she will continue to work on diet, exercise and weight loss efforts. Orders and follow up as documented in patient record.   Counseling . If a person has gastroesophageal reflux disease (GERD), food and stomach acid move back up into the esophagus and cause  symptoms or problems such as damage to the esophagus. . Anti-reflux measures include: raising the head of the bed, avoiding tight clothing or belts, avoiding eating late at night, not lying down shortly after mealtime, and achieving weight loss. . Avoid ASA, NSAID's, caffeine, alcohol, and tobacco.  . OTC Pepcid and/or Tums are often very helpful for as needed use.  Marland Kitchen However, for persisting chronic or daily symptoms, stronger medications like Omeprazole may be needed. . You may need to avoid foods and drinks such as: ? Coffee and tea (with or without caffeine). ? Drinks that contain alcohol. ? Energy drinks and sports drinks. ? Bubbly (carbonated) drinks or sodas. ? Chocolate and cocoa. ? Peppermint and mint flavorings. ? Garlic and onions. ? Horseradish. ? Spicy and acidic foods. These include peppers, chili powder, curry powder, vinegar, hot sauces, and BBQ sauce. ? Citrus fruit juices and citrus fruits, such as oranges, lemons, and limes. ? Tomato-based foods. These include red sauce, chili, salsa, and pizza with red sauce. ? Fried and fatty foods. These include donuts, french fries, potato chips, and high-fat dressings. ? High-fat meats. These include hot dogs, rib eye steak, sausage, ham, and bacon.  Orders - pantoprazole (PROTONIX) 40 MG tablet; Take 1 tablet (40 mg total) by mouth every other day.  Dispense: 90 tablet; Refill: 0  2. Insomnia Counseling: Intensive lifestyle modifications are the first line treatment for this issue. We discussed several lifestyle modifications today and she will continue to work on diet, exercise and weight loss efforts.   3. Prediabetes Donna Mendoza will continue to  work on weight loss, exercise, and decreasing simple carbohydrates to help decrease the risk of diabetes.   4. At risk for heart disease Donna Mendoza was given approximately 15 minutes of coronary artery disease prevention counseling today. She is 49 y.o. female and has risk factors for heart  disease including obesity. We discussed intensive lifestyle modifications today with an emphasis on specific weight loss instructions and strategies.   Repetitive spaced learning was employed today to elicit superior memory formation and behavioral change.  5. Class 2 severe obesity with serious comorbidity and body mass index (BMI) of 37.0 to 37.9 in adult, unspecified obesity type Donna Mendoza) Donna Mendoza is currently in the action stage of change. As such, her goal is to continue with weight loss efforts. She has agreed to the Category 2 Plan.   Exercise goals: For substantial health benefits, adults should do at least 150 minutes (2 hours and 30 minutes) a week of moderate-intensity, or 75 minutes (1 hour and 15 minutes) a week of vigorous-intensity aerobic physical activity, or an equivalent combination of moderate- and vigorous-intensity aerobic activity. Aerobic activity should be performed in episodes of at least 10 minutes, and preferably, it should be spread throughout the week.  Behavioral modification strategies: increasing lean protein intake, increasing water intake, meal planning and cooking strategies and emotional eating strategies.  Donna Mendoza has agreed to follow-up with our clinic in August. She was informed of the importance of frequent follow-up visits to maximize her success with intensive lifestyle modifications for her multiple health conditions.   Objective:   Blood pressure 112/71, pulse 60, temperature 97.9 F (36.6 C), temperature source Oral, height 5\' 2"  (1.575 m), weight 203 lb (92.1 kg), SpO2 98 %. Body mass index is 37.13 kg/m.  General: Cooperative, alert, well developed, in no acute distress. HEENT: Conjunctivae and lids unremarkable. Cardiovascular: Regular rhythm.  Lungs: Normal work of breathing. Neurologic: No focal deficits.   Lab Results  Component Value Date   CREATININE 0.68 09/29/2019   BUN 16 09/29/2019   NA 144 09/29/2019   K 4.5 09/29/2019   CL 107  (H) 09/29/2019   CO2 23 09/29/2019   Lab Results  Component Value Date   ALT 24 09/29/2019   AST 24 09/29/2019   ALKPHOS 80 09/29/2019   BILITOT 0.3 09/29/2019   Lab Results  Component Value Date   HGBA1C 5.7 (H) 10/18/2019   HGBA1C 6.0 07/05/2019   HGBA1C 5.9 05/27/2018   HGBA1C 5.6 04/02/2017   HGBA1C 5.7 06/03/2016   Lab Results  Component Value Date   TSH 1.55 05/27/2018   Lab Results  Component Value Date   CHOL 150 07/05/2019   HDL 46.20 07/05/2019   LDLCALC 79 07/05/2019   TRIG 121.0 07/05/2019   CHOLHDL 3 07/05/2019   Lab Results  Component Value Date   WBC 7.7 09/29/2019   HGB 14.5 09/29/2019   HCT 45.6 09/29/2019   MCV 93 09/29/2019   PLT 321 09/29/2019   Attestation Statements:   Reviewed by clinician on day of visit: allergies, medications, problem list, medical history, surgical history, family history, social history, and previous encounter notes.  I, Water quality scientist, CMA, am acting as transcriptionist for Briscoe Deutscher, DO  I have reviewed the above documentation for accuracy and completeness, and I agree with the above. Briscoe Deutscher, DO

## 2020-01-19 MED ORDER — DICLOFENAC SODIUM 75 MG PO TBEC
75.0000 mg | DELAYED_RELEASE_TABLET | Freq: Two times a day (BID) | ORAL | 1 refills | Status: DC
Start: 2020-01-19 — End: 2020-06-08

## 2020-01-19 NOTE — Addendum Note (Signed)
Addended by: Dene Gentry on: 01/19/2020 04:43 PM   Modules accepted: Orders

## 2020-02-28 ENCOUNTER — Other Ambulatory Visit: Payer: Self-pay

## 2020-02-28 ENCOUNTER — Encounter (INDEPENDENT_AMBULATORY_CARE_PROVIDER_SITE_OTHER): Payer: Self-pay | Admitting: Family Medicine

## 2020-02-28 ENCOUNTER — Ambulatory Visit (INDEPENDENT_AMBULATORY_CARE_PROVIDER_SITE_OTHER): Payer: BC Managed Care – PPO | Admitting: Family Medicine

## 2020-02-28 VITALS — BP 109/71 | HR 65 | Temp 98.0°F | Ht 62.0 in | Wt 198.0 lb

## 2020-02-28 DIAGNOSIS — M25562 Pain in left knee: Secondary | ICD-10-CM

## 2020-02-28 DIAGNOSIS — Z79899 Other long term (current) drug therapy: Secondary | ICD-10-CM

## 2020-02-28 DIAGNOSIS — R7303 Prediabetes: Secondary | ICD-10-CM

## 2020-02-28 DIAGNOSIS — Z9189 Other specified personal risk factors, not elsewhere classified: Secondary | ICD-10-CM | POA: Diagnosis not present

## 2020-02-28 DIAGNOSIS — E559 Vitamin D deficiency, unspecified: Secondary | ICD-10-CM | POA: Diagnosis not present

## 2020-02-28 DIAGNOSIS — Z6836 Body mass index (BMI) 36.0-36.9, adult: Secondary | ICD-10-CM

## 2020-02-28 MED ORDER — METFORMIN HCL 500 MG PO TABS: 500.0000 mg | ORAL_TABLET | Freq: Two times a day (BID) | ORAL | 0 refills | Status: DC

## 2020-02-28 NOTE — Progress Notes (Signed)
Chief Complaint:   OBESITY Donna Mendoza is here to discuss her progress with her obesity treatment plan along with follow-up of her obesity related diagnoses. Donna Mendoza is on the Category 2 Plan and states she is following her eating plan approximately 45-50% of the time. Donna Mendoza states she is walking 10,000 steps 4-5 days per week.  Today's visit was #: 10 Starting weight: 224 lbs Starting date: 08/03/2019 Today's weight: 198 lbs Today's date: 02/28/2020 Total lbs lost to date: 26 lbs Total lbs lost since last in-office visit: 5 lbs Total weight loss percentage to date: -11.61%  Interim History: Donna Mendoza says she had a great time in [place].  She did a lot of walking while there.  Her goal is to be able to do a 5k in the future.  She reports some issues with her left knee - effusion/OA last week.  Subjective:   1. Left knee pain, acute on chronic Donna Mendoza is experiencing some pain in her left knee.  2. Prediabetes  Lab Results  Component Value Date   HGBA1C 5.7 (H) 10/18/2019   3. Vitamin D deficiency Donna Mendoza's Vitamin D level was 51.8 on 09/29/2019. She is currently taking prescription vitamin D 50,000 IU each week.   4. Medication management Medications were reviewed in detail today.  Assessment/Plan:   1. Left knee pain Will follow because mobility and pain control are important for weight management.  2. Prediabetes Not optimized. Goal is HgbA1c < 5.7 and insulin level closer to 5. Donna Mendoza will continue to work on weight loss, exercise, and decreasing simple carbohydrates to help decrease the risk of diabetes.   Orders - CBC with Differential/Platelet - Comprehensive metabolic panel - Hemoglobin A1c - metFORMIN (GLUCOPHAGE) 500 MG tablet; Take 1 tablet (500 mg total) by mouth 2 (two) times daily with a meal.  Dispense: 180 tablet; Refill: 0  3. Vitamin D deficiency The current medical regimen is effective;  continue present plan and medications. She agrees to  continue to take prescription Vitamin D @50 ,000 IU every week and will follow-up for routine testing of Vitamin D, at least 2-3 times per year to avoid over-replacement.  Orders - VITAMIN D 25 Hydroxy (Vit-D Deficiency, Fractures)  4. Medication management Will check vitamin B12 level today since she is taking metformin long-term.  Orders - CBC with Differential/Platelet - Comprehensive metabolic panel - Vitamin T26  5. At risk for heart disease Donna Mendoza was given approximately 15 minutes of coronary artery disease prevention counseling today. She is 49 y.o. female and has risk factors for heart disease including and prediabetes. We discussed intensive lifestyle modifications today with an emphasis on specific weight loss instructions and strategies.   During insulin resistance, several metabolic alterations induce the development of cardiovascular disease. For instance, insulin resistance can induce an imbalance in glucose metabolism that generates chronic hyperglycemia, which in turn triggers oxidative stress and causes an inflammatory response that leads to cell damage. Insulin resistance can also alter systemic lipid metabolism which then leads to the development of dyslipidemia and the well-known lipid triad: (1) high levels of plasma triglycerides, (2) low levels of high-density lipoprotein, and (3) the appearance of small dense low-density lipoproteins. This triad, along with endothelial dysfunction, which can also be induced by aberrant insulin signaling, contribute to atherosclerotic plaque formation.   6. Class 2 severe obesity with serious comorbidity and body mass index (BMI) of 36.0 to 36.9 in adult, unspecified obesity type [facility]) Donna Mendoza is currently in the action stage of change. As  such, her goal is to continue with weight loss efforts. She has agreed to the Category 2 Plan.   Exercise goals: For substantial health benefits, adults should do at least 150 minutes (2 hours and 30  minutes) a week of moderate-intensity, or 75 minutes (1 hour and 15 minutes) a week of vigorous-intensity aerobic physical activity, or an equivalent combination of moderate- and vigorous-intensity aerobic activity. Aerobic activity should be performed in episodes of at least 10 minutes, and preferably, it should be spread throughout the week.  Behavioral modification strategies: increasing lean protein intake and increasing high fiber foods.  Donna Mendoza has agreed to follow-up with our clinic in 3-4 weeks. She was informed of the importance of frequent follow-up visits to maximize her success with intensive lifestyle modifications for her multiple health conditions.   Donna Mendoza was informed we would discuss her lab results at her next visit unless there is a critical issue that needs to be addressed sooner. Donna Mendoza agreed to keep her next visit at the agreed upon time to discuss these results.  Objective:   Blood pressure 109/71, pulse 65, temperature 98 F (36.7 C), temperature source Oral, height 5\' 2"  (1.575 m), weight 198 lb (89.8 kg), SpO2 95 %. Body mass index is 36.21 kg/m.  General: Cooperative, alert, well developed, in no acute distress. HEENT: Conjunctivae and lids unremarkable. Cardiovascular: Regular rhythm.  Lungs: Normal work of breathing. Neurologic: No focal deficits.   Lab Results  Component Value Date   CREATININE 0.68 09/29/2019   BUN 16 09/29/2019   NA 144 09/29/2019   K 4.5 09/29/2019   CL 107 (H) 09/29/2019   CO2 23 09/29/2019   Lab Results  Component Value Date   ALT 24 09/29/2019   AST 24 09/29/2019   ALKPHOS 80 09/29/2019   BILITOT 0.3 09/29/2019   Lab Results  Component Value Date   HGBA1C 5.7 (H) 10/18/2019   HGBA1C 6.0 07/05/2019   HGBA1C 5.9 05/27/2018   HGBA1C 5.6 04/02/2017   HGBA1C 5.7 06/03/2016   Lab Results  Component Value Date   TSH 1.55 05/27/2018   Lab Results  Component Value Date   CHOL 150 07/05/2019   HDL 46.20 07/05/2019     LDLCALC 79 07/05/2019   TRIG 121.0 07/05/2019   CHOLHDL 3 07/05/2019   Lab Results  Component Value Date   WBC 7.7 09/29/2019   HGB 14.5 09/29/2019   HCT 45.6 09/29/2019   MCV 93 09/29/2019   PLT 321 09/29/2019   Attestation Statements:   Reviewed by clinician on day of visit: allergies, medications, problem list, medical history, surgical history, family history, social history, and previous encounter notes.  I, Water quality scientist, CMA, am acting as transcriptionist for Briscoe Deutscher, DO  I have reviewed the above documentation for accuracy and completeness, and I agree with the above. Briscoe Deutscher, DO

## 2020-02-29 LAB — COMPREHENSIVE METABOLIC PANEL WITH GFR
ALT: 28 IU/L (ref 0–32)
AST: 28 IU/L (ref 0–40)
Albumin/Globulin Ratio: 1.6 (ref 1.2–2.2)
Albumin: 4.1 g/dL (ref 3.8–4.8)
Alkaline Phosphatase: 87 IU/L (ref 48–121)
BUN/Creatinine Ratio: 23 (ref 9–23)
BUN: 17 mg/dL (ref 6–24)
Bilirubin Total: 0.5 mg/dL (ref 0.0–1.2)
CO2: 26 mmol/L (ref 20–29)
Calcium: 9.3 mg/dL (ref 8.7–10.2)
Chloride: 106 mmol/L (ref 96–106)
Creatinine, Ser: 0.75 mg/dL (ref 0.57–1.00)
GFR calc Af Amer: 107 mL/min/1.73 (ref >59)
GFR calc non Af Amer: 93 mL/min/1.73 (ref >59)
Globulin, Total: 2.5 g/dL (ref 1.5–4.5)
Glucose: 94 mg/dL (ref 65–99)
Potassium: 4.5 mmol/L (ref 3.5–5.2)
Sodium: 143 mmol/L (ref 134–144)
Total Protein: 6.6 g/dL (ref 6.0–8.5)

## 2020-02-29 LAB — CBC WITH DIFFERENTIAL/PLATELET
Basophils Absolute: 0 10*3/uL (ref 0.0–0.2)
Basos: 1 %
EOS (ABSOLUTE): 0.2 10*3/uL (ref 0.0–0.4)
Eos: 3 %
Hematocrit: 44 % (ref 34.0–46.6)
Hemoglobin: 13.9 g/dL (ref 11.1–15.9)
Immature Grans (Abs): 0 10*3/uL (ref 0.0–0.1)
Immature Granulocytes: 0 %
Lymphocytes Absolute: 1.3 10*3/uL (ref 0.7–3.1)
Lymphs: 22 %
MCH: 28.8 pg (ref 26.6–33.0)
MCHC: 31.6 g/dL (ref 31.5–35.7)
MCV: 91 fL (ref 79–97)
Monocytes Absolute: 0.5 10*3/uL (ref 0.1–0.9)
Monocytes: 9 %
Neutrophils Absolute: 3.9 10*3/uL (ref 1.4–7.0)
Neutrophils: 65 %
Platelets: 297 10*3/uL (ref 150–450)
RBC: 4.83 x10E6/uL (ref 3.77–5.28)
RDW: 13.6 % (ref 11.7–15.4)
WBC: 6 10*3/uL (ref 3.4–10.8)

## 2020-02-29 LAB — VITAMIN D 25 HYDROXY (VIT D DEFICIENCY, FRACTURES): Vit D, 25-Hydroxy: 49.8 ng/mL (ref 30.0–100.0)

## 2020-02-29 LAB — HEMOGLOBIN A1C
Est. average glucose Bld gHb Est-mCnc: 117 mg/dL
Hgb A1c MFr Bld: 5.7 % — ABNORMAL HIGH (ref 4.8–5.6)

## 2020-02-29 LAB — LIPID PANEL WITH LDL/HDL RATIO
Cholesterol, Total: 203 mg/dL — ABNORMAL HIGH (ref 100–199)
HDL: 59 mg/dL (ref >39)
LDL Chol Calc (NIH): 128 mg/dL — ABNORMAL HIGH (ref 0–99)
LDL/HDL Ratio: 2.2 ratio (ref 0.0–3.2)
Triglycerides: 88 mg/dL (ref 0–149)
VLDL Cholesterol Cal: 16 mg/dL (ref 5–40)

## 2020-02-29 LAB — VITAMIN B12: Vitamin B-12: 684 pg/mL (ref 232–1245)

## 2020-03-09 ENCOUNTER — Encounter: Payer: Self-pay | Admitting: Physician Assistant

## 2020-03-10 ENCOUNTER — Other Ambulatory Visit: Payer: Self-pay | Admitting: Physician Assistant

## 2020-03-10 DIAGNOSIS — Z1211 Encounter for screening for malignant neoplasm of colon: Secondary | ICD-10-CM

## 2020-03-14 ENCOUNTER — Encounter: Payer: Self-pay | Admitting: Gastroenterology

## 2020-03-17 ENCOUNTER — Ambulatory Visit (INDEPENDENT_AMBULATORY_CARE_PROVIDER_SITE_OTHER): Payer: BC Managed Care – PPO

## 2020-03-17 ENCOUNTER — Other Ambulatory Visit: Payer: Self-pay

## 2020-03-17 DIAGNOSIS — Z23 Encounter for immunization: Secondary | ICD-10-CM | POA: Diagnosis not present

## 2020-03-17 NOTE — Progress Notes (Signed)
Donna Mendoza, 49 y.o. female, presents to the office today for her 1st [facility] vaccine per PCP orders. Vaccine administered in the left deltoid. Patient informed that her second dose will need to be administered anywhere from 2-6 months later. Patient voiced understanding and left the office in good condition. Fritz Pickerel, LPN

## 2020-03-23 ENCOUNTER — Other Ambulatory Visit: Payer: Self-pay

## 2020-03-23 ENCOUNTER — Encounter (INDEPENDENT_AMBULATORY_CARE_PROVIDER_SITE_OTHER): Payer: Self-pay | Admitting: Family Medicine

## 2020-03-23 ENCOUNTER — Ambulatory Visit (INDEPENDENT_AMBULATORY_CARE_PROVIDER_SITE_OTHER): Payer: BC Managed Care – PPO | Admitting: Family Medicine

## 2020-03-23 VITALS — BP 112/78 | HR 58 | Temp 98.3°F | Ht 62.0 in | Wt 197.0 lb

## 2020-03-23 DIAGNOSIS — Z9189 Other specified personal risk factors, not elsewhere classified: Secondary | ICD-10-CM | POA: Diagnosis not present

## 2020-03-23 DIAGNOSIS — R7303 Prediabetes: Secondary | ICD-10-CM

## 2020-03-23 DIAGNOSIS — Z6836 Body mass index (BMI) 36.0-36.9, adult: Secondary | ICD-10-CM | POA: Diagnosis not present

## 2020-03-23 NOTE — Progress Notes (Signed)
Chief Complaint:   OBESITY Donna Mendoza is here to discuss her progress with her obesity treatment plan along with follow-up of her obesity related diagnoses. Donna Mendoza is on the Category 2 Plan and states she is following her eating plan approximately 70% of the time. Donna Mendoza states she is exercising for 0 minutes 0 times per week.  Today's visit was #: 11 Starting weight: 224 lbs Starting date: 08/03/2019 Today's weight: 197 lbs Today's date: 03/23/2020 Total lbs lost to date: 27 lbs Total lbs lost since last in-office visit: 1 lb Total weight loss percentage to date: 12.05%  Interim History: Donna Mendoza says she got a promotion.  She has been working 12 hour days, but there is an end in sight.  She may be going to [place] for work in a few weeks.  She will be taking a vacation after.  Assessment/Plan:   1. Prediabetes Lab Results  Component Value Date   HGBA1C 5.7 (H) 02/28/2020   Goal is HgbA1c < 5.7 and insulin level closer to 5.  She is taking metformin. The current medical regimen is effective;  continue present plan and medications.  2. At risk for heart disease Donna Mendoza was given approximately 15 minutes of coronary artery disease prevention counseling today. She is 49 y.o. female and has risk factors for heart disease including obesity and prediabetes. We discussed intensive lifestyle modifications today with an emphasis on specific weight loss instructions and strategies.  Reviewed labs.  Improving.  Repetitive spaced learning was employed today to elicit superior memory formation and behavioral change.  3. Class 2 severe obesity with serious comorbidity and body mass index (BMI) of 36.0 to 36.9 in adult, unspecified obesity type [facility]) Donna Mendoza is currently in the action stage of change. As such, her goal is to continue with weight loss efforts. She has agreed to the Category 2 Plan.   Exercise goals: For substantial health benefits, adults should do at least 150 minutes  (2 hours and 30 minutes) a week of moderate-intensity, or 75 minutes (1 hour and 15 minutes) a week of vigorous-intensity aerobic physical activity, or an equivalent combination of moderate- and vigorous-intensity aerobic activity. Aerobic activity should be performed in episodes of at least 10 minutes, and preferably, it should be spread throughout the week.  Behavioral modification strategies: increasing lean protein intake.  Donna Mendoza has agreed to follow-up with our clinic in 3-4 weeks. She was informed of the importance of frequent follow-up visits to maximize her success with intensive lifestyle modifications for her multiple health conditions.   Objective:   Blood pressure 112/78, pulse (!) 58, temperature 98.3 F (36.8 C), temperature source Oral, height 5\' 2"  (1.575 m), weight 197 lb (89.4 kg), SpO2 98 %. Body mass index is 36.03 kg/m.  General: Cooperative, alert, well developed, in no acute distress. HEENT: Conjunctivae and lids unremarkable. Cardiovascular: Regular rhythm.  Lungs: Normal work of breathing. Neurologic: No focal deficits.   Lab Results  Component Value Date   CREATININE 0.75 02/28/2020   BUN 17 02/28/2020   NA 143 02/28/2020   K 4.5 02/28/2020   CL 106 02/28/2020   CO2 26 02/28/2020   Lab Results  Component Value Date   ALT 28 02/28/2020   AST 28 02/28/2020   ALKPHOS 87 02/28/2020   BILITOT 0.5 02/28/2020   Lab Results  Component Value Date   HGBA1C 5.7 (H) 02/28/2020   HGBA1C 5.7 (H) 10/18/2019   HGBA1C 6.0 07/05/2019   HGBA1C 5.9 05/27/2018   HGBA1C 5.6  04/02/2017   Lab Results  Component Value Date   TSH 1.55 05/27/2018   Lab Results  Component Value Date   CHOL 203 (H) 02/28/2020   HDL 59 02/28/2020   LDLCALC 128 (H) 02/28/2020   TRIG 88 02/28/2020   CHOLHDL 3 07/05/2019   Lab Results  Component Value Date   WBC 6.0 02/28/2020   HGB 13.9 02/28/2020   HCT 44.0 02/28/2020   MCV 91 02/28/2020   PLT 297 02/28/2020   Attestation  Statements:   Reviewed by clinician on day of visit: allergies, medications, problem list, medical history, surgical history, family history, social history, and previous encounter notes.  I, Water quality scientist, CMA, am acting as transcriptionist for Briscoe Deutscher, DO  I have reviewed the above documentation for accuracy and completeness, and I agree with the above. Briscoe Deutscher, DO

## 2020-04-17 ENCOUNTER — Ambulatory Visit (INDEPENDENT_AMBULATORY_CARE_PROVIDER_SITE_OTHER): Payer: BC Managed Care – PPO | Admitting: Family Medicine

## 2020-04-17 ENCOUNTER — Encounter (INDEPENDENT_AMBULATORY_CARE_PROVIDER_SITE_OTHER): Payer: Self-pay | Admitting: Family Medicine

## 2020-04-17 ENCOUNTER — Other Ambulatory Visit: Payer: Self-pay

## 2020-04-17 VITALS — BP 117/84 | HR 62 | Temp 98.3°F | Ht 62.0 in | Wt 198.0 lb

## 2020-04-17 DIAGNOSIS — Z6836 Body mass index (BMI) 36.0-36.9, adult: Secondary | ICD-10-CM | POA: Diagnosis not present

## 2020-04-17 DIAGNOSIS — F3289 Other specified depressive episodes: Secondary | ICD-10-CM | POA: Diagnosis not present

## 2020-04-17 DIAGNOSIS — R7303 Prediabetes: Secondary | ICD-10-CM

## 2020-04-17 DIAGNOSIS — E66812 Obesity, class 2: Secondary | ICD-10-CM

## 2020-04-19 NOTE — Progress Notes (Signed)
Chief Complaint:   OBESITY Donna Mendoza is here to discuss her progress with her obesity treatment plan along with follow-up of her obesity related diagnoses. Donna Mendoza is on the Category 2 Plan and states she is following her eating plan approximately 40% of the time. Donna Mendoza states she is exercising for 0 minutes 0 times per week.  Today's visit was #: 12 Starting weight: 224 lbs Starting date: 08/03/2019 Today's weight: 198 lbs Today's date: 04/17/2020 Total lbs lost to date: 26 lbs Total lbs lost since last in-office visit: 0 Total weight loss percentage to date: -11.61%  Interim History: Donna Mendoza says that October 24 is the date to end "double duty" at work.  She says she will be going to [employer] this weekend.  She has had increased family stress and she finds herself snacking and emotionally eating.  Assessment/Plan:   1. Prediabetes Goal is HgbA1c < 5.7 and insulin level closer to 5. She will continue to focus on protein-rich, low simple carbohydrate foods. We reviewed the importance of hydration, regular exercise for stress reduction, and restorative sleep.   Lab Results  Component Value Date   HGBA1C 5.7 (H) 02/28/2020   2. Other depression, with emotional eating  Behavior modification techniques were discussed today to help Donna Mendoza deal with her emotional/non-hunger eating behaviors.    3. Class 2 severe obesity with serious comorbidity and body mass index (BMI) of 36.0 to 36.9 in adult, unspecified obesity type [facility])  Donna Mendoza is currently in the action stage of change. As such, her goal is to continue with weight loss efforts. She has agreed to the Category 2 Plan.   Exercise goals: For substantial health benefits, adults should do at least 150 minutes (2 hours and 30 minutes) a week of moderate-intensity, or 75 minutes (1 hour and 15 minutes) a week of vigorous-intensity aerobic physical activity, or an equivalent combination of moderate- and vigorous-intensity  aerobic activity. Aerobic activity should be performed in episodes of at least 10 minutes, and preferably, it should be spread throughout the week.  Behavioral modification strategies: increasing lean protein intake.  Focus on a high volume of low calorie foods.  Donna Mendoza has agreed to follow-up with our clinic in 4 weeks. She was informed of the importance of frequent follow-up visits to maximize her success with intensive lifestyle modifications for her multiple health conditions.   Objective:   Blood pressure 117/84, pulse 62, temperature 98.3 F (36.8 C), temperature source Oral, height 5\' 2"  (1.575 m), weight 198 lb (89.8 kg), SpO2 98 %. Body mass index is 36.21 kg/m.  General: Cooperative, alert, well developed, in no acute distress. HEENT: Conjunctivae and lids unremarkable. Cardiovascular: Regular rhythm.  Lungs: Normal work of breathing. Neurologic: No focal deficits.   Lab Results  Component Value Date   CREATININE 0.75 02/28/2020   BUN 17 02/28/2020   NA 143 02/28/2020   K 4.5 02/28/2020   CL 106 02/28/2020   CO2 26 02/28/2020   Lab Results  Component Value Date   ALT 28 02/28/2020   AST 28 02/28/2020   ALKPHOS 87 02/28/2020   BILITOT 0.5 02/28/2020   Lab Results  Component Value Date   HGBA1C 5.7 (H) 02/28/2020   HGBA1C 5.7 (H) 10/18/2019   HGBA1C 6.0 07/05/2019   HGBA1C 5.9 05/27/2018   HGBA1C 5.6 04/02/2017   Lab Results  Component Value Date   TSH 1.55 05/27/2018   Lab Results  Component Value Date   CHOL 203 (H) 02/28/2020   HDL  59 02/28/2020   LDLCALC 128 (H) 02/28/2020   TRIG 88 02/28/2020   CHOLHDL 3 07/05/2019   Lab Results  Component Value Date   WBC 6.0 02/28/2020   HGB 13.9 02/28/2020   HCT 44.0 02/28/2020   MCV 91 02/28/2020   PLT 297 02/28/2020   Attestation Statements:   Reviewed by clinician on day of visit: allergies, medications, problem list, medical history, surgical history, family history, social history, and previous  encounter notes.  Time spent on visit including pre-visit chart review and post-visit care and charting was 30 minutes.   I, Water quality scientist, CMA, am acting as transcriptionist for Briscoe Deutscher, DO  I have reviewed the above documentation for accuracy and completeness, and I agree with the above. Briscoe Deutscher, DO

## 2020-04-21 DIAGNOSIS — Z01419 Encounter for gynecological examination (general) (routine) without abnormal findings: Secondary | ICD-10-CM | POA: Diagnosis not present

## 2020-04-21 DIAGNOSIS — Z1231 Encounter for screening mammogram for malignant neoplasm of breast: Secondary | ICD-10-CM | POA: Diagnosis not present

## 2020-04-21 DIAGNOSIS — Z6837 Body mass index (BMI) 37.0-37.9, adult: Secondary | ICD-10-CM | POA: Diagnosis not present

## 2020-04-21 LAB — HM MAMMOGRAPHY

## 2020-05-04 ENCOUNTER — Other Ambulatory Visit: Payer: Self-pay

## 2020-05-04 ENCOUNTER — Ambulatory Visit (AMBULATORY_SURGERY_CENTER): Payer: Self-pay

## 2020-05-04 VITALS — Ht 62.0 in | Wt 205.0 lb

## 2020-05-04 DIAGNOSIS — Z1211 Encounter for screening for malignant neoplasm of colon: Secondary | ICD-10-CM

## 2020-05-04 MED ORDER — NA SULFATE-K SULFATE-MG SULF 17.5-3.13-1.6 GM/177ML PO SOLN: 1.0000 | Freq: Once | ORAL | 0 refills | Status: AC

## 2020-05-04 NOTE — Progress Notes (Signed)
No egg or soy allergy known to patient  No issues with past sedation with any surgeries or procedures no intubation problems in the past  No FH of Malignant Hyperthermia No diet pills per patient No home 02 use per patient  No blood thinners per patient  Pt denies issues with constipation  No A fib or A flutter  EMMI video to pt or via MyChart  COVID 19 guidelines implemented in [facility] today with Pt and RN   Suprep Coupon given to pt in [facility] today , Code to Pharmacy   Due to the COVID-19 pandemic we are asking patients to follow these guidelines. Please only bring one care partner. Please be aware that your care partner may wait in the car in the parking lot or if they feel like they will be too hot to wait in the car, they may wait in the lobby on the 4th floor. All care partners are required to wear a mask the entire time (we do not have any that we can provide them), they need to practice social distancing, and we will do a Covid check for all patient's and care partners when you arrive. Also we will check their temperature and your temperature. If the care partner waits in their car they need to stay in the parking lot the entire time and we will call them on their cell phone when the patient is ready for discharge so they can bring the car to the front of the building. Also all patient's will need to wear a mask into building.  

## 2020-05-11 ENCOUNTER — Ambulatory Visit (INDEPENDENT_AMBULATORY_CARE_PROVIDER_SITE_OTHER): Payer: BC Managed Care – PPO

## 2020-05-11 ENCOUNTER — Other Ambulatory Visit: Payer: Self-pay

## 2020-05-11 ENCOUNTER — Ambulatory Visit (INDEPENDENT_AMBULATORY_CARE_PROVIDER_SITE_OTHER): Payer: BC Managed Care – PPO | Admitting: Family Medicine

## 2020-05-11 ENCOUNTER — Encounter (INDEPENDENT_AMBULATORY_CARE_PROVIDER_SITE_OTHER): Payer: Self-pay | Admitting: Family Medicine

## 2020-05-11 VITALS — BP 119/87 | HR 65 | Temp 98.2°F | Ht 62.0 in | Wt 198.0 lb

## 2020-05-11 DIAGNOSIS — F3289 Other specified depressive episodes: Secondary | ICD-10-CM

## 2020-05-11 DIAGNOSIS — R7303 Prediabetes: Secondary | ICD-10-CM | POA: Diagnosis not present

## 2020-05-11 DIAGNOSIS — Z6836 Body mass index (BMI) 36.0-36.9, adult: Secondary | ICD-10-CM

## 2020-05-11 DIAGNOSIS — E559 Vitamin D deficiency, unspecified: Secondary | ICD-10-CM

## 2020-05-11 DIAGNOSIS — Z23 Encounter for immunization: Secondary | ICD-10-CM

## 2020-05-11 MED ORDER — BUPROPION HCL ER (SR) 150 MG PO TB12: 150.0000 mg | ORAL_TABLET | Freq: Every day | ORAL | 0 refills | Status: DC

## 2020-05-17 NOTE — Progress Notes (Signed)
Chief Complaint:   OBESITY Donna Mendoza is here to discuss her progress with her obesity treatment plan along with follow-up of her obesity related diagnoses.   Today's visit was #: 13 Starting weight: 224 lbs Starting date: 08/03/2019 Today's weight: 198 lbs Today's date: 05/11/2020 Total lbs lost to date: 26 lbs Body mass index is 36.21 kg/m.  Total weight loss percentage to date: -11.61%  Interim History: Donna Mendoza says this is her last weekend of 18 hour shifts!  She will start journaling.  She says she will be going home for Christmas.  Her goal is to get to 135 pounds by the time she goes on her trip.  Nutrition Plan: Category 2 plan for 40% of the time. Anti-obesity medications: Wellbutrin, Metformin. Reported side effects: None. Hunger is moderately controlled controlled. Cravings are moderately controlled controlled.  Activity: Walking 2.5 miles 3-4 times per week. Sleep is not restful.   Assessment/Plan:   1. Prediabetes Goal is HgbA1c < 5.7 and insulin level closer to 5.  She is taking metformin 500 mg twice daily.  No side effects.  Plan:  Continue metformin 500 mg twice daily.  Lab Results  Component Value Date   HGBA1C 5.7 (H) 02/28/2020   2. Vitamin D deficiency Current vitamin D is 49.8, tested on 02/28/2020. Improving, but not optimized. Optimal goal > 50 ng/dL.   Plan:  []   Continue Vitamin D @50 ,000 IU every week. [x]   Continue home supplement daily. [x]   Follow-up for routine testing of Vitamin D at least 2-3 times per year to avoid over-replacement. []   Monitored by PCP.  3. Other depression, with emotional eating  Donna Mendoza is taking bupropion 150 mg SR daily.  Denies SI.  Plan:  Continue Wellbutrin.  Will refill today, as per below.  - Refill buPROPion (WELLBUTRIN SR) 150 MG 12 hr tablet; Take 1 tablet (150 mg total) by mouth daily.  Dispense: 30 tablet; Refill: 0  4. Class 2 severe obesity with serious comorbidity and body mass index (BMI) of  36.0 to 36.9 in adult, unspecified obesity type [facility])  Donna Mendoza is currently in the action stage of change. As such, her goal is to continue with weight loss efforts.   Nutrition goals: She has agreed to the Category 2 Plan.   Exercise goals: For substantial health benefits, adults should do at least 150 minutes (2 hours and 30 minutes) a week of moderate-intensity, or 75 minutes (1 hour and 15 minutes) a week of vigorous-intensity aerobic physical activity, or an equivalent combination of moderate- and vigorous-intensity aerobic activity. Aerobic activity should be performed in episodes of at least 10 minutes, and preferably, it should be spread throughout the week.  Behavioral modification strategies: increasing lean protein intake, decreasing simple carbohydrates, increasing vegetables, increasing water intake and decreasing liquid calories.  Donna Mendoza has agreed to follow-up with our clinic in 3 weeks. She was informed of the importance of frequent follow-up visits to maximize her success with intensive lifestyle modifications for her multiple health conditions.   Objective:   Blood pressure 119/87, pulse 65, temperature 98.2 F (36.8 C), temperature source Oral, height 5\' 2"  (1.575 m), weight 198 lb (89.8 kg), SpO2 97 %. Body mass index is 36.21 kg/m.  General: Cooperative, alert, well developed, in no acute distress. HEENT: Conjunctivae and lids unremarkable. Cardiovascular: Regular rhythm.  Lungs: Normal work of breathing. Neurologic: No focal deficits.   Lab Results  Component Value Date   CREATININE 0.75 02/28/2020   BUN 17 02/28/2020  NA 143 02/28/2020   K 4.5 02/28/2020   CL 106 02/28/2020   CO2 26 02/28/2020   Lab Results  Component Value Date   ALT 28 02/28/2020   AST 28 02/28/2020   ALKPHOS 87 02/28/2020   BILITOT 0.5 02/28/2020   Lab Results  Component Value Date   HGBA1C 5.7 (H) 02/28/2020   HGBA1C 5.7 (H) 10/18/2019   HGBA1C 6.0 07/05/2019   HGBA1C 5.9  05/27/2018   HGBA1C 5.6 04/02/2017   Lab Results  Component Value Date   TSH 1.55 05/27/2018   Lab Results  Component Value Date   CHOL 203 (H) 02/28/2020   HDL 59 02/28/2020   LDLCALC 128 (H) 02/28/2020   TRIG 88 02/28/2020   CHOLHDL 3 07/05/2019   Lab Results  Component Value Date   WBC 6.0 02/28/2020   HGB 13.9 02/28/2020   HCT 44.0 02/28/2020   MCV 91 02/28/2020   PLT 297 02/28/2020   Attestation Statements:   Reviewed by clinician on day of visit: allergies, medications, problem list, medical history, surgical history, family history, social history, and previous encounter notes.  I, Water quality scientist, CMA, am acting as transcriptionist for Briscoe Deutscher, DO  I have reviewed the above documentation for accuracy and completeness, and I agree with the above. Briscoe Deutscher, DO

## 2020-05-18 ENCOUNTER — Other Ambulatory Visit: Payer: Self-pay

## 2020-05-18 ENCOUNTER — Ambulatory Visit (AMBULATORY_SURGERY_CENTER): Payer: BC Managed Care – PPO | Admitting: Gastroenterology

## 2020-05-18 ENCOUNTER — Encounter: Payer: Self-pay | Admitting: Gastroenterology

## 2020-05-18 VITALS — BP 112/64 | HR 70 | Temp 97.7°F | Resp 14 | Ht 62.0 in | Wt 205.0 lb

## 2020-05-18 DIAGNOSIS — Z1211 Encounter for screening for malignant neoplasm of colon: Secondary | ICD-10-CM | POA: Diagnosis not present

## 2020-05-18 MED ORDER — SODIUM CHLORIDE 0.9 % IV SOLN: 500.0000 mL | Freq: Once | INTRAVENOUS | Status: DC

## 2020-05-18 NOTE — Patient Instructions (Signed)
Handouts on hemorrhoids and diverticulosis given to you today  YOU HAD AN ENDOSCOPIC PROCEDURE TODAY AT THE [facility]:   Refer to the procedure report that was given to you for any specific questions about what was found during the examination.  If the procedure report does not answer your questions, please call your gastroenterologist to clarify.  If you requested that your care partner not be given the details of your procedure findings, then the procedure report has been included in a sealed envelope for you to review at your convenience later.  YOU SHOULD EXPECT: Some feelings of bloating in the abdomen. Passage of more gas than usual.  Walking can help get rid of the air that was put into your GI tract during the procedure and reduce the bloating. If you had a lower endoscopy (such as a colonoscopy or flexible sigmoidoscopy) you may notice spotting of blood in your stool or on the toilet paper. If you underwent a bowel prep for your procedure, you may not have a normal bowel movement for a few days.  Please Note:  You might notice some irritation and congestion in your nose or some drainage.  This is from the oxygen used during your procedure.  There is no need for concern and it should clear up in a day or so.  SYMPTOMS TO REPORT IMMEDIATELY:  Following lower endoscopy (colonoscopy or flexible sigmoidoscopy):  Excessive amounts of blood in the stool  Significant tenderness or worsening of abdominal pains  Swelling of the abdomen that is new, acute  Fever of 100F or higher  For urgent or emergent issues, a gastroenterologist can be reached at any hour by calling [phone]. Do not use MyChart messaging for urgent concerns.    DIET:  We do recommend a small meal at first, but then you may proceed to your regular diet.  Drink plenty of fluids but you should avoid alcoholic beverages for 24 hours.  ACTIVITY:  You should plan to take it easy for the rest of today and you  should NOT DRIVE or use heavy machinery until tomorrow (because of the sedation medicines used during the test).    FOLLOW UP: Our staff will call the number listed on your records 48-72 hours following your procedure to check on you and address any questions or concerns that you may have regarding the information given to you following your procedure. If we do not reach you, we will leave a message.  We will attempt to reach you two times.  During this call, we will ask if you have developed any symptoms of COVID 19. If you develop any symptoms (ie: fever, flu-like symptoms, shortness of breath, cough etc.) before then, please call [phone].  If you test positive for Covid 19 in the 2 weeks post procedure, please call and report this information to us.     SIGNATURES/CONFIDENTIALITY: You and/or your care partner have signed paperwork which will be entered into your electronic medical record.  These signatures attest to the fact that that the information above on your After Visit Summary has been reviewed and is understood.  Full responsibility of the confidentiality of this discharge information lies with you and/or your care-partner.  

## 2020-05-18 NOTE — Progress Notes (Signed)
Pt's states no medical or surgical changes since previsit or office visit. 

## 2020-05-18 NOTE — Progress Notes (Signed)
Report to PACU, RN, vss, BBS= Clear.  

## 2020-05-18 NOTE — Op Note (Signed)
[facility] Patient Name: Donna Mendoza Procedure Date: 05/18/2020 8:36 AM MRN: [id number] Endoscopist: Mauri Pole , MD Age: 49 Referring MD:  Date of Birth: 12-24-1970 Gender: Female Account #: [id number] Procedure:                Colonoscopy Indications:              Screening for colorectal malignant neoplasm Medicines:                Monitored Anesthesia Care Procedure:                Pre-Anesthesia Assessment:                           - Prior to the procedure, a History and Physical                            was performed, and patient medications and                            allergies were reviewed. The patient's tolerance of                            previous anesthesia was also reviewed. The risks                            and benefits of the procedure and the sedation                            options and risks were discussed with the patient.                            All questions were answered, and informed consent                            was obtained. Prior Anticoagulants: The patient has                            taken no previous anticoagulant or antiplatelet                            agents. ASA Grade Assessment: II - A patient with                            mild systemic disease. After reviewing the risks                            and benefits, the patient was deemed in                            satisfactory condition to undergo the procedure.                           After obtaining informed consent, the colonoscope  was passed under direct vision. Throughout the                            procedure, the patient's blood pressure, pulse, and                            oxygen saturations were monitored continuously. The                            Colonoscope was introduced through the anus and                            advanced to the the cecum, identified by                            appendiceal  orifice and ileocecal valve. The                            colonoscopy was performed without difficulty. The                            patient tolerated the procedure well. The quality                            of the bowel preparation was excellent. The                            ileocecal valve, appendiceal orifice, and rectum                            were photographed. Scope In: 8:46:02 AM Scope Out: 8:59:25 AM Scope Withdrawal Time: 0 hours 10 minutes 31 seconds  Total Procedure Duration: 0 hours 13 minutes 23 seconds  Findings:                 The perianal and digital rectal examinations were                            normal.                           A few small-mouthed diverticula were found in the                            sigmoid colon and descending colon.                           Non-bleeding internal hemorrhoids were found during                            retroflexion. The hemorrhoids were medium-sized.                           The exam was otherwise without abnormality. Complications:            No immediate complications. Estimated Blood Loss:  Estimated blood loss was minimal. Impression:               - Diverticulosis in the sigmoid colon and in the                            descending colon.                           - Non-bleeding internal hemorrhoids.                           - The examination was otherwise normal.                           - No specimens collected. Recommendation:           - Patient has a contact number available for                            emergencies. The signs and symptoms of potential                            delayed complications were discussed with the                            patient. Return to normal activities tomorrow.                            Written discharge instructions were provided to the                            patient.                           - Resume previous diet.                           - Continue  present medications.                           - Repeat colonoscopy in 10 years for screening                            purposes. Mauri Pole, MD 05/18/2020 9:02:46 AM This report has been signed electronically.

## 2020-05-22 ENCOUNTER — Telehealth: Payer: Self-pay | Admitting: *Deleted

## 2020-05-22 ENCOUNTER — Telehealth: Payer: Self-pay

## 2020-05-22 NOTE — Telephone Encounter (Signed)
First attempt follow up call to pt, lm on vm 

## 2020-05-22 NOTE — Telephone Encounter (Signed)
Second follow up call made. Left message.

## 2020-06-02 ENCOUNTER — Other Ambulatory Visit (INDEPENDENT_AMBULATORY_CARE_PROVIDER_SITE_OTHER): Payer: Self-pay | Admitting: Family Medicine

## 2020-06-02 DIAGNOSIS — F3289 Other specified depressive episodes: Secondary | ICD-10-CM

## 2020-06-05 ENCOUNTER — Ambulatory Visit (INDEPENDENT_AMBULATORY_CARE_PROVIDER_SITE_OTHER): Payer: BC Managed Care – PPO | Admitting: Family Medicine

## 2020-06-05 ENCOUNTER — Encounter (INDEPENDENT_AMBULATORY_CARE_PROVIDER_SITE_OTHER): Payer: Self-pay | Admitting: Family Medicine

## 2020-06-05 ENCOUNTER — Other Ambulatory Visit: Payer: Self-pay

## 2020-06-05 VITALS — BP 112/79 | HR 68 | Temp 98.1°F | Ht 62.0 in | Wt 194.0 lb

## 2020-06-05 DIAGNOSIS — E559 Vitamin D deficiency, unspecified: Secondary | ICD-10-CM | POA: Diagnosis not present

## 2020-06-05 DIAGNOSIS — R7303 Prediabetes: Secondary | ICD-10-CM | POA: Diagnosis not present

## 2020-06-05 DIAGNOSIS — F3289 Other specified depressive episodes: Secondary | ICD-10-CM | POA: Diagnosis not present

## 2020-06-05 DIAGNOSIS — Z6835 Body mass index (BMI) 35.0-35.9, adult: Secondary | ICD-10-CM

## 2020-06-05 MED ORDER — BUPROPION HCL ER (SR) 150 MG PO TB12: 150.0000 mg | ORAL_TABLET | Freq: Every day | ORAL | 0 refills | Status: DC

## 2020-06-05 MED ORDER — METFORMIN HCL 500 MG PO TABS: 500.0000 mg | ORAL_TABLET | Freq: Two times a day (BID) | ORAL | 0 refills | Status: DC

## 2020-06-05 NOTE — Progress Notes (Signed)
Chief Complaint:   OBESITY Rani is here to discuss her progress with her obesity treatment plan along with follow-up of her obesity related diagnoses.   Today's visit was #: 14 Starting weight: 224 lbs Starting date: 08/03/2019 Today's weight: 194 lbs Today's date: 06/05/2020 Total lbs lost to date: 30 lbs Body mass index is 35.48 kg/m.  Total weight loss percentage to date: -13.39%  Interim History: Drina says she has had a little bit of respite, but work is about to be busy again until February.  She will be visiting family for Christmas and is very excited about this. Her father-in-law is still a source of stress.  Nutrition Plan: the Category 2 Plan.  Anti-obesity medications: metformin 500 mg twice daily. Reported side effects: None. Hunger is well controlled controlled. Cravings are well controlled controlled.  Activity: No dedicated exercise.   Assessment/Plan:   1. Prediabetes Improving, but not optimized. Goal is HgbA1c < 5.7.  Medication: metformin 500 mg twice daily.  She will continue to focus on protein-rich, low simple carbohydrate foods. We reviewed the importance of hydration, regular exercise for stress reduction, and restorative sleep.   Lab Results  Component Value Date   HGBA1C 5.7 (H) 02/28/2020   - Refill metFORMIN (GLUCOPHAGE) 500 MG tablet; Take 1 tablet (500 mg total) by mouth 2 (two) times daily with a meal.  Dispense: 180 tablet; Refill: 0  2. Vitamin D deficiency Optimized. Current vitamin D is 49.8, tested on 02/28/2020. Taking OTC vitamin D 1,000 IU daily.  Plan:  []   Continue Vitamin D @50 ,000 IU every week. [x]   Continue home supplement daily. [x]   Follow-up for routine testing of Vitamin D at least 2-3 times per year to avoid over-replacement.  3. Other depression, with emotional eating  Improving, but not optimized. Medication: Wellbutrin. Motivational interviewing as well as evidence-based interventions for health behavior  change were utilized today including the discussion of self monitoring techniques, problem-solving barriers and SMART goal setting techniques.   - Refill buPROPion (WELLBUTRIN SR) 150 MG 12 hr tablet; Take 1 tablet (150 mg total) by mouth daily.  Dispense: 90 tablet; Refill: 0  4. Class 2 severe obesity with serious comorbidity and body mass index (BMI) of 35.0 to 35.9 in adult, unspecified obesity type ([place])  Course: Esme is currently in the action stage of change. As such, her goal is to continue with weight loss efforts.   Nutrition goals: She has agreed to the Category 2 Plan.   Exercise goals: For substantial health benefits, adults should do at least 150 minutes (2 hours and 30 minutes) a week of moderate-intensity, or 75 minutes (1 hour and 15 minutes) a week of vigorous-intensity aerobic physical activity, or an equivalent combination of moderate- and vigorous-intensity aerobic activity. Aerobic activity should be performed in episodes of at least 10 minutes, and preferably, it should be spread throughout the week.  Behavioral modification strategies: increasing lean protein intake, decreasing simple carbohydrates, increasing vegetables, increasing water intake and holiday eating strategies (going to [place], December 20-30).  Dulcie has agreed to follow-up with our clinic in 3-4 weeks. She was informed of the importance of frequent follow-up visits to maximize her success with intensive lifestyle modifications for her multiple health conditions.   Objective:   Blood pressure 112/79, pulse 68, temperature 98.1 F (36.7 C), height 5\' 2"  (1.575 m), weight 194 lb (88 kg), SpO2 97 %. Body mass index is 35.48 kg/m.  General: Cooperative, alert, well developed, in no acute  distress. HEENT: Conjunctivae and lids unremarkable. Cardiovascular: Regular rhythm.  Lungs: Normal work of breathing. Neurologic: No focal deficits.   Lab Results  Component Value Date   CREATININE 0.75  02/28/2020   BUN 17 02/28/2020   NA 143 02/28/2020   K 4.5 02/28/2020   CL 106 02/28/2020   CO2 26 02/28/2020   Lab Results  Component Value Date   ALT 28 02/28/2020   AST 28 02/28/2020   ALKPHOS 87 02/28/2020   BILITOT 0.5 02/28/2020   Lab Results  Component Value Date   HGBA1C 5.7 (H) 02/28/2020   HGBA1C 5.7 (H) 10/18/2019   HGBA1C 6.0 07/05/2019   HGBA1C 5.9 05/27/2018   HGBA1C 5.6 04/02/2017   Lab Results  Component Value Date   TSH 1.55 05/27/2018   Lab Results  Component Value Date   CHOL 203 (H) 02/28/2020   HDL 59 02/28/2020   LDLCALC 128 (H) 02/28/2020   TRIG 88 02/28/2020   CHOLHDL 3 07/05/2019   Lab Results  Component Value Date   WBC 6.0 02/28/2020   HGB 13.9 02/28/2020   HCT 44.0 02/28/2020   MCV 91 02/28/2020   PLT 297 02/28/2020   Attestation Statements:   Reviewed by clinician on day of visit: allergies, medications, problem list, medical history, surgical history, family history, social history, and previous encounter notes.  I, Water quality scientist, CMA, am acting as transcriptionist for Briscoe Deutscher, DO  I have reviewed the above documentation for accuracy and completeness, and I agree with the above. Briscoe Deutscher, DO

## 2020-06-08 ENCOUNTER — Other Ambulatory Visit: Payer: Self-pay

## 2020-06-08 ENCOUNTER — Encounter (INDEPENDENT_AMBULATORY_CARE_PROVIDER_SITE_OTHER): Payer: Self-pay | Admitting: Family Medicine

## 2020-06-08 ENCOUNTER — Ambulatory Visit (INDEPENDENT_AMBULATORY_CARE_PROVIDER_SITE_OTHER): Payer: BC Managed Care – PPO | Admitting: Physician Assistant

## 2020-06-08 ENCOUNTER — Encounter: Payer: Self-pay | Admitting: Physician Assistant

## 2020-06-08 VITALS — BP 118/78 | HR 69 | Temp 98.3°F | Resp 16 | Ht 62.0 in | Wt 199.8 lb

## 2020-06-08 DIAGNOSIS — F3289 Other specified depressive episodes: Secondary | ICD-10-CM

## 2020-06-08 DIAGNOSIS — Z1159 Encounter for screening for other viral diseases: Secondary | ICD-10-CM | POA: Diagnosis not present

## 2020-06-08 DIAGNOSIS — Z23 Encounter for immunization: Secondary | ICD-10-CM

## 2020-06-08 DIAGNOSIS — E669 Obesity, unspecified: Secondary | ICD-10-CM | POA: Diagnosis not present

## 2020-06-08 DIAGNOSIS — D1722 Benign lipomatous neoplasm of skin and subcutaneous tissue of left arm: Secondary | ICD-10-CM

## 2020-06-08 DIAGNOSIS — Z0000 Encounter for general adult medical examination without abnormal findings: Secondary | ICD-10-CM | POA: Diagnosis not present

## 2020-06-08 DIAGNOSIS — Z6836 Body mass index (BMI) 36.0-36.9, adult: Secondary | ICD-10-CM

## 2020-06-08 DIAGNOSIS — R7303 Prediabetes: Secondary | ICD-10-CM

## 2020-06-08 LAB — COMPREHENSIVE METABOLIC PANEL
ALT: 21 U/L (ref 0–35)
AST: 21 U/L (ref 0–37)
Albumin: 3.9 g/dL (ref 3.5–5.2)
Alkaline Phosphatase: 69 U/L (ref 39–117)
BUN: 11 mg/dL (ref 6–23)
CO2: 31 mEq/L (ref 19–32)
Calcium: 9 mg/dL (ref 8.4–10.5)
Chloride: 105 mEq/L (ref 96–112)
Creatinine, Ser: 0.76 mg/dL (ref 0.40–1.20)
GFR: 91.4 mL/min (ref >60.00)
Glucose, Bld: 96 mg/dL (ref 70–99)
Potassium: 4.1 mEq/L (ref 3.5–5.1)
Sodium: 140 mEq/L (ref 135–145)
Total Bilirubin: 0.5 mg/dL (ref 0.2–1.2)
Total Protein: 6.3 g/dL (ref 6.0–8.3)

## 2020-06-08 LAB — LIPID PANEL
Cholesterol: 143 mg/dL (ref 0–200)
HDL: 48.6 mg/dL (ref >39.00)
LDL Cholesterol: 76 mg/dL (ref 0–99)
NonHDL: 94.12
Total CHOL/HDL Ratio: 3
Triglycerides: 90 mg/dL (ref 0.0–149.0)
VLDL: 18 mg/dL (ref 0.0–40.0)

## 2020-06-08 LAB — TSH: TSH: 1.58 u[IU]/mL (ref 0.35–4.50)

## 2020-06-08 LAB — CBC WITH DIFFERENTIAL/PLATELET
Basophils Absolute: 0 10*3/uL (ref 0.0–0.1)
Basophils Relative: 0.5 % (ref 0.0–3.0)
Eosinophils Absolute: 0.2 10*3/uL (ref 0.0–0.7)
Eosinophils Relative: 2.7 % (ref 0.0–5.0)
HCT: 42.9 % (ref 36.0–46.0)
Hemoglobin: 14.2 g/dL (ref 12.0–15.0)
Lymphocytes Relative: 16.8 % (ref 12.0–46.0)
Lymphs Abs: 1 10*3/uL (ref 0.7–4.0)
MCHC: 33.1 g/dL (ref 30.0–36.0)
MCV: 86.6 fl (ref 78.0–100.0)
Monocytes Absolute: 0.5 10*3/uL (ref 0.1–1.0)
Monocytes Relative: 8.4 % (ref 3.0–12.0)
Neutro Abs: 4.2 10*3/uL (ref 1.4–7.7)
Neutrophils Relative %: 71.6 % (ref 43.0–77.0)
Platelets: 260 10*3/uL (ref 150.0–400.0)
RBC: 4.96 Mil/uL (ref 3.87–5.11)
RDW: 15 % (ref 11.5–15.5)
WBC: 5.9 10*3/uL (ref 4.0–10.5)

## 2020-06-08 LAB — HEMOGLOBIN A1C: Hgb A1c MFr Bld: 5.7 % (ref 4.6–6.5)

## 2020-06-08 NOTE — Telephone Encounter (Signed)
Dr Wallace pt °

## 2020-06-08 NOTE — Addendum Note (Signed)
Addended by: Genevie Cheshire L on: 06/08/2020 09:58 AM   Modules accepted: Orders

## 2020-06-08 NOTE — Patient Instructions (Signed)
Please go to the lab for blood work.   Our office will call you with your results unless you have chosen to receive results via MyChart.  If your blood work is normal we will follow-up each year for physicals and as scheduled for chronic medical problems.  If anything is abnormal we will treat accordingly and get you in for a follow-up.  You will be contacted by Dermatology for evaluation and removal of cyst/lipoma. If you do not hear from them within 10 days, let me know.    Preventive Care 50-25 Years Old, Female Preventive care refers to visits with your health care provider and lifestyle choices that can promote health and wellness. This includes:  A yearly physical exam. This may also be called an annual well check.  Regular dental visits and eye exams.  Immunizations.  Screening for certain conditions.  Healthy lifestyle choices, such as eating a healthy diet, getting regular exercise, not using drugs or products that contain nicotine and tobacco, and limiting alcohol use. What can I expect for my preventive care visit? Physical exam Your health care provider will check your:  Height and weight. This may be used to calculate body mass index (BMI), which tells if you are at a healthy weight.  Heart rate and blood pressure.  Skin for abnormal spots. Counseling Your health care provider may ask you questions about your:  Alcohol, tobacco, and drug use.  Emotional well-being.  Home and relationship well-being.  Sexual activity.  Eating habits.  Work and work Statistician.  Method of birth control.  Menstrual cycle.  Pregnancy history. What immunizations do I need?  Influenza (flu) vaccine  This is recommended every year. Tetanus, diphtheria, and pertussis (Tdap) vaccine  You may need a Td booster every 10 years. Varicella (chickenpox) vaccine  You may need this if you have not been vaccinated. Zoster (shingles) vaccine  You may need this after age  50. Measles, mumps, and rubella (MMR) vaccine  You may need at least one dose of MMR if you were born in 1957 or later. You may also need a second dose. Pneumococcal conjugate (PCV13) vaccine  You may need this if you have certain conditions and were not previously vaccinated. Pneumococcal polysaccharide (PPSV23) vaccine  You may need one or two doses if you smoke cigarettes or if you have certain conditions. Meningococcal conjugate (MenACWY) vaccine  You may need this if you have certain conditions. Hepatitis A vaccine  You may need this if you have certain conditions or if you travel or work in places where you may be exposed to hepatitis A. Hepatitis B vaccine  You may need this if you have certain conditions or if you travel or work in places where you may be exposed to hepatitis B. Haemophilus influenzae type b (Hib) vaccine  You may need this if you have certain conditions. Human papillomavirus (HPV) vaccine  If recommended by your health care provider, you may need three doses over 6 months. You may receive vaccines as individual doses or as more than one vaccine together in one shot (combination vaccines). Talk with your health care provider about the risks and benefits of combination vaccines. What tests do I need? Blood tests  Lipid and cholesterol levels. These may be checked every 5 years, or more frequently if you are over 76 years old.  Hepatitis C test.  Hepatitis B test. Screening  Lung cancer screening. You may have this screening every year starting at age 74 if you have  a 30-pack-year history of smoking and currently smoke or have quit within the past 15 years.  Colorectal cancer screening. All adults should have this screening starting at age 76 and continuing until age 13. Your health care provider may recommend screening at age 28 if you are at increased risk. You will have tests every 1-10 years, depending on your results and the type of screening  test.  Diabetes screening. This is done by checking your blood sugar (glucose) after you have not eaten for a while (fasting). You may have this done every 1-3 years.  Mammogram. This may be done every 1-2 years. Talk with your health care provider about when you should start having regular mammograms. This may depend on whether you have a family history of breast cancer.  BRCA-related cancer screening. This may be done if you have a family history of breast, ovarian, tubal, or peritoneal cancers.  Pelvic exam and Pap test. This may be done every 3 years starting at age 62. Starting at age 51, this may be done every 5 years if you have a Pap test in combination with an HPV test. Other tests  Sexually transmitted disease (STD) testing.  Bone density scan. This is done to screen for osteoporosis. You may have this scan if you are at high risk for osteoporosis. Follow these instructions at home: Eating and drinking  Eat a diet that includes fresh fruits and vegetables, whole grains, lean protein, and low-fat dairy.  Take vitamin and mineral supplements as recommended by your health care provider.  Do not drink alcohol if: ? Your health care provider tells you not to drink. ? You are pregnant, may be pregnant, or are planning to become pregnant.  If you drink alcohol: ? Limit how much you have to 0-1 drink a day. ? Be aware of how much alcohol is in your drink. In the [place] one drink equals one 12 oz bottle of beer (355 mL), one 5 oz glass of wine (148 mL), or one 1 oz glass of hard liquor (44 mL). Lifestyle  Take daily care of your teeth and gums.  Stay active. Exercise for at least 30 minutes on 5 or more days each week.  Do not use any products that contain nicotine or tobacco, such as cigarettes, e-cigarettes, and chewing tobacco. If you need help quitting, ask your health care provider.  If you are sexually active, practice safe sex. Use a condom or other form of birth control  (contraception) in order to prevent pregnancy and STIs (sexually transmitted infections).  If told by your health care provider, take low-dose aspirin daily starting at age 21. What's next?  Visit your health care provider once a year for a well check visit.  Ask your health care provider how often you should have your eyes and teeth checked.  Stay up to date on all vaccines. This information is not intended to replace advice given to you by your health care provider. Make sure you discuss any questions you have with your health care provider. Document Revised: 03/19/2018 Document Reviewed: 03/19/2018 Elsevier Patient Education  [employer]. .

## 2020-06-08 NOTE — Progress Notes (Signed)
Patient presents to clinic today for annual exam.  Patient is fasting for labs.  Acute Concerns: Patient endorses cyst of her left anterior bicep region. Has been noticed by others. Would like removed.   Chronic Issues: Obesity -- Currently followed by [facility] (Dr. Juleen China). On Metformin for insulin resistance. Is following dietary and exercise recommendations. Had cholesterol checked a few months ago. Would like rechecked today. Marland Kitchen   Health Maintenance: Immunizations -- Due for second shingrix. Colon Cancer Screening -- up-to-date Mammogram -- Up-to-date.  Agrees to Hep C screen today.   Past Medical History:  Diagnosis Date   Allergy    seasonal   Gallbladder problem    GERD (gastroesophageal reflux disease)    Heartburn    History of chicken pox    Venous thrombosis of leg    Left   Vitamin D deficiency     Past Surgical History:  Procedure Laterality Date   BREAST SURGERY     Fibroidadenoma-Left   CHOLECYSTECTOMY     Neck Fusion  06/2016   REFRACTIVE SURGERY     Bilateral   ruptured disc     TONSILLECTOMY     WISDOM TOOTH EXTRACTION      Current Outpatient Medications on File Prior to Visit  Medication Sig Dispense Refill   aspirin-acetaminophen-caffeine (EXCEDRIN MIGRAINE) 250-250-65 MG tablet Take by mouth every 6 (six) hours as needed for headache.     buPROPion (WELLBUTRIN SR) 150 MG 12 hr tablet Take 1 tablet (150 mg total) by mouth daily. 90 tablet 0   CAMILA 0.35 MG tablet Take 1 tablet by mouth daily.  3   Cholecalciferol (VITAMIN D3) 25 MCG (1000 UT) CAPS Take 1,000 Int'l Units by mouth.     clobetasol ointment (TEMOVATE) 0.05 %      Crisaborole (EUCRISA) 2 % OINT Apply 1 application topically daily. 60 g 1   Glucosamine-Chondroit-Vit C-Mn (GLUCOSAMINE 1500 COMPLEX PO) Take 1,500 mg by mouth.     hydrocortisone 2.5 % cream Apply 1 application topically 2 (two) times daily as needed. 30 g 3   ibuprofen  (ADVIL) 800 MG tablet Take 1 tablet (800 mg total) by mouth every 8 (eight) hours as needed. 30 tablet 0   ketoconazole (NIZORAL) 2 % cream Apply 1 application topically daily. 15 g 0   levocetirizine (XYZAL) 5 MG tablet Take 5 mg by mouth every evening.     metFORMIN (GLUCOPHAGE) 500 MG tablet Take 1 tablet (500 mg total) by mouth 2 (two) times daily with a meal. 180 tablet 0   Multiple Vitamin (MULTIVITAMIN ADULT PO) Take by mouth.     pantoprazole (PROTONIX) 40 MG tablet Take 1 tablet (40 mg total) by mouth every other day. 90 tablet 0   Turmeric Curcumin 500 MG CAPS Take 500 mg by mouth.     No current facility-administered medications on file prior to visit.    Allergies  Allergen Reactions   Amoxil [Amoxicillin] Hives   Naproxen Rash    Family History  Problem Relation Age of Onset   Brain cancer Mother 61       Deceased-Tumor   Obesity Mother    Glaucoma Maternal Grandfather    Glaucoma Maternal Aunt    Cancer Maternal Grandmother    Lung cancer Maternal Aunt    Healthy Sister        x1   Colon cancer Neg Hx    Colon polyps Neg Hx    Esophageal cancer  Neg Hx    Stomach cancer Neg Hx    Rectal cancer Neg Hx     Social History   Socioeconomic History   Marital status: Single    Spouse name: Not on file   Number of children: Not on file   Years of education: Not on file   Highest education level: Not on file  Occupational History   Not on file  Tobacco Use   Smoking status: Never Smoker   Smokeless tobacco: Never Used  Vaping Use   Vaping Use: Never used  Substance and Sexual Activity   Alcohol use: Yes    Alcohol/week: 0.0 standard drinks    Comment: rare   Drug use: No   Sexual activity: Yes    Partners: Male    Birth control/protection: Pill    Comment: boyfriend  Other Topics Concern   Not on file  Social History Narrative   Not on file   Social Determinants of Health   Financial Resource Strain:     Difficulty of Paying Living Expenses: Not on file  Food Insecurity:    Worried About Charity fundraiser in the Last Year: Not on file   [employer] of Food in the Last Year: Not on file  Transportation Needs:    Lack of Transportation (Medical): Not on file   Lack of Transportation (Non-Medical): Not on file  Physical Activity:    Days of Exercise per Week: Not on file   Minutes of Exercise per Session: Not on file  Stress:    Feeling of Stress : Not on file  Social Connections:    Frequency of Communication with Friends and Family: Not on file   Frequency of Social Gatherings with Friends and Family: Not on file   Attends Religious Services: Not on file   Active Member of Clubs or Organizations: Not on file   Attends Archivist Meetings: Not on file   Marital Status: Not on file  Intimate Partner Violence:    Fear of Current or Ex-Partner: Not on file   Emotionally Abused: Not on file   Physically Abused: Not on file   Sexually Abused: Not on file   Review of Systems  Constitutional: Negative for fever and weight loss.  HENT: Negative for ear discharge, ear pain, hearing loss and tinnitus.   Eyes: Negative for blurred vision, double vision, photophobia and pain.  Respiratory: Negative for cough and shortness of breath.   Cardiovascular: Negative for chest pain and palpitations.  Gastrointestinal: Negative for abdominal pain, blood in stool, constipation, diarrhea, heartburn, melena, nausea and vomiting.  Genitourinary: Negative for dysuria, flank pain, frequency, hematuria and urgency.  Musculoskeletal: Negative for falls.  Neurological: Negative for dizziness, loss of consciousness and headaches.  Endo/Heme/Allergies: Negative for environmental allergies.  Psychiatric/Behavioral: Negative for depression, hallucinations, substance abuse and suicidal ideas. The patient is not nervous/anxious and does not have insomnia.     BP 118/78    Pulse 69    Temp  98.3 F (36.8 C) (Temporal)    Resp 16    Ht 5\' 2"  (1.575 m)    Wt 199 lb 12.8 oz (90.6 kg)    SpO2 98%    BMI 36.54 kg/m   Physical Exam Vitals reviewed.  HENT:     Head: Normocephalic and atraumatic.     Right Ear: Tympanic membrane, ear canal and external ear normal.     Left Ear: Tympanic membrane, ear canal and external ear normal.  Nose: Nose normal. No mucosal edema.     Mouth/Throat:     Pharynx: Uvula midline. No oropharyngeal exudate or posterior oropharyngeal erythema.  Eyes:     Conjunctiva/sclera: Conjunctivae normal.     Pupils: Pupils are equal, round, and reactive to light.  Neck:     Thyroid: No thyromegaly.  Cardiovascular:     Rate and Rhythm: Normal rate and regular rhythm.     Heart sounds: Normal heart sounds.  Pulmonary:     Effort: Pulmonary effort is normal. No respiratory distress.     Breath sounds: Normal breath sounds. No wheezing or rales.  Abdominal:     General: Bowel sounds are normal. There is no distension.     Palpations: Abdomen is soft. There is no mass.     Tenderness: There is no abdominal tenderness. There is no guarding or rebound.  Musculoskeletal:     Cervical back: Neck supple.  Lymphadenopathy:     Cervical: No cervical adenopathy.  Skin:    General: Skin is warm and dry.     Findings: No rash.  Neurological:     Mental Status: She is alert and oriented to person, place, and time.     Cranial Nerves: No cranial nerve deficit.     Assessment/Plan: 1. Visit for preventive health examination Depression screen negative. Health Maintenance reviewed. Preventive schedule discussed and handout given in AVS. Will obtain fasting labs today.  - CBC with Differential/Platelet  2. Need for hepatitis C screening test Due per guidelines. Hep C antibody ordered. - Hepatitis C Antibody  3. Need for shingles vaccine Shingrix 2 of 2 given today.   4. Class 2 obesity without serious comorbidity with body mass index (BMI) of 36.0 to  36.9 in adult, unspecified obesity type Continue management per [facility]. Repeat labs today to risk stratify.  - Comprehensive metabolic panel - Hemoglobin A1c - Lipid panel - TSH  5. Lipoma of left upper extremity Referral to Derm placed for evaluation and excision.  - Ambulatory referral to Dermatology    This visit occurred during the SARS-CoV-2 public health emergency.  Safety protocols were in place, including screening questions prior to the visit, additional usage of staff PPE, and extensive cleaning of exam room while observing appropriate contact time as indicated for disinfecting solutions.    Leeanne Rio, PA-C

## 2020-06-09 LAB — HEPATITIS C ANTIBODY
Hepatitis C Ab: NONREACTIVE
SIGNAL TO CUT-OFF: 0.01 (ref <1.00)

## 2020-06-14 MED ORDER — METFORMIN HCL 500 MG PO TABS: 500.0000 mg | ORAL_TABLET | Freq: Two times a day (BID) | ORAL | 0 refills | Status: DC

## 2020-06-14 MED ORDER — BUPROPION HCL ER (SR) 150 MG PO TB12: 150.0000 mg | ORAL_TABLET | Freq: Every day | ORAL | 0 refills | Status: DC

## 2020-06-24 ENCOUNTER — Ambulatory Visit: Payer: BC Managed Care – PPO | Attending: Internal Medicine

## 2020-06-24 DIAGNOSIS — Z23 Encounter for immunization: Secondary | ICD-10-CM

## 2020-06-24 NOTE — Progress Notes (Signed)
   Covid-19 Vaccination Clinic  Name:  Donna Mendoza    MRN: [id number] DOB: Aug 15, 1970  06/24/2020  Donna Mendoza was observed post Covid-19 immunization for 15 minutes without incident. She was provided with Vaccine Information Sheet and instruction to access the V-Safe system.   Donna Mendoza was instructed to call 911 with any severe reactions post vaccine: Marland Kitchen Difficulty breathing  . Swelling of face and throat  . A fast heartbeat  . A bad rash all over body  . Dizziness and weakness   Immunizations Administered    Name Date Dose VIS Date Route   Pfizer COVID-19 Vaccine 06/24/2020 11:09 AM 0.3 mL 05/10/2020 Intramuscular   Manufacturer: [place]   Lot: [id number]   NDC: [address]-7

## 2020-06-29 ENCOUNTER — Ambulatory Visit (INDEPENDENT_AMBULATORY_CARE_PROVIDER_SITE_OTHER): Payer: BC Managed Care – PPO | Admitting: Family Medicine

## 2020-06-29 ENCOUNTER — Other Ambulatory Visit: Payer: Self-pay

## 2020-06-29 ENCOUNTER — Encounter (INDEPENDENT_AMBULATORY_CARE_PROVIDER_SITE_OTHER): Payer: Self-pay | Admitting: Family Medicine

## 2020-06-29 VITALS — BP 113/77 | HR 68 | Temp 98.2°F | Ht 62.0 in | Wt 193.0 lb

## 2020-06-29 DIAGNOSIS — R7303 Prediabetes: Secondary | ICD-10-CM

## 2020-06-29 DIAGNOSIS — K219 Gastro-esophageal reflux disease without esophagitis: Secondary | ICD-10-CM

## 2020-06-29 DIAGNOSIS — F3289 Other specified depressive episodes: Secondary | ICD-10-CM | POA: Diagnosis not present

## 2020-06-29 DIAGNOSIS — Z9189 Other specified personal risk factors, not elsewhere classified: Secondary | ICD-10-CM | POA: Diagnosis not present

## 2020-06-29 DIAGNOSIS — Z6835 Body mass index (BMI) 35.0-35.9, adult: Secondary | ICD-10-CM

## 2020-06-29 DIAGNOSIS — E559 Vitamin D deficiency, unspecified: Secondary | ICD-10-CM

## 2020-06-29 MED ORDER — PANTOPRAZOLE SODIUM 40 MG PO TBEC: 40.0000 mg | DELAYED_RELEASE_TABLET | ORAL | 0 refills | Status: DC

## 2020-07-03 NOTE — Progress Notes (Signed)
Chief Complaint:   OBESITY Donna Mendoza is here to discuss her progress with her obesity treatment plan along with follow-up of her obesity related diagnoses.   Today's visit was #: 15 Starting weight: 224 lbs Starting date: 08/03/2019 Today's weight: 193 lbs Today's date: 06/29/2020 Total lbs lost to date: 31 lbs Body mass index is 35.3 kg/m.  Total weight loss percentage to date: -13.84%  Interim History: Donna Mendoza says that in 1.5 weeks she will be going home for Christmas with her family.  She will be driving.  She has been doing some late night chocolate eating.  She is working 2 positions again.  She says that she will be going to [place] for 1 month in February. Nutrition Plan: the Category 2 Plan for 60% of the time.  Anti-obesity medications: metformin 500 mg twice daily. Reported side effects: None. Hunger is well controlled. Cravings are poorly controlled.  Activity: Walking for 20-30 minutes 2-3 times per week.  Assessment/Plan:   1. Prediabetes At goal. Goal is HgbA1c < 5.7.  Medication: metformin 500 mg twice daily.  She will continue to focus on protein-rich, low simple carbohydrate foods. We reviewed the importance of hydration, regular exercise for stress reduction, and restorative sleep.   Lab Results  Component Value Date   HGBA1C 5.7 06/08/2020   2. Vitamin D deficiency At goal. Current vitamin D is 49.8, tested on 02/28/2020. Optimal goal > 50 ng/dL.   Plan:  []   Continue Vitamin D @50 ,000 IU every week. [x]   Continue home supplement daily. [x]   Follow-up for routine testing of Vitamin D at least 2-3 times per year to avoid over-replacement.  3. Other depression, with emotional eating  Improving. Donna Mendoza is taking Wellbutrin 150 mg daily. Discussed cues and consequences, how thoughts affect eating, model of thoughts, feelings, and behaviors, and strategies for change by focusing on the cue. Discussed cognitive distortions, coping thoughts, and how to change your  thoughts.  4. At risk for heart disease Donna Mendoza was given approximately 8 minutes of coronary artery disease prevention counseling today. She is 49 y.o. female and has risk factors for heart disease including obesity and prediabetes. We discussed intensive lifestyle modifications today with an emphasis on specific weight loss instructions and strategies. Repetitive spaced learning was employed today to elicit superior memory formation and behavioral change.  During insulin resistance, several metabolic alterations induce the development of cardiovascular disease. For instance, insulin resistance can induce an imbalance in glucose metabolism that generates chronic hyperglycemia, which in turn triggers oxidative stress and causes an inflammatory response that leads to cell damage. Insulin resistance can also alter systemic lipid metabolism which then leads to the development of dyslipidemia and the well-known lipid triad: (1) high levels of plasma triglycerides, (2) low levels of high-density lipoprotein, and (3) the appearance of small dense low-density lipoproteins. This triad, along with endothelial dysfunction, which can also be induced by aberrant insulin signaling, contribute to atherosclerotic plaque formation.   5. Gastroesophageal reflux disease without esophagitis We reviewed the diagnosis of GERD and the reasons why it was important to treat. We discussed "red flag" symptoms and the importance of follow up if symptoms persisted despite treatment. We reviewed non-pharmacologic management of GERD symptoms: including: caffeine reduction, dietary changes, elevate HOB, NPO after supper, reduction of alcohol intake, tobacco cessation, and weight loss.  - Refill pantoprazole (PROTONIX) 40 MG tablet; Take 1 tablet (40 mg total) by mouth every other day.  Dispense: 90 tablet; Refill: 0  7. Class  2 severe obesity with serious comorbidity and body mass index (BMI) of 35.0 to 35.9 in adult, unspecified  obesity type (Donna Mendoza)  Course: Donna Mendoza is currently in the action stage of change. As such, her goal is to continue with weight loss efforts.   Nutrition goals: She has agreed to the Category 2 Plan.   Exercise goals: For substantial health benefits, adults should do at least 150 minutes (2 hours and 30 minutes) a week of moderate-intensity, or 75 minutes (1 hour and 15 minutes) a week of vigorous-intensity aerobic physical activity, or an equivalent combination of moderate- and vigorous-intensity aerobic activity. Aerobic activity should be performed in episodes of at least 10 minutes, and preferably, it should be spread throughout the week.  Behavioral modification strategies: increasing lean protein intake, decreasing simple carbohydrates, increasing vegetables, increasing water intake, dealing with family or coworker sabotage, travel eating strategies and holiday eating strategies .  Donna Mendoza has agreed to follow-up with our clinic in 4 weeks. She was informed of the importance of frequent follow-up visits to maximize her success with intensive lifestyle modifications for her multiple health conditions.   Objective:   Blood pressure 113/77, pulse 68, temperature 98.2 F (36.8 C), temperature source Oral, height 5\' 2"  (1.575 m), weight 193 lb (87.5 kg), SpO2 96 %. Body mass index is 35.3 kg/m.  General: Cooperative, alert, well developed, in no acute distress. HEENT: Conjunctivae and lids unremarkable. Cardiovascular: Regular rhythm.  Lungs: Normal work of breathing. Neurologic: No focal deficits.   Lab Results  Component Value Date   CREATININE 0.76 06/08/2020   BUN 11 06/08/2020   NA 140 06/08/2020   K 4.1 06/08/2020   CL 105 06/08/2020   CO2 31 06/08/2020   Lab Results  Component Value Date   ALT 21 06/08/2020   AST 21 06/08/2020   ALKPHOS 69 06/08/2020   BILITOT 0.5 06/08/2020   Lab Results  Component Value Date   HGBA1C 5.7 06/08/2020   HGBA1C 5.7 (H) 02/28/2020    HGBA1C 5.7 (H) 10/18/2019   HGBA1C 6.0 07/05/2019   HGBA1C 5.9 05/27/2018   Lab Results  Component Value Date   TSH 1.58 06/08/2020   Lab Results  Component Value Date   CHOL 143 06/08/2020   HDL 48.60 06/08/2020   LDLCALC 76 06/08/2020   TRIG 90.0 06/08/2020   CHOLHDL 3 06/08/2020   Lab Results  Component Value Date   WBC 5.9 06/08/2020   HGB 14.2 06/08/2020   HCT 42.9 06/08/2020   MCV 86.6 06/08/2020   PLT 260.0 06/08/2020   Attestation Statements:   Reviewed by clinician on day of visit: allergies, medications, problem list, medical history, surgical history, family history, social history, and previous encounter notes.  I, Water quality scientist, CMA, am acting as transcriptionist for Briscoe Deutscher, DO  I have reviewed the above documentation for accuracy and completeness, and I agree with the above. Briscoe Deutscher, DO

## 2020-07-07 ENCOUNTER — Encounter: Payer: Self-pay | Admitting: Physician Assistant

## 2020-08-02 ENCOUNTER — Ambulatory Visit (INDEPENDENT_AMBULATORY_CARE_PROVIDER_SITE_OTHER): Payer: BC Managed Care – PPO | Admitting: Family Medicine

## 2020-08-14 ENCOUNTER — Encounter: Payer: Self-pay | Admitting: Physician Assistant

## 2020-09-13 ENCOUNTER — Ambulatory Visit (INDEPENDENT_AMBULATORY_CARE_PROVIDER_SITE_OTHER): Payer: BC Managed Care – PPO | Admitting: Family Medicine

## 2020-10-02 ENCOUNTER — Other Ambulatory Visit: Payer: Self-pay

## 2020-10-02 ENCOUNTER — Ambulatory Visit (INDEPENDENT_AMBULATORY_CARE_PROVIDER_SITE_OTHER): Payer: BC Managed Care – PPO | Admitting: Family Medicine

## 2020-10-02 ENCOUNTER — Encounter (INDEPENDENT_AMBULATORY_CARE_PROVIDER_SITE_OTHER): Payer: Self-pay | Admitting: Family Medicine

## 2020-10-02 VITALS — BP 125/83 | HR 67 | Temp 97.9°F | Ht 62.0 in | Wt 196.0 lb

## 2020-10-02 DIAGNOSIS — F3289 Other specified depressive episodes: Secondary | ICD-10-CM

## 2020-10-02 DIAGNOSIS — Z809 Family history of malignant neoplasm, unspecified: Secondary | ICD-10-CM | POA: Diagnosis not present

## 2020-10-02 DIAGNOSIS — Z9189 Other specified personal risk factors, not elsewhere classified: Secondary | ICD-10-CM | POA: Diagnosis not present

## 2020-10-02 DIAGNOSIS — Z6835 Body mass index (BMI) 35.0-35.9, adult: Secondary | ICD-10-CM

## 2020-10-02 DIAGNOSIS — R7303 Prediabetes: Secondary | ICD-10-CM

## 2020-10-02 MED ORDER — METFORMIN HCL 500 MG PO TABS: 500.0000 mg | ORAL_TABLET | Freq: Two times a day (BID) | ORAL | 0 refills | Status: DC

## 2020-10-04 ENCOUNTER — Telehealth: Payer: Self-pay | Admitting: Genetic Counselor

## 2020-10-04 NOTE — Telephone Encounter (Signed)
Received a genetic counseling referral from Dr. Juleen China for a fhx of cancer. Donna Mendoza returned my call and has been scheduled to see [name] on 3/28 at [place].

## 2020-10-09 NOTE — Progress Notes (Signed)
Chief Complaint:   OBESITY Donna Mendoza is here to discuss her progress with her obesity treatment plan along with follow-up of her obesity related diagnoses.   Today's visit was #: 16 Starting weight: 224 lbs Starting date: 08/03/2019 Today's weight: 196 lbs Today's date: 10/02/2020 Total lbs lost to date: 28 lbs Body mass index is 35.85 kg/m.  Total weight loss percentage to date: -12.50%  Interim History:  Donna Mendoza has plans to go to [place] in June and has other celebrations in 6-8 weeks.She reports that her mother died at age 10 of a brain tumor, right cavernous meningioma/aneurysm.  Her grandmother had cancer (bowel/ovarian).  Current Meal Plan: the Category 2 Plan for 25% of the time.  Current Exercise Plan: None. Current Anti-Obesity Medications: metformin 500 mg twice daily. Side effects: None.  Assessment/Plan:   1. Prediabetes Not at goal. Goal is HgbA1c < 5.7.  Medication: metformin 500 mg twice daily.    Plan:  She will continue to focus on protein-rich, low simple carbohydrate foods. We reviewed the importance of hydration, regular exercise for stress reduction, and restorative sleep.   Lab Results  Component Value Date   HGBA1C 5.7 06/08/2020   - Refill metFORMIN (GLUCOPHAGE) 500 MG tablet; Take 1 tablet (500 mg total) by mouth 2 (two) times daily with a meal.  Dispense: 180 tablet; Refill: 0  2. Family history of cancer Her mother died of cancer at age 58.  Her grandmother died in her 85s.This is causing significant anxiety for her.   Plan:  Will place a referral to genetic counseling.  - Ambulatory referral to Genetics  3. Other depression, with emotional eating  Controlled. Medication: Wellbutrin 150 mg daily.  Plan:  She plans to stop Wellbutrin when she runs out of it.  I advised her to call if she decides to refill.  Behavior modification techniques were discussed today to help deal with emotional/non-hunger eating behaviors.  4. At risk for heart  disease Due to Donna Mendoza's current state of health and medical condition(s), she is at a higher risk for heart disease.  This puts the patient at much greater risk to subsequently develop cardiopulmonary conditions that can significantly affect patient's quality of life in a negative manner.    At least 8 minutes were spent on counseling Donna Mendoza about these concerns today. Evidence-based interventions for health behavior change were utilized today including the discussion of self monitoring techniques, problem-solving barriers, and SMART goal setting techniques.  Specifically, regarding patient's less desirable eating habits and patterns, we employed the technique of small changes when Donna Mendoza has not been able to fully commit to her prudent nutritional plan.  5. Class 2 severe obesity with serious comorbidity and body mass index (BMI) of 35.0 to 35.9 in adult, unspecified obesity type (Donna Mendoza)  Course: Donna Mendoza is currently in the action stage of change. As such, her goal is to continue with weight loss efforts.   Nutrition goals: She has agreed to the Category 2 Plan.   Exercise goals: For substantial health benefits, adults should do at least 150 minutes (2 hours and 30 minutes) a week of moderate-intensity, or 75 minutes (1 hour and 15 minutes) a week of vigorous-intensity aerobic physical activity, or an equivalent combination of moderate- and vigorous-intensity aerobic activity. Aerobic activity should be performed in episodes of at least 10 minutes, and preferably, it should be spread throughout the week.  Behavioral modification strategies: increasing lean protein intake, decreasing simple carbohydrates, increasing vegetables, increasing water intake and emotional  eating strategies.  Donna Mendoza has agreed to follow-up with our clinic in 4 weeks. She was informed of the importance of frequent follow-up visits to maximize her success with intensive lifestyle modifications for her multiple health  conditions.   Objective:   Blood pressure 125/83, pulse 67, temperature 97.9 F (36.6 C), temperature source Oral, height 5\' 2"  (1.575 m), weight 196 lb (88.9 kg), SpO2 97 %. Body mass index is 35.85 kg/m.  General: Cooperative, alert, well developed, in no acute distress. HEENT: Conjunctivae and lids unremarkable. Cardiovascular: Regular rhythm.  Lungs: Normal work of breathing. Neurologic: No focal deficits.   Lab Results  Component Value Date   CREATININE 0.76 06/08/2020   BUN 11 06/08/2020   NA 140 06/08/2020   K 4.1 06/08/2020   CL 105 06/08/2020   CO2 31 06/08/2020   Lab Results  Component Value Date   ALT 21 06/08/2020   AST 21 06/08/2020   ALKPHOS 69 06/08/2020   BILITOT 0.5 06/08/2020   Lab Results  Component Value Date   HGBA1C 5.7 06/08/2020   HGBA1C 5.7 (H) 02/28/2020   HGBA1C 5.7 (H) 10/18/2019   HGBA1C 6.0 07/05/2019   HGBA1C 5.9 05/27/2018   Lab Results  Component Value Date   TSH 1.58 06/08/2020   Lab Results  Component Value Date   CHOL 143 06/08/2020   HDL 48.60 06/08/2020   LDLCALC 76 06/08/2020   TRIG 90.0 06/08/2020   CHOLHDL 3 06/08/2020   Lab Results  Component Value Date   WBC 5.9 06/08/2020   HGB 14.2 06/08/2020   HCT 42.9 06/08/2020   MCV 86.6 06/08/2020   PLT 260.0 06/08/2020   Attestation Statements:   Reviewed by clinician on day of visit: allergies, medications, problem list, medical history, surgical history, family history, social history, and previous encounter notes.  I, Water quality scientist, CMA, am acting as transcriptionist for Briscoe Deutscher, DO  I have reviewed the above documentation for accuracy and completeness, and I agree with the above. Briscoe Deutscher, DO

## 2020-10-16 ENCOUNTER — Other Ambulatory Visit: Payer: Self-pay

## 2020-10-16 ENCOUNTER — Inpatient Hospital Stay: Payer: BC Managed Care – PPO

## 2020-10-16 ENCOUNTER — Encounter: Payer: Self-pay | Admitting: Licensed Clinical Social Worker

## 2020-10-16 ENCOUNTER — Inpatient Hospital Stay: Payer: BC Managed Care – PPO | Attending: Genetic Counselor | Admitting: Licensed Clinical Social Worker

## 2020-10-16 DIAGNOSIS — Z809 Family history of malignant neoplasm, unspecified: Secondary | ICD-10-CM | POA: Insufficient documentation

## 2020-10-16 DIAGNOSIS — Z801 Family history of malignant neoplasm of trachea, bronchus and lung: Secondary | ICD-10-CM | POA: Diagnosis not present

## 2020-10-16 LAB — GENETIC SCREENING ORDER

## 2020-10-16 NOTE — Progress Notes (Signed)
REFERRING PROVIDER: Briscoe Deutscher, [address],  [place] [address]  PRIMARY PROVIDER:  Pcp, No  PRIMARY REASON FOR VISIT:  1. Family history of lung cancer   2. Family history of cancer      HISTORY OF PRESENT ILLNESS:   Ms. Antonelli, a 50 y.o. female, was seen for a  cancer genetics consultation at the request of Dr. Juleen China due to a family history of cancer.  Ms. Plouff presents to clinic today to discuss the possibility of a hereditary predisposition to cancer, genetic testing, and to further clarify her future cancer risks, as well as potential cancer risks for family members.    Ms. Gilchrest is a 50 y.o. female with no personal history of cancer.    CANCER HISTORY:  Oncology History   No history exists.     RISK FACTORS:  Menarche was at age 33 Ovaries intact: yes.  Hysterectomy: no.  Menopausal status: premenopausal.  HRT use: 0 years. Colonoscopy: yes; normal. Mammogram within the last year: yes. Number of breast biopsies: 1.   Past Medical History:  Diagnosis Date  . Allergy    seasonal  . Family history of cancer   . Family history of lung cancer   . Gallbladder problem   . GERD (gastroesophageal reflux disease)   . Heartburn   . History of chicken pox   . Venous thrombosis of leg    Left  . Vitamin D deficiency     Past Surgical History:  Procedure Laterality Date  . BREAST SURGERY     Fibroidadenoma-Left  . CHOLECYSTECTOMY    . Neck Fusion  06/2016  . REFRACTIVE SURGERY     Bilateral  . ruptured disc    . TONSILLECTOMY    . WISDOM TOOTH EXTRACTION      Social History   Socioeconomic History  . Marital status: Single    Spouse name: Not on file  . Number of children: Not on file  . Years of education: Not on file  . Highest education level: Not on file  Occupational History  . Not on file  Tobacco Use  . Smoking status: Never Smoker  . Smokeless tobacco: Never Used  Vaping Use  . Vaping  Use: Never used  Substance and Sexual Activity  . Alcohol use: Yes    Alcohol/week: 0.0 standard drinks    Comment: rare  . Drug use: No  . Sexual activity: Yes    Partners: Male    Birth control/protection: Pill    Comment: boyfriend  Other Topics Concern  . Not on file  Social History Narrative  . Not on file   Social Determinants of Health   Financial Resource Strain: Not on file  Food Insecurity: Not on file  Transportation Needs: Not on file  Physical Activity: Not on file  Stress: Not on file  Social Connections: Not on file     FAMILY HISTORY:  We obtained a detailed, 4-generation family history.  Significant diagnoses are listed below: Family History  Problem Relation Age of Onset  . Brain cancer Mother 34       meningioma  . Obesity Mother   . Glaucoma Maternal Grandfather   . Glaucoma Maternal Aunt   . Cancer Maternal Grandmother        either colon or ovarian  . Lung cancer Maternal Aunt   . Healthy Sister        x1  . Cancer Other  several mat great aunts/uncles; d. 32s, unknown cancers  . Colon cancer Neg Hx   . Colon polyps Neg Hx   . Esophageal cancer Neg Hx   . Stomach cancer Neg Hx   . Rectal cancer Neg Hx    Ms. Boss has 1 full sister, no cancers. She has 2 paternal half sisters, no known cancers. She has limited information about her paternal side of the family.  [name] mother had a meningioma and passed at 51. She had a sister and a brother. Her sister had lung cancer and died at 19. Patient's maternal grandmother had cancer and died at 15, this was either colon or ovarian cancer. Grandmother had several brothers and sisters who had cancer, unknown types, and passed in their 44s. Grandfather passed at 72.  Ms. Aubert is unaware of previous family history of genetic testing for hereditary cancer risks. Patient's maternal ancestors are of [place] descent, and paternal ancestors are of unknown descent. There is no reported  Ashkenazi Jewish ancestry. There is no known consanguinity.  GENETIC COUNSELING ASSESSMENT: Ms. Compere is a 50 y.o. female with a family history which is not particularly suggestive of a hereditary cancer syndrome and predisposition to cancer. We, therefore, discussed and recommended the following at today's visit.   DISCUSSION: We discussed that approximately 5-10% of cancer is hereditary. There are many genes we can look at that are associated with different types of cancer. Some are associated with ovarian/colon cancer, as well as other types such as breast, prostate, pancreatic, etc. While her family history is not particularly suggestive of a hereditary cancer condition, patient still expressed interest in testing and we discussed this is reasonable given the limited information about her paternal side as well as the possible ovarian cancer in her grandmother/unknown cancers in her generation.  We discussed that testing is beneficial for several reasons including knowing about cancer risks, identifying potential screening and risk-reduction options that may be appropriate, and to understand if other family members could be at risk for cancer and allow them to undergo genetic testing.   We reviewed the characteristics, features and inheritance patterns of hereditary cancer syndromes. We also discussed genetic testing, including the appropriate family members to test, the process of testing, insurance coverage and turn-around-time for results. We discussed the implications of a negative, positive and/or variant of uncertain significant result. We offered for  Ms. Wysocki to pursue genetic testing for the [facility] CancerNext-Expanded+RNA gene panel.   The CancerNext-Expanded + RNAinsight gene panel offered by [employer] and includes sequencing and rearrangement analysis for the following 77 genes: IP, ALK, APC*, ATM*, AXIN2, BAP1, BARD1, BLM, BMPR1A, BRCA1*, BRCA2*, BRIP1*, CDC73, CDH1*,CDK4, CDKN1B,  CDKN2A, CHEK2*, CTNNA1, DICER1, FANCC, FH, FLCN, GALNT12, KIF1B, LZTR1, MAX, MEN1, MET, MLH1*, MSH2*, MSH3, MSH6*, MUTYH*, NBN, NF1*, NF2, NTHL1, PALB2*, PHOX2B, PMS2*, POT1, PRKAR1A, PTCH1, PTEN*, RAD51C*, RAD51D*,RB1, RECQL, RET, SDHA, SDHAF2, SDHB, SDHC, SDHD, SMAD4, SMARCA4, SMARCB1, SMARCE1, STK11, SUFU, TMEM127, TP53*,TSC1, TSC2, VHL and XRCC2 (sequencing and deletion/duplication); EGFR, EGLN1, HOXB13, KIT, MITF, PDGFRA, POLD1 and POLE (sequencing only); EPCAM and GREM1 (deletion/duplication only).  Based on Ms. Chaloux's family history of cancer, she does not medical criteria for genetic testing.  She may have an out of pocket cost.   We discussed that some people do not want to undergo genetic testing due to fear of genetic discrimination.  A federal law called the Genetic Information Non-Discrimination Act (GINA) of 2008 helps protect individuals against genetic discrimination based on their genetic test results.  It impacts both health insurance and employment.  For health insurance, it protects against increased premiums, being kicked off insurance or being forced to take a test in order to be insured.  For employment it protects against hiring, firing and promoting decisions based on genetic test results.  Health status due to a cancer diagnosis is not protected under GINA.  This law does not protect life insurance, disability insurance, or other types of insurance.   PLAN: After considering the risks, benefits, and limitations, Ms. [facility] provided informed consent to pursue genetic testing and the blood sample was sent to [facility] for analysis of the CancerNext-Expanded+RNA panel. Results should be available within approximately 2-3 weeks' time, at which point they will be disclosed by telephone to Ms. [facility], as will any additional recommendations warranted by these results. Ms. Stanard will receive a summary of her genetic counseling visit and a copy of her results once  available. This information will also be available in Epic.   Ms. Warbington questions were answered to her satisfaction today. Our contact information was provided should additional questions or concerns arise. Thank you for the referral and allowing [place] to share in the care of your patient.   Faith Rogue, MS, [facility] Genetic Counselor [email or web address] Phone: [phone]  The patient was seen for a total of 40 minutes in face-to-face genetic counseling. [employer] intern Magda Paganini was also present and assisted with this case.  Dr. Grayland Ormond was available for discussion regarding this case.   _______________________________________________________________________ For Office Staff:  Number of people involved in session: 2 Was an Intern/ student involved with case: yes

## 2020-10-31 ENCOUNTER — Ambulatory Visit: Payer: Self-pay | Admitting: Licensed Clinical Social Worker

## 2020-10-31 ENCOUNTER — Encounter: Payer: Self-pay | Admitting: Licensed Clinical Social Worker

## 2020-10-31 ENCOUNTER — Telehealth: Payer: Self-pay | Admitting: Licensed Clinical Social Worker

## 2020-10-31 DIAGNOSIS — Z1379 Encounter for other screening for genetic and chromosomal anomalies: Secondary | ICD-10-CM

## 2020-10-31 DIAGNOSIS — Z801 Family history of malignant neoplasm of trachea, bronchus and lung: Secondary | ICD-10-CM

## 2020-10-31 DIAGNOSIS — Z809 Family history of malignant neoplasm, unspecified: Secondary | ICD-10-CM

## 2020-10-31 NOTE — Progress Notes (Signed)
HPI:  Donna Mendoza was previously seen in the [place] clinic due to a family history of cancer and concerns regarding a hereditary predisposition to cancer. Please refer to our prior cancer genetics clinic note for more information regarding our discussion, assessment and recommendations, at the time. Donna Mendoza recent genetic test results were disclosed to her, as were recommendations warranted by these results. These results and recommendations are discussed in more detail below.  CANCER HISTORY:  Oncology History   No history exists.    FAMILY HISTORY:  We obtained a detailed, 4-generation family history.  Significant diagnoses are listed below: Family History  Problem Relation Age of Onset  . Brain cancer Mother 70       meningioma  . Obesity Mother   . Glaucoma Maternal Grandfather   . Glaucoma Maternal Aunt   . Cancer Maternal Grandmother        either colon or ovarian  . Lung cancer Maternal Aunt   . Healthy Sister        x1  . Cancer Other        several mat great aunts/uncles; d. 77s, unknown cancers  . Colon cancer Neg Hx   . Colon polyps Neg Hx   . Esophageal cancer Neg Hx   . Stomach cancer Neg Hx   . Rectal cancer Neg Hx    Donna Mendoza has 1 full sister, no cancers. She has 2 paternal half sisters, no known cancers. She has limited information about her paternal side of the family.  Donna Mendoza mother had a meningioma and passed at 73. She had a sister and a brother. Her sister had lung cancer and died at 20. Patient's maternal grandmother had cancer and died at 13, this was either colon or ovarian cancer. Grandmother had several brothers and sisters who had cancer, unknown types, and passed in their 44s. Grandfather passed at 67.  Donna Mendoza is unaware of previous family history of genetic testing for hereditary cancer risks. Patient's maternal ancestors are of [place] descent, and paternal ancestors are of unknown descent. There is no  reported Ashkenazi Jewish ancestry. There is no known consanguinity.  GENETIC TEST RESULTS: Genetic testing reported out on 10/29/2020 through the [facility] CancerNext-Expanded+RNA cancer panel found no pathogenic mutations.   The CancerNext-Expanded + RNAinsight gene panel offered by [employer] and includes sequencing and rearrangement analysis for the following 77 genes: IP, ALK, APC*, ATM*, AXIN2, BAP1, BARD1, BLM, BMPR1A, BRCA1*, BRCA2*, BRIP1*, CDC73, CDH1*,CDK4, CDKN1B, CDKN2A, CHEK2*, CTNNA1, DICER1, FANCC, FH, FLCN, GALNT12, KIF1B, LZTR1, MAX, MEN1, MET, MLH1*, MSH2*, MSH3, MSH6*, MUTYH*, NBN, NF1*, NF2, NTHL1, PALB2*, PHOX2B, PMS2*, POT1, PRKAR1A, PTCH1, PTEN*, RAD51C*, RAD51D*,RB1, RECQL, RET, SDHA, SDHAF2, SDHB, SDHC, SDHD, SMAD4, SMARCA4, SMARCB1, SMARCE1, STK11, SUFU, TMEM127, TP53*,TSC1, TSC2, VHL and XRCC2 (sequencing and deletion/duplication); EGFR, EGLN1, HOXB13, KIT, MITF, PDGFRA, POLD1 and POLE (sequencing only); EPCAM and GREM1 (deletion/duplication only).   The test report has been scanned into EPIC and is located under the Molecular Pathology section of the Results Review tab.  A portion of the result report is included below for reference.     We discussed with Donna Mendoza that because current genetic testing is not perfect, it is possible there may be a gene mutation in one of these genes that current testing cannot detect, but that chance is small.  We also discussed, that there could be another gene that has not yet been discovered, or that we have not yet tested, that is responsible for  the cancer diagnoses in the family. It is also possible there is a hereditary cause for the cancer in the family that [name] did not inherit and therefore was not identified in her testing.  Therefore, it is important to remain in touch with cancer genetics in the future so that we can continue to offer Donna Mendoza the most up to date genetic testing.   ADDITIONAL GENETIC TESTING: We  discussed with Donna Mendoza that her genetic testing was fairly extensive.  If there are genes identified to increase cancer risk that can be analyzed in the future, we would be happy to discuss and coordinate this testing at that time.    CANCER SCREENING RECOMMENDATIONS: Donna Mendoza test result is considered negative (normal).  This means that we have not identified a hereditary cause for her family history of cancer at this time.   While reassuring, this does not definitively rule out a hereditary predisposition to cancer. It is still possible that there could be genetic mutations that are undetectable by current technology. There could be genetic mutations in genes that have not been tested or identified to increase cancer risk.  Therefore, it is recommended she continue to follow the cancer management and screening guidelines provided by her primary healthcare provider.   An individual's cancer risk and medical management are not determined by genetic test results alone. Overall cancer risk assessment incorporates additional factors, including personal medical history, family history, and any available genetic information that may result in a personalized plan for cancer prevention and surveillance.  RECOMMENDATIONS FOR FAMILY MEMBERS:  Relatives in this family might be at some increased risk of developing cancer, over the general population risk, simply due to the family history of cancer.  We recommended female relatives in this family have a yearly mammogram beginning at age 36, or 98 years younger than the earliest onset of cancer, an annual clinical breast exam, and perform monthly breast self-exams. Female relatives in this family should also have a gynecological exam as recommended by their primary provider.  All family members should be referred for colonoscopy starting at age 57.    It is also possible there is a hereditary cause for the cancer in Ms. Wren's family that she did not  inherit and therefore was not identified in her.  Based on Donna Mendoza's family history, we recommended maternal relatives have genetic counseling and testing if it does turn out that there was ovarian cancer on that side of the family, or young cancer. Ms. Zern will let us know if we can be of any assistance in coordinating genetic counseling and/or testing for these family members.  FOLLOW-UP: Lastly, we discussed with Ms. Schmelzle that cancer genetics is a rapidly advancing field and it is possible that new genetic tests will be appropriate for her and/or her family members in the future. We encouraged her to remain in contact with cancer genetics on an annual basis so we can update her personal and family histories and let her know of advances in cancer genetics that may benefit this family.   Our contact number was provided. Ms. Caris questions were answered to her satisfaction, and she knows she is welcome to call us at anytime with additional questions or concerns.   Faith Rogue, MS, [facility] Genetic Counselor [email or web address] Phone: [phone]

## 2020-10-31 NOTE — Telephone Encounter (Signed)
Revealed negative genetic testing.  This normal result is reassuring.  It is unlikely that there is an increased risk of cancer due to a mutation in one of these genes.  However, genetic testing is not perfect, and cannot definitively rule out a hereditary cause.  It will be important for her to keep in contact with genetics to learn if any additional testing may be needed in the future.      

## 2020-11-13 ENCOUNTER — Encounter (INDEPENDENT_AMBULATORY_CARE_PROVIDER_SITE_OTHER): Payer: Self-pay | Admitting: Family Medicine

## 2020-11-13 ENCOUNTER — Other Ambulatory Visit: Payer: Self-pay

## 2020-11-13 ENCOUNTER — Ambulatory Visit (INDEPENDENT_AMBULATORY_CARE_PROVIDER_SITE_OTHER): Payer: BC Managed Care – PPO | Admitting: Family Medicine

## 2020-11-13 VITALS — BP 113/76 | HR 5 | Temp 98.0°F | Ht 62.0 in | Wt 200.0 lb

## 2020-11-13 DIAGNOSIS — R7301 Impaired fasting glucose: Secondary | ICD-10-CM

## 2020-11-13 DIAGNOSIS — R7303 Prediabetes: Secondary | ICD-10-CM

## 2020-11-13 DIAGNOSIS — F3289 Other specified depressive episodes: Secondary | ICD-10-CM | POA: Diagnosis not present

## 2020-11-13 DIAGNOSIS — F418 Other specified anxiety disorders: Secondary | ICD-10-CM

## 2020-11-13 DIAGNOSIS — Z6841 Body Mass Index (BMI) 40.0 and over, adult: Secondary | ICD-10-CM

## 2020-11-13 DIAGNOSIS — Z9189 Other specified personal risk factors, not elsewhere classified: Secondary | ICD-10-CM

## 2020-11-13 MED ORDER — OZEMPIC (0.25 OR 0.5 MG/DOSE) 2 MG/1.5ML ~~LOC~~ SOPN: 0.2500 mg | PEN_INJECTOR | SUBCUTANEOUS | 0 refills | Status: DC

## 2020-11-13 NOTE — Progress Notes (Signed)
Chief Complaint:   OBESITY Donna Mendoza is here to discuss her progress with her obesity treatment plan along with follow-up of her obesity related diagnoses.   Today's visit was #: 84 Starting weight: 224 lbs Starting date: 08/03/2019 Today's weight: 200 lbs Today's date: 11/13/2020 Total lbs lost to date: 24 lbs Body mass index is 36.58 kg/m.  Total weight loss percentage to date: -10.71%  Interim History:  [name] says she has been having increased anxiety. Current Meal Plan: the Category 2 Plan for 30% of the time.  Current Exercise Plan: None.  Assessment/Plan:   1. Prediabetes At goal. Goal is HgbA1c < 5.7.  Medication: metformin 500 mg twice daily.    Plan:  Continue metformin.  She will continue to focus on protein-rich, low simple carbohydrate foods. We reviewed the importance of hydration, regular exercise for stress reduction, and restorative sleep.   Lab Results  Component Value Date   HGBA1C 5.7 06/08/2020   2. Impaired fasting glucose Will start Ozempic 0.25 mg subcutaneously weekly, as per below.  - Start Semaglutide,0.25 or 0.5MG /DOS, (OZEMPIC, 0.25 OR 0.5 MG/DOSE,) 2 MG/1.5ML SOPN; Inject 0.25 mg into the skin once a week.  Dispense: 1.5 mL; Refill: 0  3. Situational anxiety Behavior modification techniques were discussed today to help Donna Mendoza deal with her anxiety.    4. Other depression, with emotional eating  Improving, but not optimized. Medication: None.  Plan:  Behavior modification techniques were discussed today to help deal with emotional/non-hunger eating behaviors.  5. At risk for heart disease Due to Donna Mendoza's current state of health and medical condition(s), she is at a higher risk for heart disease.  This puts the patient at much greater risk to subsequently develop cardiopulmonary conditions that can significantly affect patient's quality of life in a negative manner.    At least 9 minutes were spent on counseling Donna Mendoza about these  concerns today. Evidence-based interventions for health behavior change were utilized today including the discussion of self monitoring techniques, problem-solving barriers, and SMART goal setting techniques.  Specifically, regarding patient's less desirable eating habits and patterns, we employed the technique of small changes when Donna Mendoza has not been able to fully commit to her prudent nutritional plan.  6. Obesity, current BMI 36.6  Course: Donna Mendoza is currently in the action stage of change. As such, her goal is to continue with weight loss efforts.   Nutrition goals: She has agreed to the Category 2 Plan.   Exercise goals: For substantial health benefits, adults should do at least 150 minutes (2 hours and 30 minutes) a week of moderate-intensity, or 75 minutes (1 hour and 15 minutes) a week of vigorous-intensity aerobic physical activity, or an equivalent combination of moderate- and vigorous-intensity aerobic activity. Aerobic activity should be performed in episodes of at least 10 minutes, and preferably, it should be spread throughout the week.  Behavioral modification strategies: increasing lean protein intake, decreasing simple carbohydrates, increasing vegetables and increasing water intake.  [name] has agreed to follow-up with our clinic in 4 weeks. She was informed of the importance of frequent follow-up visits to maximize her success with intensive lifestyle modifications for her multiple health conditions.   Objective:   Blood pressure 113/76, pulse (!) 5, temperature 98 F (36.7 C), temperature source Oral, height 5\' 2"  (1.575 m), weight 200 lb (90.7 kg), SpO2 96 %. Body mass index is 36.58 kg/m.  General: Cooperative, alert, well developed, in no acute distress. HEENT: Conjunctivae and lids unremarkable. Cardiovascular: Regular rhythm.  Lungs: Normal work of breathing. Neurologic: No focal deficits.   Lab Results  Component Value Date   CREATININE 0.76 06/08/2020    BUN 11 06/08/2020   NA 140 06/08/2020   K 4.1 06/08/2020   CL 105 06/08/2020   CO2 31 06/08/2020   Lab Results  Component Value Date   ALT 21 06/08/2020   AST 21 06/08/2020   ALKPHOS 69 06/08/2020   BILITOT 0.5 06/08/2020   Lab Results  Component Value Date   HGBA1C 5.7 06/08/2020   HGBA1C 5.7 (H) 02/28/2020   HGBA1C 5.7 (H) 10/18/2019   HGBA1C 6.0 07/05/2019   HGBA1C 5.9 05/27/2018   Lab Results  Component Value Date   TSH 1.58 06/08/2020   Lab Results  Component Value Date   CHOL 143 06/08/2020   HDL 48.60 06/08/2020   LDLCALC 76 06/08/2020   TRIG 90.0 06/08/2020   CHOLHDL 3 06/08/2020   Lab Results  Component Value Date   WBC 5.9 06/08/2020   HGB 14.2 06/08/2020   HCT 42.9 06/08/2020   MCV 86.6 06/08/2020   PLT 260.0 06/08/2020   Attestation Statements:   Reviewed by clinician on day of visit: allergies, medications, problem list, medical history, surgical history, family history, social history, and previous encounter notes.  I, Water quality scientist, CMA, am acting as transcriptionist for Briscoe Deutscher, DO  I have reviewed the above documentation for accuracy and completeness, and I agree with the above. Briscoe Deutscher, DO

## 2020-11-14 ENCOUNTER — Encounter: Payer: Self-pay | Admitting: Dermatology

## 2020-11-14 ENCOUNTER — Ambulatory Visit (INDEPENDENT_AMBULATORY_CARE_PROVIDER_SITE_OTHER): Payer: BC Managed Care – PPO | Admitting: Dermatology

## 2020-11-14 DIAGNOSIS — L219 Seborrheic dermatitis, unspecified: Secondary | ICD-10-CM | POA: Diagnosis not present

## 2020-11-14 DIAGNOSIS — L738 Other specified follicular disorders: Secondary | ICD-10-CM | POA: Diagnosis not present

## 2020-11-14 DIAGNOSIS — D1722 Benign lipomatous neoplasm of skin and subcutaneous tissue of left arm: Secondary | ICD-10-CM | POA: Diagnosis not present

## 2020-11-14 DIAGNOSIS — D1800 Hemangioma unspecified site: Secondary | ICD-10-CM | POA: Diagnosis not present

## 2020-11-24 NOTE — Progress Notes (Signed)
   New Patient   Subjective  Donna Mendoza is a 50 y.o. female who presents for the following: Skin Problem (? Lipoma on left arm- per PCP- sometimes bothersome if I hit it- but not to bothersome otherwise).  Possible lipoma left arm plus several other spots to check Location:  Duration:  Quality:  Associated Signs/Symptoms: Modifying Factors:  Severity:  Timing: Context:    The following portions of the chart were reviewed this encounter and updated as appropriate:  Tobacco  Allergies  Meds  Problems  Med Hx  Surg Hx  Fam Hx      Objective  Well appearing patient in no apparent distress; mood and affect are within normal limits. Objective  Right Upper Back: 66mm smooth red papule.  Objective  Mid Forehead: For subtle 80mm cream-colored papules, 1 with an eccentric dell.  Objective  Right Malar Cheek: History of dryness across the brow and central face that is not relieved with lotion.  Clinically this best fits some mild seborrheic dermatitis, perhaps aggravated by the COVID mask.  She will try 2-1/2% generic Hytone ointment nightly for 3 weeks; if there is improvement, she may taper the frequency of application.  Objective  Left Upper Arm - Anterior: Subtle soft lumpy 3 cm subcutaneous nodule left upper biceps.  I could not objectively appreciate difference in circumference of her upper arms but she does feel that the left arm is bigger.  There are never any symptoms.    A focused examination was performed including Head, neck, back, upper chest, arms, legs. Relevant physical exam findings are noted in the Assessment and Plan.   Assessment & Plan  Hemangioma, unspecified site Right Upper Back  Intervention not currently indicated.  Sebaceous hyperplasia of face Mid Forehead  I discussed with Jodye the similar appearance of an early basal cell cancer; if any of her lesions continually grow or bleed with minor trauma, she will return.  Seborrheic  dermatitis Right Malar Cheek  Over the counter hydrocortisone ointment   Lipoma of left upper extremity Left Upper Arm - Anterior  We discussed the benign nature of lipomas and the fact that physical examination or even imaging studies are never as accurate as biopsy.  She will decide if she would rather have this lesion excised; if so, I will refer her to a general surgeon.

## 2020-12-08 ENCOUNTER — Other Ambulatory Visit: Payer: Self-pay

## 2020-12-08 ENCOUNTER — Other Ambulatory Visit (HOSPITAL_BASED_OUTPATIENT_CLINIC_OR_DEPARTMENT_OTHER): Payer: Self-pay

## 2020-12-08 ENCOUNTER — Ambulatory Visit: Payer: BC Managed Care – PPO | Attending: Internal Medicine

## 2020-12-08 DIAGNOSIS — Z23 Encounter for immunization: Secondary | ICD-10-CM

## 2020-12-08 MED ORDER — PFIZER-BIONT COVID-19 VAC-TRIS 30 MCG/0.3ML IM SUSP
INTRAMUSCULAR | 0 refills | Status: DC
  Filled 2020-12-08: qty 0.3, 1d supply, fill #0

## 2020-12-08 NOTE — Progress Notes (Signed)
   Covid-19 Vaccination Clinic  Name:  Krisanne Lich    MRN: [id number] DOB: [birth year]  12/08/2020  Ms. Strother was observed post Covid-19 immunization for 15 minutes without incident. She was provided with Vaccine Information Sheet and instruction to access the V-Safe system.   Ms. Sargeant was instructed to call 911 with any severe reactions post vaccine: Marland Kitchen Difficulty breathing  . Swelling of face and throat  . A fast heartbeat  . A bad rash all over body  . Dizziness and weakness   Immunizations Administered    Name Date Dose VIS Date Route   PFIZER Comrnaty(Gray TOP) Covid-19 Vaccine 12/08/2020 12:53 PM 0.3 mL 06/29/2020 Intramuscular   Manufacturer: [place]   Lot: [id number]   NDC: [id number]

## 2020-12-14 ENCOUNTER — Other Ambulatory Visit: Payer: Self-pay

## 2020-12-14 ENCOUNTER — Encounter (INDEPENDENT_AMBULATORY_CARE_PROVIDER_SITE_OTHER): Payer: Self-pay | Admitting: Family Medicine

## 2020-12-14 ENCOUNTER — Ambulatory Visit (INDEPENDENT_AMBULATORY_CARE_PROVIDER_SITE_OTHER): Payer: BC Managed Care – PPO | Admitting: Family Medicine

## 2020-12-14 VITALS — BP 114/78 | HR 61 | Temp 98.0°F | Ht 62.0 in | Wt 200.0 lb

## 2020-12-14 DIAGNOSIS — K219 Gastro-esophageal reflux disease without esophagitis: Secondary | ICD-10-CM | POA: Diagnosis not present

## 2020-12-14 DIAGNOSIS — Z9189 Other specified personal risk factors, not elsewhere classified: Secondary | ICD-10-CM

## 2020-12-14 DIAGNOSIS — F418 Other specified anxiety disorders: Secondary | ICD-10-CM | POA: Diagnosis not present

## 2020-12-14 DIAGNOSIS — Z6841 Body Mass Index (BMI) 40.0 and over, adult: Secondary | ICD-10-CM

## 2020-12-14 DIAGNOSIS — E8881 Metabolic syndrome: Secondary | ICD-10-CM | POA: Diagnosis not present

## 2020-12-14 MED ORDER — OZEMPIC (1 MG/DOSE) 4 MG/3ML ~~LOC~~ SOPN: 1.0000 mg | PEN_INJECTOR | SUBCUTANEOUS | 0 refills | Status: DC

## 2020-12-14 NOTE — Progress Notes (Signed)
Chief Complaint:   OBESITY Donna Mendoza is here to discuss her progress with her obesity treatment plan along with follow-up of her obesity related diagnoses.   Today's visit was #: 47 Starting weight: 244 lbs Starting date: 08/03/2019 Today's weight: 200 lbs Today's date: 12/14/2020 Weight change since last visit: 0 Total lbs lost to date: 24 lbs Body mass index is 36.58 kg/m.  Total weight loss percentage to date: -10.71%  Interim History: Donna Mendoza reports that she plans to get to [place] between June 11-17 and will stay until August. Current Meal Plan: the Category 2 Plan for 40% of the time.  Current Exercise Plan: None at this time. Current Anti-Obesity Medications: Ozempic. Side effects: None.  Assessment/Plan:   1. Metabolic syndrome Starting goal: Lose 7-10% of starting weight. She will continue to focus on protein-rich, low simple carbohydrate foods. We reviewed the importance of hydration, regular exercise for stress reduction, and restorative sleep.  We will continue to check lab work every 3 months, with 10% weight loss, or should any other concerns arise. We will increase Ozempic to 1 mg weekly.  - Start Semaglutide, 1 MG/DOSE, (OZEMPIC, 1 MG/DOSE,) 4 MG/3ML SOPN; Inject 1 mg into the skin once a week.  Dispense: 3 mL; Refill: 0  2. Gastroesophageal reflux disease without esophagitis We reviewed the diagnosis of GERD and importance of treatment. We discussed "red flag" symptoms and the importance of follow up if symptoms persisted despite treatment. We reviewed non-pharmacologic management of GERD symptoms: including: caffeine reduction, dietary changes, elevate HOB, NPO after supper, reduction of alcohol intake, tobacco cessation, and weight loss.  Medication: Protonix 40 mg every other day.  3. Situational anxiety Behavior modification techniques were discussed today to help Donna Mendoza deal with her anxiety.    4. At risk for nausea Donna Mendoza was given  approximately 9 minutes of nausea prevention counseling today. Donna Mendoza is at risk for nausea due to her new or current medication. She was encouraged to titrate her medication slowly, make sure to stay hydrated, eat smaller portions throughout the day, and avoid high fat meals.   5. Obesity, current BMI 36.6  Course: Donna Mendoza is currently in the action stage of change. As such, her goal is to continue with weight loss efforts.   Nutrition goals: She has agreed to the Category 2 Plan.   Exercise goals: For substantial health benefits, adults should do at least 150 minutes (2 hours and 30 minutes) a week of moderate-intensity, or 75 minutes (1 hour and 15 minutes) a week of vigorous-intensity aerobic physical activity, or an equivalent combination of moderate- and vigorous-intensity aerobic activity. Aerobic activity should be performed in episodes of at least 10 minutes, and preferably, it should be spread throughout the week.  Behavioral modification strategies: increasing lean protein intake, decreasing simple carbohydrates, increasing vegetables and increasing water intake.  Donna Mendoza has agreed to follow-up with our clinic in 6-8 weeks. She was informed of the importance of frequent follow-up visits to maximize her success with intensive lifestyle modifications for her multiple health conditions.   Objective:   Blood pressure 114/78, pulse 61, temperature 98 F (36.7 C), temperature source Oral, height 5\' 2"  (1.575 m), weight 200 lb (90.7 kg), SpO2 99 %. Body mass index is 36.58 kg/m.  General: Cooperative, alert, well developed, in no acute distress. HEENT: Conjunctivae and lids unremarkable. Cardiovascular: Regular rhythm.  Lungs: Normal work of breathing. Neurologic: No focal deficits.   Lab Results  Component Value Date   CREATININE  0.76 06/08/2020   BUN 11 06/08/2020   NA 140 06/08/2020   K 4.1 06/08/2020   CL 105 06/08/2020   CO2 31 06/08/2020   Lab Results  Component  Value Date   ALT 21 06/08/2020   AST 21 06/08/2020   ALKPHOS 69 06/08/2020   BILITOT 0.5 06/08/2020   Lab Results  Component Value Date   HGBA1C 5.7 06/08/2020   HGBA1C 5.7 (H) 02/28/2020   HGBA1C 5.7 (H) 10/18/2019   HGBA1C 6.0 07/05/2019   HGBA1C 5.9 05/27/2018   Lab Results  Component Value Date   TSH 1.58 06/08/2020   Lab Results  Component Value Date   CHOL 143 06/08/2020   HDL 48.60 06/08/2020   LDLCALC 76 06/08/2020   TRIG 90.0 06/08/2020   CHOLHDL 3 06/08/2020   Lab Results  Component Value Date   WBC 5.9 06/08/2020   HGB 14.2 06/08/2020   HCT 42.9 06/08/2020   MCV 86.6 06/08/2020   PLT 260.0 06/08/2020   Attestation Statements:   Reviewed by clinician on day of visit: allergies, medications, problem list, medical history, surgical history, family history, social history, and previous encounter notes.  Leodis Binet Friedenbach, CMA, am acting as Location manager for [employer], DO.  I have reviewed the above documentation for accuracy and completeness, and I agree with the above. Briscoe Deutscher, DO

## 2020-12-21 ENCOUNTER — Encounter (INDEPENDENT_AMBULATORY_CARE_PROVIDER_SITE_OTHER): Payer: Self-pay | Admitting: Family Medicine

## 2020-12-28 ENCOUNTER — Ambulatory Visit (INDEPENDENT_AMBULATORY_CARE_PROVIDER_SITE_OTHER): Payer: BC Managed Care – PPO | Admitting: Family Medicine

## 2020-12-28 ENCOUNTER — Other Ambulatory Visit: Payer: Self-pay

## 2020-12-28 ENCOUNTER — Encounter (INDEPENDENT_AMBULATORY_CARE_PROVIDER_SITE_OTHER): Payer: Self-pay | Admitting: Family Medicine

## 2020-12-28 VITALS — BP 123/86 | HR 64 | Temp 98.2°F | Ht 62.0 in | Wt 199.0 lb

## 2020-12-28 DIAGNOSIS — F3289 Other specified depressive episodes: Secondary | ICD-10-CM | POA: Diagnosis not present

## 2020-12-28 DIAGNOSIS — E8881 Metabolic syndrome: Secondary | ICD-10-CM

## 2020-12-28 DIAGNOSIS — Z9189 Other specified personal risk factors, not elsewhere classified: Secondary | ICD-10-CM | POA: Diagnosis not present

## 2020-12-28 DIAGNOSIS — E559 Vitamin D deficiency, unspecified: Secondary | ICD-10-CM | POA: Diagnosis not present

## 2020-12-28 DIAGNOSIS — Z6841 Body Mass Index (BMI) 40.0 and over, adult: Secondary | ICD-10-CM

## 2021-01-01 MED ORDER — INSULIN PEN NEEDLE 32G X 4 MM MISC: 1.0000 | 0 refills | Status: DC

## 2021-01-03 NOTE — Progress Notes (Signed)
Chief Complaint:   OBESITY Donna Mendoza is here to discuss her progress with her obesity treatment plan along with follow-up of her obesity related diagnoses.   Today's visit was #: 19  Starting weight: 244 lbs Starting date: 08/03/2019 Today's weight: 199 lbs Today's date: 01/03/2021 Weight change since last visit: -1 lbs Total lbs lost to date: 25 lbs Body mass index is 36.4 kg/m.  Total weight loss percentage to date: 18.44%  Interim History:She will be leaving to go to [place] this week. She plans to be there for two months.  Current Meal Plan: the Category 2 Plan for 60% of the time.  Current Exercise Plan: None at this time. Current Anti-Obesity Medications: Ozempic 4mg /60mL weekly injection. Inject 1mg  under the skin once weekly. Side effects: None noted at this time.  Assessment/Plan:   1. Metabolic syndrome Starting goal: Lose 7-10% of starting weight. She will continue to focus on protein-rich, low simple carbohydrate foods. We reviewed the importance of hydration, regular exercise for stress reduction, and restorative sleep.  We will continue to check lab work every 3 months, with 10% weight loss, or should any other concerns arise.  2. Other depression, with emotional eating  Improving, but not optimized. Medication: None at this time. Motivational interviewing as well as evidence-based interventions for health behavior change were utilized today including the discussion of self monitoring techniques, problem-solving barriers and SMART goal setting techniques.  Behavior modification techniques were discussed today to help deal with emotional/non-hunger eating behaviors.  3. Vitamin D deficiency Improving, but not optimized. Current vitamin D is 49.8ng/mL, tested on 02/28/2020. Optimal goal > 50 ng/dL. Plan: Continue current OTC vitamin D supplementation.  Follow-up for routine testing of Vitamin D, at least 2-3 times per year to avoid over-replacement.  4. At risk for  heart disease Due to Donna Mendoza's current state of health and medical condition(s), she is at a higher risk for heart disease.  This puts the patient at much greater risk to subsequently develop cardiopulmonary conditions that can significantly affect patient's quality of life in a negative manner.    At least 15 minutes were spent on counseling Donna Mendoza about these concerns today. Evidence-based interventions for health behavior change were utilized today including the discussion of self monitoring techniques, problem-solving barriers, and SMART goal setting techniques.  Specifically, regarding patient's less desirable eating habits and patterns, we employed the technique of small changes when Donna Mendoza has not been able to fully commit to her prudent nutritional plan.  5. Obesity with current BMI of 36.5 Course: Donna Mendoza is currently in the action stage of change. As such, her goal is to continue with weight loss efforts.   Nutrition goals: She has agreed to the Category 2 Plan.   Exercise goals:  As is  Behavioral modification strategies: increasing lean protein intake, decreasing simple carbohydrates, increasing vegetables, and increasing water intake.  Donna Mendoza has agreed to follow-up with our clinic in 2 months. She was informed of the importance of frequent follow-up visits to maximize her success with intensive lifestyle modifications for her multiple health conditions.   Objective:   Blood pressure 123/86, pulse 64, temperature 98.2 F (36.8 C), temperature source Oral, height 5\' 2"  (1.575 m), weight 199 lb (90.3 kg), SpO2 97 %. Body mass index is 36.4 kg/m.  General: Cooperative, alert, well developed, in no acute distress. HEENT: Conjunctivae and lids unremarkable. Cardiovascular: Regular rhythm.  Lungs: Normal work of breathing. Neurologic: No focal deficits.   Lab Results  Component  Value Date   CREATININE 0.76 06/08/2020   BUN 11 06/08/2020   NA 140 06/08/2020   K 4.1  06/08/2020   CL 105 06/08/2020   CO2 31 06/08/2020   Lab Results  Component Value Date   ALT 21 06/08/2020   AST 21 06/08/2020   ALKPHOS 69 06/08/2020   BILITOT 0.5 06/08/2020   Lab Results  Component Value Date   HGBA1C 5.7 06/08/2020   HGBA1C 5.7 (H) 02/28/2020   HGBA1C 5.7 (H) 10/18/2019   HGBA1C 6.0 07/05/2019   HGBA1C 5.9 05/27/2018   No results found for: INSULIN Lab Results  Component Value Date   TSH 1.58 06/08/2020   Lab Results  Component Value Date   CHOL 143 06/08/2020   HDL 48.60 06/08/2020   LDLCALC 76 06/08/2020   TRIG 90.0 06/08/2020   CHOLHDL 3 06/08/2020   Lab Results  Component Value Date   WBC 5.9 06/08/2020   HGB 14.2 06/08/2020   HCT 42.9 06/08/2020   MCV 86.6 06/08/2020   PLT 260.0 06/08/2020   Attestation Statements:   Reviewed by clinician on day of visit: allergies, medications, problem list, medical history, surgical history, family history, social history, and previous encounter notes.  I, Noah Mincy,LPN am acting as Location manager for [employer], DO.  I have reviewed the above documentation for accuracy and completeness, and I agree with the above. Briscoe Deutscher, DO

## 2021-02-28 ENCOUNTER — Ambulatory Visit (INDEPENDENT_AMBULATORY_CARE_PROVIDER_SITE_OTHER): Payer: BC Managed Care – PPO | Admitting: Family Medicine

## 2021-02-28 ENCOUNTER — Other Ambulatory Visit: Payer: Self-pay

## 2021-02-28 ENCOUNTER — Encounter (INDEPENDENT_AMBULATORY_CARE_PROVIDER_SITE_OTHER): Payer: Self-pay | Admitting: Family Medicine

## 2021-02-28 ENCOUNTER — Other Ambulatory Visit (HOSPITAL_BASED_OUTPATIENT_CLINIC_OR_DEPARTMENT_OTHER): Payer: Self-pay

## 2021-02-28 VITALS — BP 112/79 | HR 87 | Temp 98.4°F | Ht 62.0 in | Wt 201.0 lb

## 2021-02-28 DIAGNOSIS — E8881 Metabolic syndrome: Secondary | ICD-10-CM

## 2021-02-28 DIAGNOSIS — Z9189 Other specified personal risk factors, not elsewhere classified: Secondary | ICD-10-CM | POA: Diagnosis not present

## 2021-02-28 DIAGNOSIS — R7303 Prediabetes: Secondary | ICD-10-CM | POA: Diagnosis not present

## 2021-02-28 DIAGNOSIS — Z6841 Body Mass Index (BMI) 40.0 and over, adult: Secondary | ICD-10-CM

## 2021-02-28 DIAGNOSIS — E559 Vitamin D deficiency, unspecified: Secondary | ICD-10-CM | POA: Diagnosis not present

## 2021-02-28 DIAGNOSIS — K219 Gastro-esophageal reflux disease without esophagitis: Secondary | ICD-10-CM

## 2021-02-28 MED ORDER — TIRZEPATIDE 5 MG/0.5ML ~~LOC~~ SOAJ
5.0000 mg | SUBCUTANEOUS | 0 refills | Status: DC
Start: 2021-02-28 — End: 2021-03-28
  Filled 2021-02-28: qty 2, 28d supply, fill #1
  Filled 2021-02-28: qty 2, 28d supply, fill #0

## 2021-02-28 MED ORDER — PANTOPRAZOLE SODIUM 40 MG PO TBEC: 40.0000 mg | DELAYED_RELEASE_TABLET | ORAL | 0 refills | Status: DC

## 2021-03-01 LAB — LIPID PANEL
Chol/HDL Ratio: 3.3 ratio (ref 0.0–4.4)
Cholesterol, Total: 170 mg/dL (ref 100–199)
HDL: 52 mg/dL (ref >39)
LDL Chol Calc (NIH): 101 mg/dL — ABNORMAL HIGH (ref 0–99)
Triglycerides: 92 mg/dL (ref 0–149)
VLDL Cholesterol Cal: 17 mg/dL (ref 5–40)

## 2021-03-01 LAB — CBC WITH DIFFERENTIAL/PLATELET
Basophils Absolute: 0 10*3/uL (ref 0.0–0.2)
Basos: 1 %
EOS (ABSOLUTE): 0.2 10*3/uL (ref 0.0–0.4)
Eos: 3 %
Hematocrit: 43.2 % (ref 34.0–46.6)
Hemoglobin: 14 g/dL (ref 11.1–15.9)
Immature Grans (Abs): 0 10*3/uL (ref 0.0–0.1)
Immature Granulocytes: 0 %
Lymphocytes Absolute: 1.3 10*3/uL (ref 0.7–3.1)
Lymphs: 20 %
MCH: 28.5 pg (ref 26.6–33.0)
MCHC: 32.4 g/dL (ref 31.5–35.7)
MCV: 88 fL (ref 79–97)
Monocytes Absolute: 0.5 10*3/uL (ref 0.1–0.9)
Monocytes: 8 %
Neutrophils Absolute: 4.4 10*3/uL (ref 1.4–7.0)
Neutrophils: 68 %
Platelets: 286 10*3/uL (ref 150–450)
RBC: 4.91 x10E6/uL (ref 3.77–5.28)
RDW: 13.8 % (ref 11.7–15.4)
WBC: 6.5 10*3/uL (ref 3.4–10.8)

## 2021-03-01 LAB — COMPREHENSIVE METABOLIC PANEL
ALT: 15 IU/L (ref 0–32)
AST: 22 IU/L (ref 0–40)
Albumin/Globulin Ratio: 1.6 (ref 1.2–2.2)
Albumin: 3.9 g/dL (ref 3.8–4.9)
Alkaline Phosphatase: 76 IU/L (ref 44–121)
BUN/Creatinine Ratio: 20 (ref 9–23)
BUN: 16 mg/dL (ref 6–24)
Bilirubin Total: 0.4 mg/dL (ref 0.0–1.2)
CO2: 23 mmol/L (ref 20–29)
Calcium: 9 mg/dL (ref 8.7–10.2)
Chloride: 105 mmol/L (ref 96–106)
Creatinine, Ser: 0.79 mg/dL (ref 0.57–1.00)
Globulin, Total: 2.5 g/dL (ref 1.5–4.5)
Glucose: 95 mg/dL (ref 65–99)
Potassium: 4.5 mmol/L (ref 3.5–5.2)
Sodium: 141 mmol/L (ref 134–144)
Total Protein: 6.4 g/dL (ref 6.0–8.5)
eGFR: 91 mL/min/{1.73_m2} (ref >59)

## 2021-03-01 LAB — VITAMIN D 25 HYDROXY (VIT D DEFICIENCY, FRACTURES): Vit D, 25-Hydroxy: 73.7 ng/mL (ref 30.0–100.0)

## 2021-03-01 LAB — HEMOGLOBIN A1C
Est. average glucose Bld gHb Est-mCnc: 114 mg/dL
Hgb A1c MFr Bld: 5.6 % (ref 4.8–5.6)

## 2021-03-06 NOTE — Progress Notes (Signed)
Chief Complaint:   OBESITY Donna Mendoza is here to discuss her progress with her obesity treatment plan along with follow-up of her obesity related diagnoses.   Today's visit was #: 20 Starting weight: 244 lbs Starting date: 08/12/2019 Today's weight: 201 lbs Today's date: 02/29/2020 Weight change since last visit: +2 lbs Total lbs lost to date: 23 lbs Body mass index is 36.76 kg/m.  Total weight loss percentage to date: -17.62%  Current Meal Plan: the Category 2 Plan for 30% of the time.  Current Exercise Plan: None at this time Current Anti-Obesity Medications: Ozempic 1 mg subcutaneously weekly. Side effects: None.  Interim History: Chrissa enjoyed her month in [place]. She reports a decrease in sleep since coming back and an increase in work stress. She also reports she is unable to sleep when her partner snores.  Assessment/Plan:   1. Prediabetes At goal. Goal is HgbA1c < 5.7.  Medication: Ozempic 1 mg subcutaneously weekly.    Plan:  She will continue to focus on protein-rich, low simple carbohydrate foods. We reviewed the importance of hydration, regular exercise for stress reduction, and restorative sleep. We will check labs today  Lab Results  Component Value Date   HGBA1C 5.7 06/08/2020   - CBC with Differential/Platelet - Hemoglobin A1c  2. Vitamin D deficiency Not at goal.   Plan: Continue current OTC vitamin D supplementation. We will check labs today.  Lab Results  Component Value Date   VD25OH 49.8 02/28/2020   VD25OH 51.8 09/29/2019   - VITAMIN D 25 Hydroxy (Vit-D Deficiency, Fractures)  3. Metabolic syndrome Starting goal MET: Lose 7-10% of starting weight.   Plan: Stop Ozempic and start Mounjaro 5 mg subcutaneously weekly. We will check labs today. She will continue to focus on protein-rich, low simple carbohydrate foods. We reviewed the importance of hydration, regular exercise for stress reduction, and restorative sleep.  We will continue  to check lab work every 3 months, with 10% weight loss, or should any other concerns arise.   - Comprehensive metabolic panel - Lipid panel - Start tirzepatide [facility]) 5 MG/0.5ML Pen; Inject 5 mg into the skin once a week.  Dispense: 6 mL; Refill: 0  4. Gastroesophageal reflux disease without esophagitis The current medical regimen is effective;  continue present plan and medications.  - Refill pantoprazole (PROTONIX) 40 MG tablet; Take 1 tablet (40 mg total) by mouth every other day.  Dispense: 90 tablet; Refill: 0  5. At risk for deficient intake of food Keniah was given extensive education and counseling today of more than 8 minutes on risks associated with deficient food intake.  Counseled her on the importance of following our prescribed meal plan and eating adequate amounts of protein.  Discussed with Shakirah that inadequate food intake over longer periods of time can slow their metabolism down significantly.   6. Obesity with current BMI of 36.9 Course: Calysta is currently in the action stage of change. As such, her goal is to continue with weight loss efforts.   Nutrition goals: She has agreed to the Category 2 Plan.   Exercise goals: For substantial health benefits, adults should do at least 150 minutes (2 hours and 30 minutes) a week of moderate-intensity, or 75 minutes (1 hour and 15 minutes) a week of vigorous-intensity aerobic physical activity, or an equivalent combination of moderate- and vigorous-intensity aerobic activity. Aerobic activity should be performed in episodes of at least 10 minutes, and preferably, it should be spread throughout the week.  Behavioral modification strategies: increasing lean protein intake, decreasing simple carbohydrates, and increasing vegetables.  Idalie has agreed to follow-up with our clinic in 4 weeks. She was informed of the importance of frequent follow-up visits to maximize her success with intensive lifestyle modifications for  her multiple health conditions.   Hidaya was informed we would discuss her lab results at her next visit unless there is a critical issue that needs to be addressed sooner. Diva agreed to keep her next visit at the agreed upon time to discuss these results.  Objective:   Blood pressure 112/79, pulse 87, temperature 98.4 F (36.9 C), temperature source Oral, height _0  (1.575 m), weight 201 lb (91.2 kg), SpO2 97 %. Body mass index is 36.76 kg/m.  General: Cooperative, alert, well developed, in no acute distress. HEENT: Conjunctivae and lids unremarkable. Cardiovascular: Regular rhythm.  Lungs: Normal work of breathing. Neurologic: No focal deficits.   Lab Results  Component Value Date   CREATININE 0.76 06/08/2020   BUN 11 06/08/2020   NA 140 06/08/2020   K 4.1 06/08/2020   CL 105 06/08/2020   CO2 31 06/08/2020   Lab Results  Component Value Date   ALT 21 06/08/2020   AST 21 06/08/2020   ALKPHOS 69 06/08/2020   BILITOT 0.5 06/08/2020   Lab Results  Component Value Date   HGBA1C 5.7 06/08/2020   HGBA1C 5.7 (H) 02/28/2020   HGBA1C 5.7 (H) 10/18/2019   HGBA1C 6.0 07/05/2019   Lab Results  Component Value Date   TSH 1.58 06/08/2020   Lab Results  Component Value Date   CHOL 203 (H) 02/28/2020   HDL 48.6 06/08/2020   LDLCALC 128 (H) 02/28/2020   TRIG 90 06/08/2020   CHOLHDL 3 06/08/2020   Lab Results  Component Value Date   VD25OH 49.8 02/28/2020   VD25OH 51.8 09/29/2019   Lab Results  Component Value Date   WBC 509 06/08/2020   HGB 14.2 06/08/2020   HCT 42.9 06/08/2020   MCV 86.6 06/08/2020   PLT 260 06/08/2020   Attestation Statements:   Reviewed by clinician on day of visit: allergies, medications, problem list, medical history, surgical history, family history, social history, and previous encounter notes.  Leodis Binet Friedenbach, CMA, am acting as Location manager for [employer], DO.  I have reviewed the above documentation for accuracy  and completeness, and I agree with the above. Briscoe Deutscher, DO

## 2021-03-28 ENCOUNTER — Other Ambulatory Visit (HOSPITAL_BASED_OUTPATIENT_CLINIC_OR_DEPARTMENT_OTHER): Payer: Self-pay

## 2021-03-28 ENCOUNTER — Ambulatory Visit (INDEPENDENT_AMBULATORY_CARE_PROVIDER_SITE_OTHER): Payer: BC Managed Care – PPO | Admitting: Family Medicine

## 2021-03-28 ENCOUNTER — Other Ambulatory Visit: Payer: Self-pay

## 2021-03-28 ENCOUNTER — Encounter (INDEPENDENT_AMBULATORY_CARE_PROVIDER_SITE_OTHER): Payer: Self-pay | Admitting: Family Medicine

## 2021-03-28 VITALS — BP 107/74 | HR 71 | Temp 98.3°F | Ht 62.0 in | Wt 195.0 lb

## 2021-03-28 DIAGNOSIS — L304 Erythema intertrigo: Secondary | ICD-10-CM | POA: Diagnosis not present

## 2021-03-28 DIAGNOSIS — R632 Polyphagia: Secondary | ICD-10-CM | POA: Diagnosis not present

## 2021-03-28 DIAGNOSIS — Z6841 Body Mass Index (BMI) 40.0 and over, adult: Secondary | ICD-10-CM

## 2021-03-28 DIAGNOSIS — Z9189 Other specified personal risk factors, not elsewhere classified: Secondary | ICD-10-CM | POA: Diagnosis not present

## 2021-03-28 MED ORDER — KETOCONAZOLE 2 % EX CREA
1.0000 | TOPICAL_CREAM | Freq: Every day | CUTANEOUS | 0 refills | Status: AC
Start: 2021-03-28 — End: ?
  Filled 2021-03-28: qty 15, 7d supply, fill #0

## 2021-03-28 MED ORDER — TIRZEPATIDE 5 MG/0.5ML ~~LOC~~ SOAJ
5.0000 mg | SUBCUTANEOUS | 0 refills | Status: DC
Start: 2021-03-28 — End: 2021-05-08
  Filled 2021-03-28: qty 2, 28d supply, fill #0
  Filled 2021-04-20: qty 2, 28d supply, fill #1

## 2021-03-29 ENCOUNTER — Other Ambulatory Visit (HOSPITAL_BASED_OUTPATIENT_CLINIC_OR_DEPARTMENT_OTHER): Payer: Self-pay

## 2021-03-29 NOTE — Progress Notes (Signed)
Chief Complaint:   OBESITY Donna Mendoza is here to discuss her progress with her obesity treatment plan along with follow-up of her obesity related diagnoses.   Today's visit was #: 21 Starting weight: 244 lbs Starting date: 08/12/2019 Today's weight: 195 lbs Today's date: 03/28/2021 Weight change since last visit: 6 lbs Total lbs lost to date: 49 lbs Body mass index is 35.67 kg/m.  Total weight loss percentage to date: -20.08%  Current Meal Plan: the Category 2 Plan for 60% of the time.  Current Exercise Plan: Walking for 30 minutes 2-3 times per week. Current Anti-Obesity Medications:  Mounjaro 5 mg subcutaneously weekly. Side effects: None.  Interim History:  Donna Mendoza and I reviewed her labs from 02/28/2021 - at goal.  She is tolerating her medication well.  She has been drinking [employer] protein drinks.  Assessment/Plan:   1. Polyphagia Controlled. Current treatment: Mounjaro 5 mg subcutaneously weekly. She will continue to focus on protein-rich, low simple carbohydrate foods. We reviewed the importance of hydration, regular exercise for stress reduction, and restorative sleep.  - Refill tirzepatide (MOUNJARO) 5 MG/0.5ML Pen; Inject 5 mg into the skin once a week.  Dispense: 6 mL; Refill: 0  2. Intertrigo Will refill ketoconazole today, as per below.  - Refill ketoconazole (NIZORAL) 2 % cream; Apply 1 application topically daily.  Dispense: 15 g; Refill: 0  3. At risk for heart disease Due to Donna Mendoza's current state of health and medical condition(s), she is at a higher risk for heart disease - improving.  This puts the patient at much greater risk to subsequently develop cardiopulmonary conditions that can significantly affect patient's quality of life in a negative manner.    At least 8 minutes were spent on counseling Donna Mendoza about these concerns today. Evidence-based interventions for health behavior change were utilized today including the discussion of self monitoring  techniques, problem-solving barriers, and SMART goal setting techniques.  Specifically, regarding patient's less desirable eating habits and patterns, we employed the technique of small changes when Donna Mendoza has not been able to fully commit to her prudent nutritional plan.  4. Obesity with current BMI of 35.7  Course: Donna Mendoza is currently in the action stage of change. As such, her goal is to continue with weight loss efforts.   Nutrition goals: She has agreed to the Category 2 Plan.   Exercise goals:  As is.  Behavioral modification strategies: increasing lean protein intake, decreasing simple carbohydrates, increasing vegetables, and increasing water intake.  Donna Mendoza has agreed to follow-up with our clinic in 6-8 weeks. She was informed of the importance of frequent follow-up visits to maximize her success with intensive lifestyle modifications for her multiple health conditions.   Objective:   Blood pressure 107/74, pulse 71, temperature 98.3 F (36.8 C), temperature source Oral, height '5\' 2"'$  (1.575 m), weight 195 lb (88.5 kg), SpO2 97 %. Body mass index is 35.67 kg/m.  General: Cooperative, alert, well developed, in no acute distress. HEENT: Conjunctivae and lids unremarkable. Cardiovascular: Regular rhythm.  Lungs: Normal work of breathing. Neurologic: No focal deficits.   Lab Results  Component Value Date   CREATININE 0.79 02/28/2021   BUN 16 02/28/2021   NA 141 02/28/2021   K 4.5 02/28/2021   CL 105 02/28/2021   CO2 23 02/28/2021   Lab Results  Component Value Date   ALT 15 02/28/2021   AST 22 02/28/2021   ALKPHOS 76 02/28/2021   BILITOT 0.4 02/28/2021   Lab Results  Component Value Date  HGBA1C 5.6 02/28/2021   HGBA1C 5.7 06/08/2020   HGBA1C 5.7 (H) 02/28/2020   HGBA1C 5.7 (H) 10/18/2019   HGBA1C 6.0 07/05/2019   Lab Results  Component Value Date   TSH 1.58 06/08/2020   Lab Results  Component Value Date   CHOL 170 02/28/2021   HDL 52 02/28/2021    LDLCALC 101 (H) 02/28/2021   TRIG 92 02/28/2021   CHOLHDL 3.3 02/28/2021   Lab Results  Component Value Date   VD25OH 73.7 02/28/2021   VD25OH 49.8 02/28/2020   VD25OH 51.8 09/29/2019   Lab Results  Component Value Date   WBC 6.5 02/28/2021   HGB 14.0 02/28/2021   HCT 43.2 02/28/2021   MCV 88 02/28/2021   PLT 286 02/28/2021   Attestation Statements:   Reviewed by clinician on day of visit: allergies, medications, problem list, medical history, surgical history, family history, social history, and previous encounter notes.  I, Water quality scientist, CMA, am acting as transcriptionist for Briscoe Deutscher, DO  I have reviewed the above documentation for accuracy and completeness, and I agree with the above. Briscoe Deutscher, DO

## 2021-04-20 ENCOUNTER — Encounter (HOSPITAL_BASED_OUTPATIENT_CLINIC_OR_DEPARTMENT_OTHER): Payer: Self-pay | Admitting: Pharmacist

## 2021-04-20 ENCOUNTER — Other Ambulatory Visit (HOSPITAL_BASED_OUTPATIENT_CLINIC_OR_DEPARTMENT_OTHER): Payer: Self-pay

## 2021-04-23 ENCOUNTER — Other Ambulatory Visit (HOSPITAL_BASED_OUTPATIENT_CLINIC_OR_DEPARTMENT_OTHER): Payer: Self-pay

## 2021-04-26 DIAGNOSIS — Z1231 Encounter for screening mammogram for malignant neoplasm of breast: Secondary | ICD-10-CM | POA: Diagnosis not present

## 2021-04-26 LAB — HM MAMMOGRAPHY

## 2021-05-08 ENCOUNTER — Ambulatory Visit (INDEPENDENT_AMBULATORY_CARE_PROVIDER_SITE_OTHER): Payer: BC Managed Care – PPO | Admitting: Family Medicine

## 2021-05-08 ENCOUNTER — Encounter (INDEPENDENT_AMBULATORY_CARE_PROVIDER_SITE_OTHER): Payer: Self-pay | Admitting: Family Medicine

## 2021-05-08 ENCOUNTER — Other Ambulatory Visit (HOSPITAL_BASED_OUTPATIENT_CLINIC_OR_DEPARTMENT_OTHER): Payer: Self-pay

## 2021-05-08 ENCOUNTER — Other Ambulatory Visit: Payer: Self-pay

## 2021-05-08 VITALS — BP 117/81 | HR 69 | Temp 98.3°F | Ht 62.0 in | Wt 190.0 lb

## 2021-05-08 DIAGNOSIS — Z6841 Body Mass Index (BMI) 40.0 and over, adult: Secondary | ICD-10-CM

## 2021-05-08 DIAGNOSIS — R632 Polyphagia: Secondary | ICD-10-CM

## 2021-05-08 DIAGNOSIS — R1013 Epigastric pain: Secondary | ICD-10-CM | POA: Diagnosis not present

## 2021-05-08 MED ORDER — PANTOPRAZOLE SODIUM 40 MG PO TBEC: 40.0000 mg | DELAYED_RELEASE_TABLET | ORAL | 3 refills | Status: DC

## 2021-05-08 MED ORDER — TIRZEPATIDE 5 MG/0.5ML ~~LOC~~ SOAJ
5.0000 mg | SUBCUTANEOUS | 0 refills | Status: DC
  Filled 2021-05-08: qty 6, 84d supply, fill #0
  Filled 2021-05-21: qty 2, 28d supply, fill #0
  Filled 2021-05-24: qty 2, 28d supply, fill #1
  Filled ????-??-??: fill #1

## 2021-05-10 NOTE — Progress Notes (Signed)
Chief Complaint:   OBESITY Donna Mendoza is here to discuss her progress with her obesity treatment plan along with follow-up of her obesity related diagnoses. See Medical Weight Management Flowsheet for complete bioelectrical impedance results.  Today's visit was #: 22 Starting weight: 244 lbs Starting date: 08/12/2019 Weight change since last visit: 5 lbs Total lbs lost to date: 54 lbs Total weight loss percentage to date: -22.13%  Nutrition Plan: Category 2 Meal Plan for 60% of the time. Activity: None. Anti-obesity medications: Mounjaro 5 mg subcutaneously weekly. Reported side effects: None.  Interim History: Donna Mendoza is doing well.  She says she has slightly more dyspepsia that she notes with red meat intake. Plan:  Check labs 2 visits from now.  Assessment/Plan:   1. Dyspepsia Donna Mendoza is taking pantoprazole 40 mg every other day.  The current medical regimen is effective;  continue present plan and medications.  We reviewed the diagnosis of dyspepsia and importance of treatment. We discussed "red flag" symptoms and the importance of follow up if symptoms persisted despite treatment. We reviewed non-pharmacologic management of dyspepsia symptoms: including: caffeine reduction, dietary changes, elevate HOB, NPO after supper, reduction of alcohol intake, tobacco cessation, and weight loss.  - Refill pantoprazole (PROTONIX) 40 MG tablet; Take 1 tablet (40 mg total) by mouth every other day.  Dispense: 90 tablet; Refill: 3  2. Polyphagia Controlled. Current treatment: Mounjaro 5 mg subcutaneously weekly.    Plan:  Refill Mounjaro 5 mg subcutaneously weekly, as per below.  She will continue to focus on protein-rich, low simple carbohydrate foods. We reviewed the importance of hydration, regular exercise for stress reduction, and restorative sleep.  - Refill tirzepatide (MOUNJARO) 5 MG/0.5ML Pen; Inject 5 mg into the skin once a week.  Dispense: 6 mL; Refill: 0  3. Obesity with  current BMI of 34.9  Course: Donna Mendoza is currently in the action stage of change. As such, her goal is to continue with weight loss efforts.   Nutrition goals: She has agreed to practicing portion control and making smarter food choices, such as increasing vegetables and decreasing simple carbohydrates.   Exercise goals:  Increase NEAT.  Behavioral modification strategies: increasing lean protein intake, decreasing simple carbohydrates, increasing vegetables, and increasing water intake.  Donna Mendoza has agreed to follow-up with our clinic in 4 weeks. She was informed of the importance of frequent follow-up visits to maximize her success with intensive lifestyle modifications for her multiple health conditions.   Objective:   Blood pressure 117/81, pulse 69, temperature 98.3 F (36.8 C), temperature source Oral, height 5\' 2"  (1.575 m), weight 190 lb (86.2 kg), SpO2 99 %. Body mass index is 34.75 kg/m.  General: Cooperative, alert, well developed, in no acute distress. HEENT: Conjunctivae and lids unremarkable. Cardiovascular: Regular rhythm.  Lungs: Normal work of breathing. Neurologic: No focal deficits.   Lab Results  Component Value Date   CREATININE 0.79 02/28/2021   BUN 16 02/28/2021   NA 141 02/28/2021   K 4.5 02/28/2021   CL 105 02/28/2021   CO2 23 02/28/2021   Lab Results  Component Value Date   ALT 15 02/28/2021   AST 22 02/28/2021   ALKPHOS 76 02/28/2021   BILITOT 0.4 02/28/2021   Lab Results  Component Value Date   HGBA1C 5.6 02/28/2021   HGBA1C 5.7 06/08/2020   HGBA1C 5.7 (H) 02/28/2020   HGBA1C 5.7 (H) 10/18/2019   HGBA1C 6.0 07/05/2019   Lab Results  Component Value Date   TSH 1.58 06/08/2020  Lab Results  Component Value Date   CHOL 170 02/28/2021   HDL 52 02/28/2021   LDLCALC 101 (H) 02/28/2021   TRIG 92 02/28/2021   CHOLHDL 3.3 02/28/2021   Lab Results  Component Value Date   VD25OH 73.7 02/28/2021   VD25OH 49.8 02/28/2020   VD25OH 51.8  09/29/2019   Lab Results  Component Value Date   WBC 6.5 02/28/2021   HGB 14.0 02/28/2021   HCT 43.2 02/28/2021   MCV 88 02/28/2021   PLT 286 02/28/2021   Attestation Statements:   Reviewed by clinician on day of visit: allergies, medications, problem list, medical history, surgical history, family history, social history, and previous encounter notes.  I, Water quality scientist, CMA, am acting as transcriptionist for Briscoe Deutscher, DO  I have reviewed the above documentation for accuracy and completeness, and I agree with the above. -  Briscoe Deutscher, DO, MS, FAAFP, DABOM - Family and Bariatric Medicine.

## 2021-05-11 DIAGNOSIS — Z01419 Encounter for gynecological examination (general) (routine) without abnormal findings: Secondary | ICD-10-CM | POA: Diagnosis not present

## 2021-05-11 DIAGNOSIS — Z6835 Body mass index (BMI) 35.0-35.9, adult: Secondary | ICD-10-CM | POA: Diagnosis not present

## 2021-05-21 ENCOUNTER — Other Ambulatory Visit (HOSPITAL_BASED_OUTPATIENT_CLINIC_OR_DEPARTMENT_OTHER): Payer: Self-pay

## 2021-05-24 ENCOUNTER — Other Ambulatory Visit (HOSPITAL_BASED_OUTPATIENT_CLINIC_OR_DEPARTMENT_OTHER): Payer: Self-pay

## 2021-06-05 ENCOUNTER — Other Ambulatory Visit: Payer: Self-pay

## 2021-06-05 ENCOUNTER — Ambulatory Visit (INDEPENDENT_AMBULATORY_CARE_PROVIDER_SITE_OTHER): Payer: BC Managed Care – PPO | Admitting: Family Medicine

## 2021-06-05 ENCOUNTER — Encounter (INDEPENDENT_AMBULATORY_CARE_PROVIDER_SITE_OTHER): Payer: Self-pay | Admitting: Family Medicine

## 2021-06-05 ENCOUNTER — Other Ambulatory Visit (HOSPITAL_BASED_OUTPATIENT_CLINIC_OR_DEPARTMENT_OTHER): Payer: Self-pay

## 2021-06-05 ENCOUNTER — Other Ambulatory Visit (INDEPENDENT_AMBULATORY_CARE_PROVIDER_SITE_OTHER): Payer: Self-pay | Admitting: Family Medicine

## 2021-06-05 VITALS — BP 133/76 | HR 68 | Temp 97.9°F | Ht 62.0 in | Wt 186.0 lb

## 2021-06-05 DIAGNOSIS — R632 Polyphagia: Secondary | ICD-10-CM

## 2021-06-05 DIAGNOSIS — E8881 Metabolic syndrome: Secondary | ICD-10-CM

## 2021-06-05 DIAGNOSIS — R7303 Prediabetes: Secondary | ICD-10-CM

## 2021-06-05 DIAGNOSIS — Z6841 Body Mass Index (BMI) 40.0 and over, adult: Secondary | ICD-10-CM

## 2021-06-05 MED ORDER — TIRZEPATIDE 5 MG/0.5ML ~~LOC~~ SOAJ
5.0000 mg | SUBCUTANEOUS | 2 refills | Status: DC
  Filled 2021-06-05: qty 2, 28d supply, fill #0
  Filled 2021-06-07: qty 6, 84d supply, fill #0
  Filled 2021-06-13: qty 2, 28d supply, fill #0
  Filled 2021-06-20 – 2021-07-05 (×3): qty 2, 28d supply, fill #1

## 2021-06-05 MED ORDER — TIRZEPATIDE 5 MG/0.5ML ~~LOC~~ SOAJ: 5.0000 mg | SUBCUTANEOUS | 2 refills | Status: DC

## 2021-06-06 NOTE — Progress Notes (Signed)
Chief Complaint:   OBESITY Donna Mendoza is here to discuss her progress with her obesity treatment plan along with follow-up of her obesity related diagnoses. See Medical Weight Management Flowsheet for complete bioelectrical impedance results.  Today's visit was #: 23 Starting weight: 224 lbs Starting date: 08/12/2019 Weight change since last visit: 4 lbs Total lbs lost to date: 38 lbs Total weight loss percentage to date: -16.96%  Nutrition Plan: Category 2 Meal Plan for 60% of the time.  Activity: None. Anti-obesity medications: Mounjaro 5 mg subcutaneously weekly. Reported side effects: None.  Interim History: Donna Mendoza is down 38 pounds today.  She says she is happy with her weight loss and her appetite is controlled.  She says she is getting in her protein.  Assessment/Plan:   1. Polyphagia Controlled. Current treatment: Mounjaro 5 mg subcutaneously weekly.    Plan:  She will continue Mounjaro 5 mg subcutaneously weekly. She will continue to focus on protein-rich, low simple carbohydrate foods. We reviewed the importance of hydration, regular exercise for stress reduction, and restorative sleep.  2. Metabolic syndrome Starting goal: Lose 7-10% of starting weight. She will continue to focus on protein-rich, low simple carbohydrate foods. We reviewed the importance of hydration, regular exercise for stress reduction, and restorative sleep.  We will continue to check lab work every 3 months, with 10% weight loss, or should any other concerns arise.  3. Prediabetes Controlled. Goal is HgbA1c < 5.7.  Medication: Mounjaro 5 mg subcutaneously weekly.    Plan:  Continue Mounjaro 5 mg subcutaneously weekly.  Will refill today. She will continue to focus on protein-rich, low simple carbohydrate foods. We reviewed the importance of hydration, regular exercise for stress reduction, and restorative sleep.   Lab Results  Component Value Date   HGBA1C 5.6 02/28/2021   4. Obesity BMI  today is 34  Course: [name] is currently in the action stage of change. As such, her goal is to continue with weight loss efforts.   Nutrition goals: She has agreed to the Category 2 Plan.   Exercise goals:  Increase NEAT.  Behavioral modification strategies: increasing lean protein intake, decreasing simple carbohydrates, and increasing vegetables.  [name] has agreed to follow-up with our clinic in 4 weeks. She was informed of the importance of frequent follow-up visits to maximize her success with intensive lifestyle modifications for her multiple health conditions.   Objective:   Blood pressure 133/76, pulse 68, temperature 97.9 F (36.6 C), temperature source Oral, height 5\' 2"  (1.575 m), weight 186 lb (84.4 kg), SpO2 97 %. Body mass index is 34.02 kg/m.  General: Cooperative, alert, well developed, in no acute distress. HEENT: Conjunctivae and lids unremarkable. Cardiovascular: Regular rhythm.  Lungs: Normal work of breathing. Neurologic: No focal deficits.   Lab Results  Component Value Date   CREATININE 0.79 02/28/2021   BUN 16 02/28/2021   NA 141 02/28/2021   K 4.5 02/28/2021   CL 105 02/28/2021   CO2 23 02/28/2021   Lab Results  Component Value Date   ALT 15 02/28/2021   AST 22 02/28/2021   ALKPHOS 76 02/28/2021   BILITOT 0.4 02/28/2021   Lab Results  Component Value Date   HGBA1C 5.6 02/28/2021   HGBA1C 5.7 06/08/2020   HGBA1C 5.7 (H) 02/28/2020   HGBA1C 5.7 (H) 10/18/2019   HGBA1C 6.0 07/05/2019   Lab Results  Component Value Date   TSH 1.58 06/08/2020   Lab Results  Component Value Date   CHOL 170 02/28/2021  HDL 52 02/28/2021   LDLCALC 101 (H) 02/28/2021   TRIG 92 02/28/2021   CHOLHDL 3.3 02/28/2021   Lab Results  Component Value Date   VD25OH 73.7 02/28/2021   VD25OH 49.8 02/28/2020   VD25OH 51.8 09/29/2019   Lab Results  Component Value Date   WBC 6.5 02/28/2021   HGB 14.0 02/28/2021   HCT 43.2 02/28/2021   MCV 88 02/28/2021    PLT 286 02/28/2021   Attestation Statements:   Reviewed by clinician on day of visit: allergies, medications, problem list, medical history, surgical history, family history, social history, and previous encounter notes.  I, Water quality scientist, CMA, am acting as transcriptionist for Briscoe Deutscher, DO  I have reviewed the above documentation for accuracy and completeness, and I agree with the above. -  Briscoe Deutscher, DO, MS, FAAFP, DABOM - Family and Bariatric Medicine.

## 2021-06-06 NOTE — Telephone Encounter (Signed)
Prior authorization has been started for [facility]. Will notify patient and provider one response is received.

## 2021-06-07 ENCOUNTER — Other Ambulatory Visit (HOSPITAL_BASED_OUTPATIENT_CLINIC_OR_DEPARTMENT_OTHER): Payer: Self-pay

## 2021-06-13 ENCOUNTER — Other Ambulatory Visit (HOSPITAL_BASED_OUTPATIENT_CLINIC_OR_DEPARTMENT_OTHER): Payer: Self-pay

## 2021-06-18 ENCOUNTER — Ambulatory Visit (INDEPENDENT_AMBULATORY_CARE_PROVIDER_SITE_OTHER): Payer: BC Managed Care – PPO | Admitting: Family Medicine

## 2021-06-18 ENCOUNTER — Encounter (INDEPENDENT_AMBULATORY_CARE_PROVIDER_SITE_OTHER): Payer: Self-pay

## 2021-06-18 ENCOUNTER — Telehealth (INDEPENDENT_AMBULATORY_CARE_PROVIDER_SITE_OTHER): Payer: Self-pay | Admitting: Family Medicine

## 2021-06-18 VITALS — BP 130/90 | Ht 62.0 in | Wt 184.0 lb

## 2021-06-18 DIAGNOSIS — M722 Plantar fascial fibromatosis: Secondary | ICD-10-CM | POA: Diagnosis not present

## 2021-06-18 NOTE — Patient Instructions (Addendum)
Today we discussed your left foot pain.  We are providing some exercises to use to help maintain strength and flexibility of your left foot to help minimize the chances of plantar fasciitis becoming worse.  I recommend against a steroid injection at this time due to the number of these you have had and the possibility of an adverse effect from it.  I would like to see you back as needed if the pain does not continue to stay under good control.

## 2021-06-18 NOTE — Progress Notes (Addendum)
    SUBJECTIVE:   CHIEF COMPLAINT / HPI:   Planter fasciitis left foot: Patient states that she is coming in for possibility of a injection for her Plantar fasciitis.  She states that she had a previous shot a couple of years ago and got a lot of benefit from it.  She states she has had 4 or 5 of these over her lifetime.  She states that she gets some heel pain of her left foot that happens 2-3 times per day and resolves after a few minutes.  She states this pain is not limiting her ability to ambulate or do her daily activities.  She states that these short bouts of discomfort have been occurring more often which is why she is presenting today.  She has been wearing orthotics and is tried some physical therapy exercises in the past.  She is not currently doing physical therapy exercises however.  She states the pain is not aggravating her right now in the office today.   OBJECTIVE:   BP 130/90   Ht 5\' 2"  (1.575 m)   Wt 184 lb (83.5 kg)   BMI 33.65 kg/m    General: NAD, pleasant, able to participate in exam MSK: Left ankle with no obvious swelling or bony abnormalities.  No pain with palpation of the distal calf, Achilles tendon, calcaneus, midfoot, or digits.  FROM with 5/5 strength all directions.  Negative calcaneal squeeze, peripheral pulses 2+ bilaterally  ASSESSMENT/PLAN:    Planter fasciitis of left foot: No significant pain on palpation with physical exam today.  She has been wearing orthotics and has done physical therapy exercises in the past.  She has previously received 4-5 injections per her recollection for Plantar fasciitis of this left foot.  Most recent she states was 1 to 2 years ago.  Her discomfort is occurring 2-3 times per day and is limited to a few minutes when it occurs.  It is not limiting her ability to do her daily activities or exercise.  Discussed case with Dr. Barbaraann Barthel who is in agreement that a steroid injection was probably not ideal for her at this time as the  pain is only intermittent and not causing disruption to her day-to-day activities.  We discussed this with her and recommended continue to work on rehabilitative exercises as well as continued use of orthotics.  She is going to follow-up as needed if the discomfort does not stay under good control.  Lurline Del, [place]

## 2021-06-18 NOTE — Telephone Encounter (Signed)
Prior authorization denied for [facility]. Patient sent message in [place].

## 2021-06-20 ENCOUNTER — Other Ambulatory Visit (HOSPITAL_BASED_OUTPATIENT_CLINIC_OR_DEPARTMENT_OTHER): Payer: Self-pay

## 2021-06-29 ENCOUNTER — Ambulatory Visit: Payer: BC Managed Care – PPO

## 2021-06-29 ENCOUNTER — Ambulatory Visit (INDEPENDENT_AMBULATORY_CARE_PROVIDER_SITE_OTHER): Payer: BC Managed Care – PPO | Admitting: Physician Assistant

## 2021-06-29 ENCOUNTER — Other Ambulatory Visit: Payer: Self-pay

## 2021-06-29 ENCOUNTER — Encounter: Payer: Self-pay | Admitting: Physician Assistant

## 2021-06-29 ENCOUNTER — Ambulatory Visit: Payer: BC Managed Care – PPO | Attending: Internal Medicine

## 2021-06-29 ENCOUNTER — Other Ambulatory Visit (HOSPITAL_BASED_OUTPATIENT_CLINIC_OR_DEPARTMENT_OTHER): Payer: Self-pay

## 2021-06-29 VITALS — BP 119/85 | HR 85 | Temp 98.4°F | Ht 62.0 in | Wt 186.4 lb

## 2021-06-29 DIAGNOSIS — M79662 Pain in left lower leg: Secondary | ICD-10-CM

## 2021-06-29 DIAGNOSIS — Z23 Encounter for immunization: Secondary | ICD-10-CM

## 2021-06-29 MED ORDER — PFIZER COVID-19 VAC BIVALENT 30 MCG/0.3ML IM SUSP
INTRAMUSCULAR | 0 refills | Status: DC
  Filled 2021-06-29: qty 0.3, 1d supply, fill #0

## 2021-06-29 NOTE — Progress Notes (Signed)
   Covid-19 Vaccination Clinic  Name:  Faythe Heitzenrater    MRN: [id number] DOB: Sep 17, 1970  06/29/2021  Ms. Glasco was observed post Covid-19 immunization for 15 minutes without incident. She was provided with Vaccine Information Sheet and instruction to access the V-Safe system.   Ms. Pyles was instructed to call 911 with any severe reactions post vaccine: Difficulty breathing  Swelling of face and throat  A fast heartbeat  A bad rash all over body  Dizziness and weakness   Immunizations Administered     Name Date Dose VIS Date Route   Pfizer Covid-19 Vaccine Bivalent Booster 06/29/2021  2:44 PM 0.3 mL 03/21/2021 Intramuscular   Manufacturer: [place]   Lot: [id number]   [place]: [phone]

## 2021-06-29 NOTE — Patient Instructions (Signed)
It was great to see you!  Please stay near your phone -- we are going to call you to schedule your STAT ultrasound.  If any worsening symptoms in the meantime (such as chest pain or shortness of breath -- please proceed to ER.)  I am also giving you a sample of xarelto 15 mg. DO NOT START this until I call you.  Take care,  Inda Coke PA-C

## 2021-06-29 NOTE — Progress Notes (Addendum)
Donna Mendoza is a 50 y.o. female here for a follow up referred by former PCP, Raiford Noble, PA.   History of Present Illness:   Chief Complaint  Patient presents with   Transfer of Care    Possible return of blood clot in left calf     HPI  Left Calf Pain At this time Donna Mendoza is concerned that this could be a return of DVT. According to pt, she has been feeling a familiar tightness in a specific area of her calf for a couple of weeks. Although her job consists of sitting for long periods of time, she is not regularly wearing compression socks/panty hose. Denies CP, SOB, recent trauma, prolonged immobilization, or recent travel.   Hx of DVT Back on 1128/2015, Donna Mendoza presented to the ED with c/o left calf pain for the past seven days, initially noticing it upon waking up a week ago. Pain was described as a soreness  that is worse in the mornings and rated 6-7 out of 10. At this time she denied any warmth/swelling in calf, CP, SOB, hemoptysis, recent injuries, or injury.   Following the completion of a US venous image of her calf, she was found to have DVT. Following this visit, she followed up with PCP, Raiford Noble and shortly began taking Xarelto 15 mg twice daily for 21 days. Following this regimen she was cleared to discontinue use of Xarelto. It was believed that the estrogen containing OCPs that she was taking at the time, contributed to her DVT.   Past Medical History:  Diagnosis Date   Allergy    seasonal   Family history of cancer    Family history of lung cancer    Gallbladder problem    GERD (gastroesophageal reflux disease)    Heartburn    History of chicken pox    Venous thrombosis of leg    Left   Vitamin D deficiency      Social History   Tobacco Use   Smoking status: Never   Smokeless tobacco: Never  Vaping Use   Vaping Use: Never used  Substance Use Topics   Alcohol use: Yes    Alcohol/week: 0.0 standard drinks    Comment: rare   Drug use:  No    Past Surgical History:  Procedure Laterality Date   BREAST SURGERY     Fibroidadenoma-Left   CHOLECYSTECTOMY     Neck Fusion  06/2016   REFRACTIVE SURGERY     Bilateral   ruptured disc     TONSILLECTOMY     WISDOM TOOTH EXTRACTION      Family History  Problem Relation Age of Onset   Brain cancer Mother 77       meningioma   Obesity Mother    Glaucoma Maternal Grandfather    Glaucoma Maternal Aunt    Cancer Maternal Grandmother        either colon or ovarian   Lung cancer Maternal Aunt    Healthy Sister        x1   Cancer Other        several mat great aunts/uncles; d. 71s, unknown cancers   Colon cancer Neg Hx    Colon polyps Neg Hx    Esophageal cancer Neg Hx    Stomach cancer Neg Hx    Rectal cancer Neg Hx     Allergies  Allergen Reactions   Amoxil [Amoxicillin] Hives   Naproxen Rash    Current Medications:   Current Outpatient Medications:  CAMILA 0.35 MG tablet, Take 1 tablet by mouth daily., Disp: , Rfl: 3   Cholecalciferol (VITAMIN D3) 25 MCG (1000 UT) CAPS, Take 1,000 Int'l Units by mouth., Disp: , Rfl:    clobetasol ointment (TEMOVATE) 0.05 %, , Disp: , Rfl:    hydrocortisone 2.5 % cream, Apply 1 application topically 2 (two) times daily as needed., Disp: 30 g, Rfl: 3   ibuprofen (ADVIL) 800 MG tablet, Take 1 tablet (800 mg total) by mouth every 8 (eight) hours as needed., Disp: 30 tablet, Rfl: 0   ketoconazole (NIZORAL) 2 % cream, Apply 1 application topically daily., Disp: 15 g, Rfl: 0   levocetirizine (XYZAL) 5 MG tablet, Take 5 mg by mouth every evening., Disp: , Rfl:    Multiple Vitamin (MULTIVITAMIN ADULT PO), Take by mouth., Disp: , Rfl:    pantoprazole (PROTONIX) 40 MG tablet, Take 1 tablet (40 mg total) by mouth every other day., Disp: 90 tablet, Rfl: 3   tirzepatide (MOUNJARO) 5 MG/0.5ML Pen, Inject 5 mg into the skin once a week., Disp: 2 mL, Rfl: 2   Turmeric Curcumin 500 MG CAPS, Take 500 mg by mouth., Disp: , Rfl:    Review of  Systems:   ROS Negative unless otherwise specified per HPI. Vitals:   Vitals:   06/29/21 1320  BP: 119/85  Pulse: 85  Temp: 98.4 F (36.9 C)  TempSrc: Temporal  SpO2: 100%  Weight: 186 lb 6 oz (84.5 kg)  Height: 5\' 2"  (1.575 m)     Body mass index is 34.09 kg/m.  Physical Exam:   Physical Exam Vitals and nursing note reviewed.  Constitutional:      General: She is not in acute distress.    Appearance: She is well-developed. She is not ill-appearing or toxic-appearing.  Cardiovascular:     Rate and Rhythm: Normal rate and regular rhythm.     Pulses: Normal pulses.          Popliteal pulses are 2+ on the left side.       Dorsalis pedis pulses are 2+ on the left side.     Heart sounds: Normal heart sounds, S1 normal and S2 normal.     Comments: Normal cap refill in left toes Pulmonary:     Effort: Pulmonary effort is normal.     Breath sounds: Normal breath sounds.  Musculoskeletal:     Right lower leg: Normal.     Left lower leg: Normal.     Right foot: Normal.     Left foot: Normal.     Comments: No obvious calf swelling/tenderness/redness  Skin:    General: Skin is warm and dry.  Neurological:     Mental Status: She is alert.     GCS: GCS eye subscore is 4. GCS verbal subscore is 5. GCS motor subscore is 6.  Psychiatric:        Speech: Speech normal.        Behavior: Behavior normal. Behavior is cooperative.    Assessment and Plan:   Pain of left calf Ongoing We tried to order a STAT u/s however we were unable to schedule stat u/s until Monday at [place] - she is aware of this appointment and is aware that if she has any worsening symptoms in the meantime -- she needs to go to the ER Start Xarelto 15 mg twice daily until u/s -- sample provided (1 bottle with 7 tablets) I advised patient that if new or worsening symptoms occur, to visit the  ED immediately   I,Havlyn C Ratchford,acting as a scribe for [employer], PA.,have documented all relevant  documentation on the behalf of Inda Coke, PA,as directed by  Inda Coke, PA while in the presence of Inda Coke, [place].  I, Inda Coke, [place], have reviewed all documentation for this visit. The documentation on 06/29/21 for the exam, diagnosis, procedures, and orders are all accurate and complete.   Inda Coke, PA-C

## 2021-07-02 ENCOUNTER — Other Ambulatory Visit: Payer: Self-pay

## 2021-07-02 ENCOUNTER — Telehealth: Payer: Self-pay | Admitting: *Deleted

## 2021-07-02 ENCOUNTER — Ambulatory Visit (HOSPITAL_COMMUNITY)
Admission: RE | Admit: 2021-07-02 | Discharge: 2021-07-02 | Disposition: A | Discharge status: Home / Self Care | Payer: BC Managed Care – PPO | Source: Ambulatory Visit | Attending: Physician Assistant | Admitting: Physician Assistant

## 2021-07-02 DIAGNOSIS — M79662 Pain in left lower leg: Secondary | ICD-10-CM | POA: Insufficient documentation

## 2021-07-02 NOTE — Telephone Encounter (Signed)
Received call from [facility] with Vascular pt is negative for DVT or Superficial thrombosis. She said she will let pt go. Told her okay.  Samantha aware of report.

## 2021-07-03 ENCOUNTER — Other Ambulatory Visit (HOSPITAL_BASED_OUTPATIENT_CLINIC_OR_DEPARTMENT_OTHER): Payer: Self-pay

## 2021-07-04 ENCOUNTER — Other Ambulatory Visit (HOSPITAL_BASED_OUTPATIENT_CLINIC_OR_DEPARTMENT_OTHER): Payer: Self-pay

## 2021-07-05 ENCOUNTER — Other Ambulatory Visit (HOSPITAL_BASED_OUTPATIENT_CLINIC_OR_DEPARTMENT_OTHER): Payer: Self-pay

## 2021-07-31 ENCOUNTER — Ambulatory Visit (INDEPENDENT_AMBULATORY_CARE_PROVIDER_SITE_OTHER): Payer: BC Managed Care – PPO | Admitting: Family Medicine

## 2021-07-31 ENCOUNTER — Encounter (INDEPENDENT_AMBULATORY_CARE_PROVIDER_SITE_OTHER): Payer: Self-pay | Admitting: Family Medicine

## 2021-07-31 ENCOUNTER — Other Ambulatory Visit: Payer: Self-pay

## 2021-07-31 VITALS — BP 121/64 | HR 63 | Temp 97.5°F | Ht 62.0 in | Wt 179.0 lb

## 2021-07-31 DIAGNOSIS — E7800 Pure hypercholesterolemia, unspecified: Secondary | ICD-10-CM | POA: Diagnosis not present

## 2021-07-31 DIAGNOSIS — R7303 Prediabetes: Secondary | ICD-10-CM

## 2021-07-31 DIAGNOSIS — R632 Polyphagia: Secondary | ICD-10-CM

## 2021-07-31 DIAGNOSIS — E559 Vitamin D deficiency, unspecified: Secondary | ICD-10-CM

## 2021-07-31 DIAGNOSIS — Z6832 Body mass index (BMI) 32.0-32.9, adult: Secondary | ICD-10-CM

## 2021-07-31 MED ORDER — TIRZEPATIDE 5 MG/0.5ML ~~LOC~~ SOAJ: 5.0000 mg | SUBCUTANEOUS | 0 refills | Status: DC

## 2021-08-01 LAB — CBC WITH DIFFERENTIAL/PLATELET
Basophils Absolute: 0 10*3/uL (ref 0.0–0.2)
Basos: 1 %
EOS (ABSOLUTE): 0.1 10*3/uL (ref 0.0–0.4)
Eos: 2 %
Hematocrit: 43.6 % (ref 34.0–46.6)
Hemoglobin: 13.9 g/dL (ref 11.1–15.9)
Immature Grans (Abs): 0 10*3/uL (ref 0.0–0.1)
Immature Granulocytes: 0 %
Lymphocytes Absolute: 1.3 10*3/uL (ref 0.7–3.1)
Lymphs: 22 %
MCH: 27.6 pg (ref 26.6–33.0)
MCHC: 31.9 g/dL (ref 31.5–35.7)
MCV: 87 fL (ref 79–97)
Monocytes Absolute: 0.5 10*3/uL (ref 0.1–0.9)
Monocytes: 8 %
Neutrophils Absolute: 4 10*3/uL (ref 1.4–7.0)
Neutrophils: 67 %
Platelets: 318 10*3/uL (ref 150–450)
RBC: 5.03 x10E6/uL (ref 3.77–5.28)
RDW: 13.3 % (ref 11.7–15.4)
WBC: 6 10*3/uL (ref 3.4–10.8)

## 2021-08-01 LAB — COMPREHENSIVE METABOLIC PANEL
ALT: 16 IU/L (ref 0–32)
AST: 21 IU/L (ref 0–40)
Albumin/Globulin Ratio: 1.7 (ref 1.2–2.2)
Albumin: 4.1 g/dL (ref 3.8–4.9)
Alkaline Phosphatase: 87 IU/L (ref 44–121)
BUN/Creatinine Ratio: 21 (ref 9–23)
BUN: 18 mg/dL (ref 6–24)
Bilirubin Total: 0.4 mg/dL (ref 0.0–1.2)
CO2: 22 mmol/L (ref 20–29)
Calcium: 9.5 mg/dL (ref 8.7–10.2)
Chloride: 107 mmol/L — ABNORMAL HIGH (ref 96–106)
Creatinine, Ser: 0.84 mg/dL (ref 0.57–1.00)
Globulin, Total: 2.4 g/dL (ref 1.5–4.5)
Glucose: 84 mg/dL (ref 70–99)
Potassium: 4.7 mmol/L (ref 3.5–5.2)
Sodium: 144 mmol/L (ref 134–144)
Total Protein: 6.5 g/dL (ref 6.0–8.5)
eGFR: 84 mL/min/{1.73_m2} (ref >59)

## 2021-08-01 LAB — HEMOGLOBIN A1C
Est. average glucose Bld gHb Est-mCnc: 111 mg/dL
Hgb A1c MFr Bld: 5.5 % (ref 4.8–5.6)

## 2021-08-01 LAB — LIPID PANEL
Chol/HDL Ratio: 3.4 ratio (ref 0.0–4.4)
Cholesterol, Total: 193 mg/dL (ref 100–199)
HDL: 56 mg/dL (ref >39)
LDL Chol Calc (NIH): 126 mg/dL — ABNORMAL HIGH (ref 0–99)
Triglycerides: 60 mg/dL (ref 0–149)
VLDL Cholesterol Cal: 11 mg/dL (ref 5–40)

## 2021-08-01 LAB — VITAMIN D 25 HYDROXY (VIT D DEFICIENCY, FRACTURES): Vit D, 25-Hydroxy: 89.6 ng/mL (ref 30.0–100.0)

## 2021-08-01 NOTE — Progress Notes (Signed)
Chief Complaint:   OBESITY Donna Mendoza is here to discuss her progress with her obesity treatment plan along with follow-up of her obesity related diagnoses. See Medical Weight Management Flowsheet for complete bioelectrical impedance results.  Today's visit was #: 24 Starting weight: 224 lbs Starting date: 08/12/2019 Weight change since last visit: 7 lbs Total lbs lost to date: 45 lbs Total weight loss percentage to date: -20.09%  Nutrition Plan: Category 2 Plan for 80% of the time.  Activity: None. Anti-obesity medications: Mounjaro 5 mg subcutaneously weekly. Reported side effects: None.  Interim History: Donna Mendoza says she enjoyed her trip to [place].  She was intentional about making healthy food choices.  She says that her partner was recently diagnosed with OSA and is getting an oral appliance.  She is hoping for better sleep.  Assessment/Plan:   1. Prediabetes At goal. Goal is HgbA1c < 5.7.  Medication: Mounjaro 5 mg subcutaneously weekly.    Plan:  Continue Mounjaro 5 mg subcutaneously weekly. She will continue to focus on protein-rich, low simple carbohydrate foods. We reviewed the importance of hydration, regular exercise for stress reduction, and restorative sleep.   Lab Results  Component Value Date   HGBA1C 5.5 07/31/2021   - CBC with Differential/Platelet - Comprehensive metabolic panel - Hemoglobin A1c  2. Polyphagia Controlled. Current treatment: Mounjaro 5 mg subcutaneously weekly.    Plan:  Continue Mounjaro 5 mg subcutaneously weekly.  Will refill today, as per below. She will continue to focus on protein-rich, low simple carbohydrate foods. We reviewed the importance of hydration, regular exercise for stress reduction, and restorative sleep.  - Refill tirzepatide (MOUNJARO) 5 MG/0.5ML Pen; Inject 5 mg into the skin once a week.  Dispense: 6 mL; Refill: 0  3. Vitamin D deficiency At goal. She is taking OTC vitamin D 1000 IU daily.  Plan: Continue current  OTC vitamin D supplementation.  Will check vitamin D level today.  Lab Results  Component Value Date   VD25OH 89.6 07/31/2021   VD25OH 73.7 02/28/2021   VD25OH 49.8 02/28/2020   - VITAMIN D 25 Hydroxy (Vit-D Deficiency, Fractures)  4. Pure hypercholesterolemia Course: Controlled. Lipid-lowering medications: None.   Plan: Dietary changes: Increase soluble fiber, decrease simple carbohydrates, decrease saturated fat. Exercise changes: Moderate to vigorous-intensity aerobic activity 150 minutes per week or as tolerated. We will continue to monitor along with PCP/specialists as it pertains to her weight loss journey.  Lab Results  Component Value Date   CHOL 193 07/31/2021   HDL 56 07/31/2021   LDLCALC 126 (H) 07/31/2021   TRIG 60 07/31/2021   CHOLHDL 3.4 07/31/2021   Lab Results  Component Value Date   ALT 16 07/31/2021   AST 21 07/31/2021   ALKPHOS 87 07/31/2021   BILITOT 0.4 07/31/2021   The 10-year ASCVD risk score (Arnett DK, et al., 2019) is: 1.1%   Values used to calculate the score:     Age: 51 years     Sex: Female     Is Non-Hispanic African American: No     Diabetic: No     Tobacco smoker: No     Systolic Blood Pressure: 009 mmHg     Is BP treated: No     HDL Cholesterol: 56 mg/dL     Total Cholesterol: 193 mg/dL  - Lipid panel  5. Obesity, current BMI 32.8  Course: Donna Mendoza is currently in the action stage of change. As such, her goal is to continue with weight loss efforts.  Nutrition goals: She has agreed to the Category 2 Plan.   Exercise goals:  Increase NEAT.  Behavioral modification strategies: increasing lean protein intake, decreasing simple carbohydrates, increasing vegetables, and increasing water intake.  Donna Mendoza has agreed to follow-up with our clinic in 4 weeks. She was informed of the importance of frequent follow-up visits to maximize her success with intensive lifestyle modifications for her multiple health conditions.   Objective:    Blood pressure 121/64, pulse 63, temperature (!) 97.5 F (36.4 C), temperature source Oral, height 5\' 2"  (1.575 m), weight 179 lb (81.2 kg), SpO2 96 %. Body mass index is 32.74 kg/m.  General: Cooperative, alert, well developed, in no acute distress. HEENT: Conjunctivae and lids unremarkable. Cardiovascular: Regular rhythm.  Lungs: Normal work of breathing. Neurologic: No focal deficits.   Lab Results  Component Value Date   CREATININE 0.84 07/31/2021   BUN 18 07/31/2021   NA 144 07/31/2021   K 4.7 07/31/2021   CL 107 (H) 07/31/2021   CO2 22 07/31/2021   Lab Results  Component Value Date   ALT 16 07/31/2021   AST 21 07/31/2021   ALKPHOS 87 07/31/2021   BILITOT 0.4 07/31/2021   Lab Results  Component Value Date   HGBA1C 5.5 07/31/2021   HGBA1C 5.6 02/28/2021   HGBA1C 5.7 06/08/2020   HGBA1C 5.7 (H) 02/28/2020   HGBA1C 5.7 (H) 10/18/2019   Lab Results  Component Value Date   TSH 1.58 06/08/2020   Lab Results  Component Value Date   CHOL 193 07/31/2021   HDL 56 07/31/2021   LDLCALC 126 (H) 07/31/2021   TRIG 60 07/31/2021   CHOLHDL 3.4 07/31/2021   Lab Results  Component Value Date   VD25OH 89.6 07/31/2021   VD25OH 73.7 02/28/2021   VD25OH 49.8 02/28/2020   Lab Results  Component Value Date   WBC 6.0 07/31/2021   HGB 13.9 07/31/2021   HCT 43.6 07/31/2021   MCV 87 07/31/2021   PLT 318 07/31/2021   Attestation Statements:   Reviewed by clinician on day of visit: allergies, medications, problem list, medical history, surgical history, family history, social history, and previous encounter notes.  I, Water quality scientist, CMA, am acting as transcriptionist for Briscoe Deutscher, DO  I have reviewed the above documentation for accuracy and completeness, and I agree with the above. -  Briscoe Deutscher, DO, MS, FAAFP, DABOM - Family and Bariatric Medicine.

## 2021-08-07 ENCOUNTER — Encounter (INDEPENDENT_AMBULATORY_CARE_PROVIDER_SITE_OTHER): Payer: Self-pay | Admitting: Family Medicine

## 2021-08-08 NOTE — Telephone Encounter (Signed)
Pt last seen by Dr. Wallace.  

## 2021-08-09 NOTE — Telephone Encounter (Signed)
Dr.Wallace °

## 2021-08-13 ENCOUNTER — Other Ambulatory Visit (HOSPITAL_BASED_OUTPATIENT_CLINIC_OR_DEPARTMENT_OTHER): Payer: Self-pay

## 2021-08-13 ENCOUNTER — Other Ambulatory Visit (INDEPENDENT_AMBULATORY_CARE_PROVIDER_SITE_OTHER): Payer: Self-pay | Admitting: Family Medicine

## 2021-08-13 ENCOUNTER — Encounter (HOSPITAL_BASED_OUTPATIENT_CLINIC_OR_DEPARTMENT_OTHER): Payer: Self-pay

## 2021-08-13 DIAGNOSIS — R632 Polyphagia: Secondary | ICD-10-CM

## 2021-08-13 MED ORDER — MOUNJARO 5 MG/0.5ML ~~LOC~~ SOAJ
5.0000 mg | SUBCUTANEOUS | 2 refills | Status: DC
  Filled 2021-08-13 – 2021-08-14 (×2): qty 2, 28d supply, fill #0
  Filled 2021-09-11: qty 2, 28d supply, fill #1

## 2021-08-13 NOTE — Telephone Encounter (Signed)
Dr.Wallace °

## 2021-08-14 ENCOUNTER — Other Ambulatory Visit (HOSPITAL_BASED_OUTPATIENT_CLINIC_OR_DEPARTMENT_OTHER): Payer: Self-pay

## 2021-09-11 ENCOUNTER — Other Ambulatory Visit (INDEPENDENT_AMBULATORY_CARE_PROVIDER_SITE_OTHER): Payer: Self-pay | Admitting: Family Medicine

## 2021-09-11 ENCOUNTER — Other Ambulatory Visit: Payer: Self-pay

## 2021-09-11 ENCOUNTER — Other Ambulatory Visit (HOSPITAL_BASED_OUTPATIENT_CLINIC_OR_DEPARTMENT_OTHER): Payer: Self-pay

## 2021-09-11 ENCOUNTER — Encounter (INDEPENDENT_AMBULATORY_CARE_PROVIDER_SITE_OTHER): Payer: Self-pay | Admitting: Family Medicine

## 2021-09-11 ENCOUNTER — Ambulatory Visit (INDEPENDENT_AMBULATORY_CARE_PROVIDER_SITE_OTHER): Payer: BC Managed Care – PPO | Admitting: Family Medicine

## 2021-09-11 VITALS — BP 108/64 | HR 80 | Temp 97.8°F | Ht 62.0 in | Wt 175.0 lb

## 2021-09-11 DIAGNOSIS — R632 Polyphagia: Secondary | ICD-10-CM

## 2021-09-11 DIAGNOSIS — E669 Obesity, unspecified: Secondary | ICD-10-CM

## 2021-09-11 DIAGNOSIS — E7800 Pure hypercholesterolemia, unspecified: Secondary | ICD-10-CM | POA: Diagnosis not present

## 2021-09-11 DIAGNOSIS — R7303 Prediabetes: Secondary | ICD-10-CM | POA: Diagnosis not present

## 2021-09-11 DIAGNOSIS — Z6832 Body mass index (BMI) 32.0-32.9, adult: Secondary | ICD-10-CM

## 2021-09-11 DIAGNOSIS — Z6841 Body Mass Index (BMI) 40.0 and over, adult: Secondary | ICD-10-CM

## 2021-09-11 MED ORDER — TIRZEPATIDE 7.5 MG/0.5ML ~~LOC~~ SOAJ: 7.5000 mg | SUBCUTANEOUS | 3 refills | Status: DC

## 2021-09-11 MED ORDER — MOUNJARO 5 MG/0.5ML ~~LOC~~ SOAJ: 5.0000 mg | SUBCUTANEOUS | 3 refills | Status: DC

## 2021-09-11 NOTE — Telephone Encounter (Signed)
Prior authorization has been started for Mounjaro. Will notify patient and provider once response is received.  

## 2021-09-11 NOTE — Telephone Encounter (Signed)
Dr.Wallace °

## 2021-09-12 NOTE — Telephone Encounter (Signed)
Pt last seen by Dr. Wallace.  

## 2021-09-13 ENCOUNTER — Encounter (INDEPENDENT_AMBULATORY_CARE_PROVIDER_SITE_OTHER): Payer: Self-pay | Admitting: Family Medicine

## 2021-09-13 ENCOUNTER — Other Ambulatory Visit (HOSPITAL_BASED_OUTPATIENT_CLINIC_OR_DEPARTMENT_OTHER): Payer: Self-pay

## 2021-09-13 ENCOUNTER — Encounter (INDEPENDENT_AMBULATORY_CARE_PROVIDER_SITE_OTHER): Payer: Self-pay

## 2021-09-13 DIAGNOSIS — R632 Polyphagia: Secondary | ICD-10-CM

## 2021-09-13 MED ORDER — MOUNJARO 5 MG/0.5ML ~~LOC~~ SOAJ
5.0000 mg | SUBCUTANEOUS | 3 refills | Status: DC
  Filled 2021-09-13: qty 2, 28d supply, fill #0

## 2021-09-13 MED ORDER — TIRZEPATIDE 7.5 MG/0.5ML ~~LOC~~ SOAJ
7.5000 mg | SUBCUTANEOUS | 3 refills | Status: DC
  Filled 2021-09-13 – 2021-10-16 (×2): qty 2, 28d supply, fill #0
  Filled 2021-11-16: qty 2, 28d supply, fill #1
  Filled 2021-12-07 – 2021-12-10 (×2): qty 2, 28d supply, fill #2
  Filled 2022-01-07 – 2022-01-24 (×2): qty 2, 28d supply, fill #3

## 2021-09-13 NOTE — Progress Notes (Signed)
Chief Complaint:   OBESITY Donna Mendoza is here to discuss her progress with her obesity treatment plan along with follow-up of her obesity related diagnoses. See Medical Weight Management Flowsheet for complete bioelectrical impedance results.  Today's visit was #: 25 Starting weight: 224 lbs Starting date: 08/12/2019 Weight change since last visit: 4 lbs Total lbs lost to date: 49 lbs Total weight loss percentage to date: -21.88%  Nutrition Plan: Category 2 Plan for 65-70% of the time.  Activity: None. Anti-obesity medications: Mounjaro 5 mg subcutaneously weekly. Reported side effects: None.  Interim History: [name] is going to [place] in mid June - hoping for 2 months.  Assessment/Plan:   1. Prediabetes At goal. Goal is HgbA1c < 5.7.  Medication: Mounjaro 5 mg subcutaneously weekly.    Plan:  She will continue to focus on protein-rich, low simple carbohydrate foods. We reviewed the importance of hydration, regular exercise for stress reduction, and restorative sleep.   Lab Results  Component Value Date   HGBA1C 5.5 07/31/2021   2. Polyphagia Not at goal. Current treatment: Mounjaro 5 mg subcutaneously weekly.    Plan: Continue Mounjaro 5 mg subcutaneously weekly.  Will also send in Mounjaro 7.5 mg weekly dose.  She will continue to focus on protein-rich, low simple carbohydrate foods. We reviewed the importance of hydration, regular exercise for stress reduction, and restorative sleep.  - Refill tirzepatide (MOUNJARO) 5 MG/0.5ML Pen; Inject 5 mg into the skin once a week.  Dispense: 2 mL; Refill: 3 - tirzepatide (MOUNJARO) 7.5 MG/0.5ML Pen; Inject 7.5 mg into the skin once a week.  Dispense: 2 mL; Refill: 3  3. Pure hypercholesterolemia Course: Controlled. Lipid-lowering medications: None.   Plan: Dietary changes: Increase soluble fiber, decrease simple carbohydrates, decrease saturated fat. Exercise changes: Moderate to vigorous-intensity aerobic activity 150 minutes per  week or as tolerated. We will continue to monitor along with PCP/specialists as it pertains to her weight loss journey.  Lab Results  Component Value Date   CHOL 193 07/31/2021   HDL 56 07/31/2021   LDLCALC 126 (H) 07/31/2021   TRIG 60 07/31/2021   CHOLHDL 3.4 07/31/2021   Lab Results  Component Value Date   ALT 16 07/31/2021   AST 21 07/31/2021   ALKPHOS 87 07/31/2021   BILITOT 0.4 07/31/2021   The 10-year ASCVD risk score (Arnett DK, et al., 2019) is: 0.9%   Values used to calculate the score:     Age: 51 years     Sex: Female     Is Non-Hispanic African American: No     Diabetic: No     Tobacco smoker: No     Systolic Blood Pressure: 081 mmHg     Is BP treated: No     HDL Cholesterol: 56 mg/dL     Total Cholesterol: 193 mg/dL  4. Obesity, current BMI 32  Course: [name] is currently in the action stage of change. As such, her goal is to continue with weight loss efforts.   Nutrition goals: She has agreed to the Category 2 Plan.   Exercise goals:  Increase NEAT activities.  Behavioral modification strategies: increasing lean protein intake, decreasing simple carbohydrates, increasing vegetables, and increasing water intake.  Roby has agreed to follow-up with our clinic in 4 weeks. She was informed of the importance of frequent follow-up visits to maximize her success with intensive lifestyle modifications for her multiple health conditions.   Objective:   Blood pressure 108/64, pulse 80, temperature 97.8 F (36.6 C),  temperature source Oral, height 5\' 2"  (1.575 m), weight 175 lb (79.4 kg), SpO2 98 %. Body mass index is 32.01 kg/m.  General: Cooperative, alert, well developed, in no acute distress. HEENT: Conjunctivae and lids unremarkable. Cardiovascular: Regular rhythm.  Lungs: Normal work of breathing. Neurologic: No focal deficits.   Lab Results  Component Value Date   CREATININE 0.84 07/31/2021   BUN 18 07/31/2021   NA 144 07/31/2021   K 4.7  07/31/2021   CL 107 (H) 07/31/2021   CO2 22 07/31/2021   Lab Results  Component Value Date   ALT 16 07/31/2021   AST 21 07/31/2021   ALKPHOS 87 07/31/2021   BILITOT 0.4 07/31/2021   Lab Results  Component Value Date   HGBA1C 5.5 07/31/2021   HGBA1C 5.6 02/28/2021   HGBA1C 5.7 06/08/2020   HGBA1C 5.7 (H) 02/28/2020   HGBA1C 5.7 (H) 10/18/2019   Lab Results  Component Value Date   TSH 1.58 06/08/2020   Lab Results  Component Value Date   CHOL 193 07/31/2021   HDL 56 07/31/2021   LDLCALC 126 (H) 07/31/2021   TRIG 60 07/31/2021   CHOLHDL 3.4 07/31/2021   Lab Results  Component Value Date   VD25OH 89.6 07/31/2021   VD25OH 73.7 02/28/2021   VD25OH 49.8 02/28/2020   Lab Results  Component Value Date   WBC 6.0 07/31/2021   HGB 13.9 07/31/2021   HCT 43.6 07/31/2021   MCV 87 07/31/2021   PLT 318 07/31/2021   Attestation Statements:   Reviewed by clinician on day of visit: allergies, medications, problem list, medical history, surgical history, family history, social history, and previous encounter notes.  I, Water quality scientist, CMA, am acting as transcriptionist for Briscoe Deutscher, DO  I have reviewed the above documentation for accuracy and completeness, and I agree with the above. -  Briscoe Deutscher, DO, MS, FAAFP, DABOM - Family and Bariatric Medicine.

## 2021-10-08 ENCOUNTER — Encounter (INDEPENDENT_AMBULATORY_CARE_PROVIDER_SITE_OTHER): Payer: Self-pay | Admitting: Family Medicine

## 2021-10-16 ENCOUNTER — Other Ambulatory Visit (HOSPITAL_BASED_OUTPATIENT_CLINIC_OR_DEPARTMENT_OTHER): Payer: Self-pay

## 2021-10-16 ENCOUNTER — Ambulatory Visit (INDEPENDENT_AMBULATORY_CARE_PROVIDER_SITE_OTHER): Payer: BC Managed Care – PPO | Admitting: Family Medicine

## 2021-10-16 ENCOUNTER — Other Ambulatory Visit: Payer: Self-pay

## 2021-10-16 ENCOUNTER — Encounter (HOSPITAL_BASED_OUTPATIENT_CLINIC_OR_DEPARTMENT_OTHER): Payer: Self-pay

## 2021-10-16 ENCOUNTER — Encounter (INDEPENDENT_AMBULATORY_CARE_PROVIDER_SITE_OTHER): Payer: Self-pay | Admitting: Family Medicine

## 2021-10-16 VITALS — BP 100/59 | HR 60 | Temp 98.1°F | Ht 62.0 in | Wt 177.0 lb

## 2021-10-16 DIAGNOSIS — E669 Obesity, unspecified: Secondary | ICD-10-CM | POA: Diagnosis not present

## 2021-10-16 DIAGNOSIS — E66813 Obesity, class 3: Secondary | ICD-10-CM

## 2021-10-16 DIAGNOSIS — K219 Gastro-esophageal reflux disease without esophagitis: Secondary | ICD-10-CM | POA: Diagnosis not present

## 2021-10-16 DIAGNOSIS — F418 Other specified anxiety disorders: Secondary | ICD-10-CM

## 2021-10-16 DIAGNOSIS — R632 Polyphagia: Secondary | ICD-10-CM

## 2021-10-16 DIAGNOSIS — Z6832 Body mass index (BMI) 32.0-32.9, adult: Secondary | ICD-10-CM

## 2021-10-16 MED ORDER — PROPRANOLOL HCL 20 MG PO TABS
20.0000 mg | ORAL_TABLET | Freq: Three times a day (TID) | ORAL | 0 refills | Status: DC | PRN
  Filled 2021-10-16: qty 24, 8d supply, fill #0

## 2021-10-16 MED ORDER — PANTOPRAZOLE SODIUM 40 MG PO TBEC
40.0000 mg | DELAYED_RELEASE_TABLET | ORAL | 1 refills | Status: AC
  Filled 2021-10-16: qty 45, 90d supply, fill #0

## 2021-10-17 ENCOUNTER — Other Ambulatory Visit (HOSPITAL_BASED_OUTPATIENT_CLINIC_OR_DEPARTMENT_OTHER): Payer: Self-pay

## 2021-10-18 NOTE — Progress Notes (Signed)
Chief Complaint:   OBESITY Donna Mendoza is here to discuss her progress with her obesity treatment plan along with follow-up of her obesity related diagnoses.   Today's visit was #: 47 Starting weight: 224 lbs Starting date: 08/12/2019 Today's weight: 177 lbs Today's date: 10/18/2021 Weight change since last visit: +2 lbs Total lbs lost to date: 47 lbs Body mass index is 32.37 kg/m.  Total weight loss percentage to date: -20.98%  Current Meal Plan: the Category 2 Plan for 50% of the time.  Current Exercise Plan: Walking for 20 minutes 1-3 times per week. Current Anti-Obesity Medications: Mounjaro. Side effects: None.  Interim History: Donna Mendoza reports that an old friend is in hospice with cancer. Her partner will have a tongue lesion removed.  Assessment/Plan:   1. Gastroesophageal reflux disease without esophagitis The current medical regimen is effective;  continue present plan and medications.  - Refill pantoprazole (PROTONIX) 40 MG tablet; Take 1 tablet (40 mg total) by mouth every other day.  Dispense: 90 tablet; Refill: 1  2. Polyphagia Not at goal. Current treatment: Mounjaro.   Plan: She will continue to focus on protein-rich, low simple carbohydrate foods. We reviewed the importance of hydration, regular exercise for stress reduction, and restorative sleep.  3. Situational anxiety Behavior modification techniques were discussed today to help Donna Mendoza deal with her anxiety.    Plan: Start Propranolol, as per below.  - Start propranolol (INDERAL) 20 MG tablet; Take 1 tablet (20 mg total) by mouth 3 (three) times daily as needed.  Dispense: 24 tablet; Refill: 0  4. Obesity, current BMI 32.5 Course: Donna Mendoza is currently in the action stage of change. As such, her goal is to continue with weight loss efforts.   Nutrition goals: She has agreed to the Category 2 Plan.   Exercise goals: As is.  Behavioral modification strategies: increasing lean protein intake,  decreasing simple carbohydrates, and increasing vegetables.  Donna Mendoza has agreed to follow-up with our clinic in 6 weeks. She was informed of the importance of frequent follow-up visits to maximize her success with intensive lifestyle modifications for her multiple health conditions.   Objective:   Blood pressure (!) 100/59, pulse 60, temperature 98.1 F (36.7 C), temperature source Oral, height '5\' 2"'$  (1.575 m), weight 177 lb (80.3 kg), SpO2 97 %. Body mass index is 32.37 kg/m.  General: Cooperative, alert, well developed, in no acute distress. HEENT: Conjunctivae and lids unremarkable. Cardiovascular: Regular rhythm.  Lungs: Normal work of breathing. Neurologic: No focal deficits.   Lab Results  Component Value Date   CREATININE 0.84 07/31/2021   BUN 18 07/31/2021   NA 144 07/31/2021   K 4.7 07/31/2021   CL 107 (H) 07/31/2021   CO2 22 07/31/2021   Lab Results  Component Value Date   ALT 16 07/31/2021   AST 21 07/31/2021   ALKPHOS 87 07/31/2021   BILITOT 0.4 07/31/2021   Lab Results  Component Value Date   HGBA1C 5.5 07/31/2021   HGBA1C 5.6 02/28/2021   HGBA1C 5.7 06/08/2020   HGBA1C 5.7 (H) 02/28/2020   HGBA1C 5.7 (H) 10/18/2019   Lab Results  Component Value Date   TSH 1.58 06/08/2020   Lab Results  Component Value Date   CHOL 193 07/31/2021   HDL 56 07/31/2021   LDLCALC 126 (H) 07/31/2021   TRIG 60 07/31/2021   CHOLHDL 3.4 07/31/2021   Lab Results  Component Value Date   VD25OH 89.6 07/31/2021   VD25OH 73.7 02/28/2021   VD25OH  49.8 02/28/2020   Lab Results  Component Value Date   WBC 6.0 07/31/2021   HGB 13.9 07/31/2021   HCT 43.6 07/31/2021   MCV 87 07/31/2021   PLT 318 07/31/2021   Attestation Statements:   Reviewed by clinician on day of visit: allergies, medications, problem list, medical history, surgical history, family history, social history, and previous encounter notes.  Leodis Binet Friedenbach, CMA, am acting as Location manager for  [employer], DO.  I have reviewed the above documentation for accuracy and completeness, and I agree with the above. -  Briscoe Deutscher, DO, MS, FAAFP, DABOM - Family and Bariatric Medicine.

## 2021-10-30 ENCOUNTER — Ambulatory Visit (INDEPENDENT_AMBULATORY_CARE_PROVIDER_SITE_OTHER): Payer: BC Managed Care – PPO | Admitting: Family Medicine

## 2021-11-16 ENCOUNTER — Other Ambulatory Visit (HOSPITAL_BASED_OUTPATIENT_CLINIC_OR_DEPARTMENT_OTHER): Payer: Self-pay

## 2021-12-06 DIAGNOSIS — F418 Other specified anxiety disorders: Secondary | ICD-10-CM | POA: Diagnosis not present

## 2021-12-06 DIAGNOSIS — R7303 Prediabetes: Secondary | ICD-10-CM | POA: Diagnosis not present

## 2021-12-06 DIAGNOSIS — K219 Gastro-esophageal reflux disease without esophagitis: Secondary | ICD-10-CM | POA: Diagnosis not present

## 2021-12-06 DIAGNOSIS — R632 Polyphagia: Secondary | ICD-10-CM | POA: Diagnosis not present

## 2021-12-06 DIAGNOSIS — Z9189 Other specified personal risk factors, not elsewhere classified: Secondary | ICD-10-CM | POA: Diagnosis not present

## 2021-12-07 ENCOUNTER — Encounter (HOSPITAL_BASED_OUTPATIENT_CLINIC_OR_DEPARTMENT_OTHER): Payer: Self-pay | Admitting: Pharmacist

## 2021-12-07 ENCOUNTER — Other Ambulatory Visit (HOSPITAL_BASED_OUTPATIENT_CLINIC_OR_DEPARTMENT_OTHER): Payer: Self-pay

## 2021-12-10 ENCOUNTER — Other Ambulatory Visit (HOSPITAL_BASED_OUTPATIENT_CLINIC_OR_DEPARTMENT_OTHER): Payer: Self-pay

## 2021-12-13 ENCOUNTER — Other Ambulatory Visit (HOSPITAL_BASED_OUTPATIENT_CLINIC_OR_DEPARTMENT_OTHER): Payer: Self-pay

## 2021-12-13 MED ORDER — MOUNJARO 10 MG/0.5ML ~~LOC~~ SOAJ
SUBCUTANEOUS | 0 refills | Status: DC
  Filled 2022-02-11: qty 2, 28d supply, fill #0

## 2021-12-13 MED ORDER — MOUNJARO 7.5 MG/0.5ML ~~LOC~~ SOAJ
SUBCUTANEOUS | 0 refills | Status: DC
  Filled 2022-01-02: qty 2, 28d supply, fill #0

## 2022-01-02 ENCOUNTER — Other Ambulatory Visit (HOSPITAL_BASED_OUTPATIENT_CLINIC_OR_DEPARTMENT_OTHER): Payer: Self-pay

## 2022-01-07 ENCOUNTER — Other Ambulatory Visit (HOSPITAL_BASED_OUTPATIENT_CLINIC_OR_DEPARTMENT_OTHER): Payer: Self-pay

## 2022-01-11 ENCOUNTER — Other Ambulatory Visit (HOSPITAL_BASED_OUTPATIENT_CLINIC_OR_DEPARTMENT_OTHER): Payer: Self-pay

## 2022-01-28 ENCOUNTER — Other Ambulatory Visit (HOSPITAL_BASED_OUTPATIENT_CLINIC_OR_DEPARTMENT_OTHER): Payer: Self-pay

## 2022-01-28 ENCOUNTER — Encounter (HOSPITAL_BASED_OUTPATIENT_CLINIC_OR_DEPARTMENT_OTHER): Payer: Self-pay

## 2022-02-11 ENCOUNTER — Other Ambulatory Visit (HOSPITAL_BASED_OUTPATIENT_CLINIC_OR_DEPARTMENT_OTHER): Payer: Self-pay

## 2022-02-12 ENCOUNTER — Other Ambulatory Visit (HOSPITAL_BASED_OUTPATIENT_CLINIC_OR_DEPARTMENT_OTHER): Payer: Self-pay

## 2022-02-27 ENCOUNTER — Encounter (INDEPENDENT_AMBULATORY_CARE_PROVIDER_SITE_OTHER): Payer: Self-pay

## 2022-03-11 ENCOUNTER — Encounter (HOSPITAL_BASED_OUTPATIENT_CLINIC_OR_DEPARTMENT_OTHER): Payer: Self-pay | Admitting: Pharmacist

## 2022-03-11 ENCOUNTER — Other Ambulatory Visit (HOSPITAL_BASED_OUTPATIENT_CLINIC_OR_DEPARTMENT_OTHER): Payer: Self-pay

## 2022-03-11 MED ORDER — WEGOVY 1.7 MG/0.75ML ~~LOC~~ SOAJ
SUBCUTANEOUS | 0 refills | Status: DC
  Filled 2022-03-11: qty 3, 28d supply, fill #0

## 2022-03-12 ENCOUNTER — Other Ambulatory Visit (HOSPITAL_BASED_OUTPATIENT_CLINIC_OR_DEPARTMENT_OTHER): Payer: Self-pay

## 2022-03-13 DIAGNOSIS — R7303 Prediabetes: Secondary | ICD-10-CM | POA: Diagnosis not present

## 2022-03-13 DIAGNOSIS — R632 Polyphagia: Secondary | ICD-10-CM | POA: Diagnosis not present

## 2022-03-13 DIAGNOSIS — F418 Other specified anxiety disorders: Secondary | ICD-10-CM | POA: Diagnosis not present

## 2022-03-19 ENCOUNTER — Other Ambulatory Visit: Payer: Self-pay

## 2022-03-19 MED ORDER — WEGOVY 1.7 MG/0.75ML ~~LOC~~ SOAJ
SUBCUTANEOUS | 0 refills | Status: DC
  Filled 2022-04-19: qty 3, 28d supply, fill #0

## 2022-04-15 ENCOUNTER — Encounter: Payer: Self-pay | Admitting: *Deleted

## 2022-04-19 ENCOUNTER — Other Ambulatory Visit (HOSPITAL_BASED_OUTPATIENT_CLINIC_OR_DEPARTMENT_OTHER): Payer: Self-pay

## 2022-04-24 ENCOUNTER — Ambulatory Visit (INDEPENDENT_AMBULATORY_CARE_PROVIDER_SITE_OTHER): Payer: BC Managed Care – PPO | Admitting: Physician Assistant

## 2022-04-24 ENCOUNTER — Encounter: Payer: Self-pay | Admitting: Physician Assistant

## 2022-04-24 VITALS — BP 110/76 | HR 71 | Temp 97.8°F | Ht 61.25 in | Wt 174.0 lb

## 2022-04-24 DIAGNOSIS — E669 Obesity, unspecified: Secondary | ICD-10-CM | POA: Diagnosis not present

## 2022-04-24 DIAGNOSIS — Z23 Encounter for immunization: Secondary | ICD-10-CM | POA: Diagnosis not present

## 2022-04-24 DIAGNOSIS — Z0000 Encounter for general adult medical examination without abnormal findings: Secondary | ICD-10-CM | POA: Diagnosis not present

## 2022-04-24 DIAGNOSIS — E7800 Pure hypercholesterolemia, unspecified: Secondary | ICD-10-CM

## 2022-04-24 NOTE — Progress Notes (Signed)
Subjective:    Donna Mendoza is a 51 y.o. female and is here for a comprehensive physical exam.  HPI  Health Maintenance Due  Topic Date Due   INFLUENZA VACCINE  02/19/2022    Acute Concerns: None  Chronic Issues: Obesity -- seeing Dr Briscoe Deutscher at [facility]. Doing well with Wegovy 1.7 mg weekly at this time. Denies any major concerns. Eating well and exercising.  HLD -- blood work checked 8 months ago showed LDL of 126.  Health Maintenance: Immunizations -- UTD Colonoscopy -- UTD Mammogram -- UTD PAP -- UTD Bone Density -- n/a Diet -- protein cereal, fair life milk, protein rich snacks, meat and veggies Exercise -- walking at lunch  Sleep habits -- having some possible hot flashes Mood -- no major concerns  UTD with dentist? - yes UTD with eye doctor? - yes  Weight history: Wt Readings from Last 10 Encounters:  04/24/22 174 lb (78.9 kg)  10/16/21 177 lb (80.3 kg)  09/11/21 175 lb (79.4 kg)  07/31/21 179 lb (81.2 kg)  06/29/21 186 lb 6 oz (84.5 kg)  06/18/21 184 lb (83.5 kg)  06/05/21 186 lb (84.4 kg)  05/08/21 190 lb (86.2 kg)  03/28/21 195 lb (88.5 kg)  02/28/21 201 lb (91.2 kg)   Body mass index is 32.61 kg/m. No LMP recorded. (Menstrual status: Oral contraceptives).  Alcohol use:  reports current alcohol use.  Tobacco use:  Tobacco Use: Low Risk  (04/24/2022)   Patient History    Smoking Tobacco Use: Never    Smokeless Tobacco Use: Never    Passive Exposure: Not on file   Eligible for lung cancer screening? no     04/24/2022    8:32 AM  Depression screen PHQ 2/9  Decreased Interest 0  Down, Depressed, Hopeless 0  PHQ - 2 Score 0  Altered sleeping 1  Tired, decreased energy 1  Change in appetite 0  Feeling bad or failure about yourself  0  Trouble concentrating 0  Moving slowly or fidgety/restless 0  Suicidal thoughts 0  PHQ-9 Score 2  Difficult doing work/chores Not difficult at all     Other  providers/specialists: Patient Care Team: Inda Coke, [place] as PCP - General (Physician Assistant) Briscoe Deutscher, DO as Consulting Physician (Family Medicine) Lavonna Monarch, MD (Inactive) as Consulting Physician (Dermatology)    PMHx, SurgHx, SocialHx, Medications, and Allergies were reviewed in the Visit Navigator and updated as appropriate.   Past Medical History:  Diagnosis Date   Allergy    seasonal   Family history of cancer    Family history of lung cancer    GERD (gastroesophageal reflux disease)    History of chicken pox    Venous thrombosis of leg    Left   Vitamin D deficiency      Past Surgical History:  Procedure Laterality Date   BREAST SURGERY     Fibroidadenoma-Left   CHOLECYSTECTOMY     Neck Fusion  06/2016   REFRACTIVE SURGERY     Bilateral   ruptured disc     TONSILLECTOMY     WISDOM TOOTH EXTRACTION       Family History  Problem Relation Age of Onset   Brain cancer Mother 27       meningioma   Obesity Mother    Glaucoma Maternal Grandfather    Glaucoma Maternal Aunt    Cancer Maternal Grandmother        either colon or ovarian   Lung cancer Maternal  Aunt    Healthy Sister        x1   Cancer Other        several mat great aunts/uncles; d. 64s, unknown cancers   Colon cancer Neg Hx    Colon polyps Neg Hx    Esophageal cancer Neg Hx    Stomach cancer Neg Hx    Rectal cancer Neg Hx     Social History   Tobacco Use   Smoking status: Never   Smokeless tobacco: Never  Vaping Use   Vaping Use: Never used  Substance Use Topics   Alcohol use: Yes    Alcohol/week: 0.0 standard drinks of alcohol    Comment: rare   Drug use: No    Review of Systems:   Review of Systems  Constitutional:  Negative for chills, fever, malaise/fatigue and weight loss.  HENT:  Negative for hearing loss, sinus pain and sore throat.   Respiratory:  Negative for cough and hemoptysis.   Cardiovascular:  Negative for chest pain, palpitations, leg swelling  and PND.  Gastrointestinal:  Negative for abdominal pain, constipation, diarrhea, heartburn, nausea and vomiting.  Genitourinary:  Negative for dysuria, frequency and urgency.  Musculoskeletal:  Negative for back pain, myalgias and neck pain.  Skin:  Negative for itching and rash.  Neurological:  Negative for dizziness, tingling, seizures and headaches.  Endo/Heme/Allergies:  Negative for polydipsia.  Psychiatric/Behavioral:  Negative for depression. The patient is not nervous/anxious.     Objective:   BP 110/76 (BP Location: Left Arm, Patient Position: Sitting, Cuff Size: Large)   Pulse 71   Temp 97.8 F (36.6 C) (Temporal)   Ht 5' 1.25" (1.556 m)   Wt 174 lb (78.9 kg)   SpO2 96%   BMI 32.61 kg/m  Body mass index is 32.61 kg/m.   General Appearance:    Alert, cooperative, no distress, appears stated age  Head:    Normocephalic, without obvious abnormality, atraumatic  Eyes:    PERRL, conjunctiva/corneas clear, EOM's intact, fundi    benign, both eyes  Ears:    Normal TM's and external ear canals, both ears  Nose:   Nares normal, septum midline, mucosa normal, no drainage    or sinus tenderness  Throat:   Lips, mucosa, and tongue normal; teeth and gums normal  Neck:   Supple, symmetrical, trachea midline, no adenopathy;    thyroid:  no enlargement/tenderness/nodules; no carotid   bruit or JVD  Back:     Symmetric, no curvature, ROM normal, no CVA tenderness  Lungs:     Clear to auscultation bilaterally, respirations unlabored  Chest Wall:    No tenderness or deformity   Heart:    Regular rate and rhythm, S1 and S2 normal, no murmur, rub or gallop  Breast Exam:    Deferred  Abdomen:     Soft, non-tender, bowel sounds active all four quadrants,    no masses, no organomegaly  Genitalia:    Deferred  Extremities:   Extremities normal, atraumatic, no cyanosis or edema  Pulses:   2+ and symmetric all extremities  Skin:   Skin color, texture, turgor normal, no rashes or lesions   Lymph nodes:   Cervical, supraclavicular, and axillary nodes normal  Neurologic:   CNII-XII intact, normal strength, sensation and reflexes    throughout    Assessment/Plan:   Routine physical examination Today patient counseled on age appropriate routine health concerns for screening and prevention, each reviewed and up to date or declined. Immunizations reviewed  and up to date or declined. Labs reviewed from Jan 2023. Risk factors for depression reviewed and negative. Hearing function and visual acuity are intact. ADLs screened and addressed as needed. Functional ability and level of safety reviewed and appropriate. Education, counseling and referrals performed based on assessed risks today. Patient provided with a copy of personalized plan for preventive services.   Obesity, unspecified classification, unspecified obesity type, unspecified whether serious comorbidity present Doing well Continue management with [facility]   Pure hypercholesterolemia ASCVD is 1% Continue to check lipid panel yearly   Inda Coke, PA-C [place]

## 2022-04-25 DIAGNOSIS — R632 Polyphagia: Secondary | ICD-10-CM | POA: Diagnosis not present

## 2022-04-25 DIAGNOSIS — R61 Generalized hyperhidrosis: Secondary | ICD-10-CM | POA: Diagnosis not present

## 2022-04-25 DIAGNOSIS — K219 Gastro-esophageal reflux disease without esophagitis: Secondary | ICD-10-CM | POA: Diagnosis not present

## 2022-04-26 ENCOUNTER — Encounter: Payer: Self-pay | Admitting: Physician Assistant

## 2022-04-29 ENCOUNTER — Encounter: Payer: Self-pay | Admitting: Physician Assistant

## 2022-05-08 ENCOUNTER — Encounter: Payer: Self-pay | Admitting: Physician Assistant

## 2022-05-13 DIAGNOSIS — Z6831 Body mass index (BMI) 31.0-31.9, adult: Secondary | ICD-10-CM | POA: Diagnosis not present

## 2022-05-13 DIAGNOSIS — Z01419 Encounter for gynecological examination (general) (routine) without abnormal findings: Secondary | ICD-10-CM | POA: Diagnosis not present

## 2022-05-13 DIAGNOSIS — Z1231 Encounter for screening mammogram for malignant neoplasm of breast: Secondary | ICD-10-CM | POA: Diagnosis not present

## 2022-05-13 DIAGNOSIS — Z1151 Encounter for screening for human papillomavirus (HPV): Secondary | ICD-10-CM | POA: Diagnosis not present

## 2022-05-13 DIAGNOSIS — Z124 Encounter for screening for malignant neoplasm of cervix: Secondary | ICD-10-CM | POA: Diagnosis not present

## 2022-05-13 LAB — HM MAMMOGRAPHY: HM Mammogram: NORMAL (ref 0–4)

## 2022-05-21 LAB — HM PAP SMEAR: HPV, high-risk: NEGATIVE

## 2022-06-02 ENCOUNTER — Telehealth: Payer: BC Managed Care – PPO | Admitting: Emergency Medicine

## 2022-06-02 DIAGNOSIS — J019 Acute sinusitis, unspecified: Secondary | ICD-10-CM

## 2022-06-02 DIAGNOSIS — B9689 Other specified bacterial agents as the cause of diseases classified elsewhere: Secondary | ICD-10-CM

## 2022-06-02 MED ORDER — FLUCONAZOLE 150 MG PO TABS: 150.0000 mg | ORAL_TABLET | Freq: Every day | ORAL | 0 refills | Status: DC

## 2022-06-02 MED ORDER — DOXYCYCLINE HYCLATE 100 MG PO TABS: 100.0000 mg | ORAL_TABLET | Freq: Two times a day (BID) | ORAL | 0 refills | Status: DC

## 2022-06-02 NOTE — Progress Notes (Signed)
Virtual Visit Consent   Donna Mendoza, you are scheduled for a virtual visit with a [facility] provider today. Just as with appointments in the office, your consent must be obtained to participate. Your consent will be active for this visit and any virtual visit you may have with one of our providers in the next 365 days. If you have a MyChart account, a copy of this consent can be sent to you electronically.  As this is a virtual visit, video technology does not allow for your provider to perform a traditional examination. This may limit your provider's ability to fully assess your condition. If your provider identifies any concerns that need to be evaluated in person or the need to arrange testing (such as labs, EKG, etc.), we will make arrangements to do so. Although advances in technology are sophisticated, we cannot ensure that it will always work on either your end or our end. If the connection with a video visit is poor, the visit may have to be switched to a telephone visit. With either a video or telephone visit, we are not always able to ensure that we have a secure connection.  By engaging in this virtual visit, you consent to the provision of healthcare and authorize for your insurance to be billed (if applicable) for the services provided during this visit. Depending on your insurance coverage, you may receive a charge related to this service.  I need to obtain your verbal consent now. Are you willing to proceed with your visit today? Donna Mendoza has provided verbal consent on 06/02/2022 for a virtual visit (video or telephone). Carvel Getting, NP  Date: 06/02/2022 1:23 PM  Virtual Visit via Video Note   I, Carvel Getting, connected with  Donna Mendoza  ([id number], 24-Nov-1970) on 06/02/22 at  1:30 PM EST by a video-enabled telemedicine application and verified that I am speaking with the correct person using two identifiers.  Location: Patient: Virtual Visit Location  Patient: Home Provider: Virtual Visit Location Provider: Home Office   I discussed the limitations of evaluation and management by telemedicine and the availability of in person appointments. The patient expressed understanding and agreed to proceed.    History of Present Illness: Donna Mendoza is a 51 y.o. who identifies as a female who was assigned female at birth, and is being seen today for possible sinus infection. Had Covid mid-Oct, was completely better/no more symptoms around 05/14/22. On 05/28/22, developed new sinus congestion. Negative for COvid. Using neti pot 2-3x/day and mucinex. Does not like to use nasal sprays. Has thick yellow nasal discharge. No fever.   HPI: HPI  Problems:  Patient Active Problem List   Diagnosis Date Noted   Genetic testing 10/31/2020   Family history of lung cancer    Family history of cancer    Primary insomnia 05/27/2018   Plantar fasciitis of left foot 06/24/2017   Neck pain 04/19/2016   Left shoulder pain 03/27/2016   History of DVT (deep vein thrombosis) 06/20/2014    Allergies:  Allergies  Allergen Reactions   Amoxil [Amoxicillin] Hives   Naproxen Rash   Medications:  Current Outpatient Medications:    doxycycline (VIBRA-TABS) 100 MG tablet, Take 1 tablet (100 mg total) by mouth 2 (two) times daily., Disp: 20 tablet, Rfl: 0   fluconazole (DIFLUCAN) 150 MG tablet, Take 1 tablet (150 mg total) by mouth daily., Disp: 1 tablet, Rfl: 0   CAMILA 0.35 MG tablet, Take 1 tablet by mouth daily., Disp: ,  Rfl: 3   Cholecalciferol (VITAMIN D3) 25 MCG (1000 UT) CAPS, Take 1,000 Int'l Units by mouth., Disp: , Rfl:    clobetasol ointment (TEMOVATE) 0.05 %, , Disp: , Rfl:    hydrocortisone 2.5 % cream, Apply 1 application topically 2 (two) times daily as needed., Disp: 30 g, Rfl: 3   ibuprofen (ADVIL) 800 MG tablet, Take 1 tablet (800 mg total) by mouth every 8 (eight) hours as needed., Disp: 30 tablet, Rfl: 0   ketoconazole (NIZORAL) 2 % cream,  Apply 1 application topically daily., Disp: 15 g, Rfl: 0   levocetirizine (XYZAL) 5 MG tablet, Take 5 mg by mouth every evening., Disp: , Rfl:    Multiple Vitamin (MULTIVITAMIN ADULT PO), Take by mouth., Disp: , Rfl:    pantoprazole (PROTONIX) 40 MG tablet, Take 1 tablet (40 mg total) by mouth every other day., Disp: 90 tablet, Rfl: 1   propranolol (INDERAL) 20 MG tablet, Take 1 tablet (20 mg total) by mouth 3 (three) times daily as needed., Disp: 24 tablet, Rfl: 0   Semaglutide-Weight Management (WEGOVY) 1.7 MG/0.75ML SOAJ, 0.75 mL Subcutaneous once a week 30 day(s), Disp: 3 mL, Rfl: 0   Turmeric Curcumin 500 MG CAPS, Take 500 mg by mouth., Disp: , Rfl:   Observations/Objective: Patient is well-developed, well-nourished in no acute distress.  Resting comfortably  at home.  Head is normocephalic, atraumatic.  No labored breathing.  Speech is clear and coherent with logical content.  Patient is alert and oriented at baseline.    Assessment and Plan: 1. Acute bacterial sinusitis  Rx doxycycline. Continue supportive care measures.   Follow Up Instructions: I discussed the assessment and treatment plan with the patient. The patient was provided an opportunity to ask questions and all were answered. The patient agreed with the plan and demonstrated an understanding of the instructions.  A copy of instructions were sent to the patient via MyChart unless otherwise noted below.   The patient was advised to call back or seek an in-person evaluation if the symptoms worsen or if the condition fails to improve as anticipated.  Time:  I spent 10 minutes with the patient via telehealth technology discussing the above problems/concerns.    Carvel Getting, NP

## 2022-06-02 NOTE — Patient Instructions (Signed)
  [facility], thank you for joining Carvel Getting, NP for today's virtual visit.  While this provider is not your primary care provider (PCP), if your PCP is located in our provider database this encounter information will be shared with them immediately following your visit.   [facility] account gives you access to today's visit and all your visits, tests, and labs performed at [facility] " click here if you don't have a [facility] account or go to [email or web address]  Consent: (Patient) Donna Mendoza provided verbal consent for this virtual visit at the beginning of the encounter.  Current Medications:  Current Outpatient Medications:    CAMILA 0.35 MG tablet, Take 1 tablet by mouth daily., Disp: , Rfl: 3   Cholecalciferol (VITAMIN D3) 25 MCG (1000 UT) CAPS, Take 1,000 Int'l Units by mouth., Disp: , Rfl:    clobetasol ointment (TEMOVATE) 0.05 %, , Disp: , Rfl:    hydrocortisone 2.5 % cream, Apply 1 application topically 2 (two) times daily as needed., Disp: 30 g, Rfl: 3   ibuprofen (ADVIL) 800 MG tablet, Take 1 tablet (800 mg total) by mouth every 8 (eight) hours as needed., Disp: 30 tablet, Rfl: 0   ketoconazole (NIZORAL) 2 % cream, Apply 1 application topically daily., Disp: 15 g, Rfl: 0   levocetirizine (XYZAL) 5 MG tablet, Take 5 mg by mouth every evening., Disp: , Rfl:    Multiple Vitamin (MULTIVITAMIN ADULT PO), Take by mouth., Disp: , Rfl:    pantoprazole (PROTONIX) 40 MG tablet, Take 1 tablet (40 mg total) by mouth every other day., Disp: 90 tablet, Rfl: 1   propranolol (INDERAL) 20 MG tablet, Take 1 tablet (20 mg total) by mouth 3 (three) times daily as needed., Disp: 24 tablet, Rfl: 0   Semaglutide-Weight Management (WEGOVY) 1.7 MG/0.75ML SOAJ, 0.75 mL Subcutaneous once a week 30 day(s), Disp: 3 mL, Rfl: 0   Turmeric Curcumin 500 MG CAPS, Take 500 mg by mouth., Disp: , Rfl:    Medications ordered in this encounter:  No orders of  the defined types were placed in this encounter.    *If you need refills on other medications prior to your next appointment, please contact your pharmacy*  Follow-Up: Call back or seek an in-person evaluation if the symptoms worsen or if the condition fails to improve as anticipated.  [place] [phone]  Other Instructions Continue using neti pot 2-3 times per day. Use distilled water in the neti pot.   Continue using Mucinex and drinking lots of liquids.    If you have been instructed to have an in-person evaluation today at a local Urgent Care facility, please use the link below. It will take you to a list of all of our available [facility], including address, phone number and hours of operation. Please do not delay care.  [facility]  If you or a family member do not have a primary care provider, use the link below to schedule a visit and establish care. When you choose a [facility] primary care physician or advanced practice provider, you gain a long-term partner in health. Find a Primary Care Provider  Learn more about [facility]'s in-office and virtual care options: [facility]

## 2022-06-06 DIAGNOSIS — J329 Chronic sinusitis, unspecified: Secondary | ICD-10-CM | POA: Diagnosis not present

## 2022-06-06 DIAGNOSIS — R632 Polyphagia: Secondary | ICD-10-CM | POA: Diagnosis not present

## 2022-06-06 DIAGNOSIS — F4321 Adjustment disorder with depressed mood: Secondary | ICD-10-CM | POA: Diagnosis not present

## 2022-06-06 DIAGNOSIS — E669 Obesity, unspecified: Secondary | ICD-10-CM | POA: Diagnosis not present

## 2022-06-10 ENCOUNTER — Other Ambulatory Visit (HOSPITAL_BASED_OUTPATIENT_CLINIC_OR_DEPARTMENT_OTHER): Payer: Self-pay

## 2022-06-10 MED ORDER — PREDNISONE 5 MG PO TABS
ORAL_TABLET | ORAL | 0 refills | Status: DC
  Filled 2022-06-10: qty 21, 6d supply, fill #0

## 2022-07-03 ENCOUNTER — Other Ambulatory Visit (HOSPITAL_BASED_OUTPATIENT_CLINIC_OR_DEPARTMENT_OTHER): Payer: Self-pay

## 2022-07-03 DIAGNOSIS — Z8639 Personal history of other endocrine, nutritional and metabolic disease: Secondary | ICD-10-CM | POA: Diagnosis not present

## 2022-07-03 DIAGNOSIS — R632 Polyphagia: Secondary | ICD-10-CM | POA: Diagnosis not present

## 2022-07-03 DIAGNOSIS — R7303 Prediabetes: Secondary | ICD-10-CM | POA: Diagnosis not present

## 2022-07-03 MED ORDER — ZEPBOUND 7.5 MG/0.5ML ~~LOC~~ SOAJ
7.5000 mg | SUBCUTANEOUS | 0 refills | Status: DC
  Filled 2022-07-03: qty 2, 28d supply, fill #0

## 2022-07-04 ENCOUNTER — Other Ambulatory Visit (HOSPITAL_BASED_OUTPATIENT_CLINIC_OR_DEPARTMENT_OTHER): Payer: Self-pay

## 2022-07-05 ENCOUNTER — Other Ambulatory Visit (HOSPITAL_BASED_OUTPATIENT_CLINIC_OR_DEPARTMENT_OTHER): Payer: Self-pay

## 2022-07-16 ENCOUNTER — Encounter: Payer: Self-pay | Admitting: Physician Assistant

## 2022-08-20 DIAGNOSIS — E669 Obesity, unspecified: Secondary | ICD-10-CM | POA: Diagnosis not present

## 2022-08-20 DIAGNOSIS — R21 Rash and other nonspecific skin eruption: Secondary | ICD-10-CM | POA: Diagnosis not present

## 2022-08-20 DIAGNOSIS — R632 Polyphagia: Secondary | ICD-10-CM | POA: Diagnosis not present

## 2022-08-20 DIAGNOSIS — G479 Sleep disorder, unspecified: Secondary | ICD-10-CM | POA: Diagnosis not present

## 2022-10-03 DIAGNOSIS — E663 Overweight: Secondary | ICD-10-CM | POA: Diagnosis not present

## 2022-10-03 DIAGNOSIS — G479 Sleep disorder, unspecified: Secondary | ICD-10-CM | POA: Diagnosis not present

## 2022-10-03 DIAGNOSIS — K219 Gastro-esophageal reflux disease without esophagitis: Secondary | ICD-10-CM | POA: Diagnosis not present

## 2022-10-03 DIAGNOSIS — R7303 Prediabetes: Secondary | ICD-10-CM | POA: Diagnosis not present

## 2022-10-07 DIAGNOSIS — L239 Allergic contact dermatitis, unspecified cause: Secondary | ICD-10-CM | POA: Diagnosis not present

## 2022-11-21 DIAGNOSIS — Z9189 Other specified personal risk factors, not elsewhere classified: Secondary | ICD-10-CM | POA: Diagnosis not present

## 2022-11-21 DIAGNOSIS — F3289 Other specified depressive episodes: Secondary | ICD-10-CM | POA: Diagnosis not present

## 2022-11-21 DIAGNOSIS — R632 Polyphagia: Secondary | ICD-10-CM | POA: Diagnosis not present

## 2022-11-21 DIAGNOSIS — R7303 Prediabetes: Secondary | ICD-10-CM | POA: Diagnosis not present

## 2023-01-01 ENCOUNTER — Other Ambulatory Visit (HOSPITAL_BASED_OUTPATIENT_CLINIC_OR_DEPARTMENT_OTHER): Payer: Self-pay

## 2023-01-01 DIAGNOSIS — Z8639 Personal history of other endocrine, nutritional and metabolic disease: Secondary | ICD-10-CM | POA: Diagnosis not present

## 2023-01-01 DIAGNOSIS — R232 Flushing: Secondary | ICD-10-CM | POA: Diagnosis not present

## 2023-01-01 DIAGNOSIS — R632 Polyphagia: Secondary | ICD-10-CM | POA: Diagnosis not present

## 2023-01-01 MED ORDER — ZEPBOUND 7.5 MG/0.5ML ~~LOC~~ SOAJ
7.5000 mg | SUBCUTANEOUS | 3 refills | Status: DC
  Filled 2023-01-01: qty 2, 28d supply, fill #0

## 2023-01-02 ENCOUNTER — Other Ambulatory Visit (HOSPITAL_BASED_OUTPATIENT_CLINIC_OR_DEPARTMENT_OTHER): Payer: Self-pay

## 2023-01-03 ENCOUNTER — Ambulatory Visit
Admission: RE | Admit: 2023-01-03 | Discharge: 2023-01-03 | Disposition: A | Discharge status: Home / Self Care | Payer: BC Managed Care – PPO | Source: Ambulatory Visit

## 2023-01-03 ENCOUNTER — Ambulatory Visit (HOSPITAL_COMMUNITY): Payer: BC Managed Care – PPO

## 2023-01-03 VITALS — BP 117/83 | HR 97 | Temp 101.2°F | Resp 16 | Ht 62.0 in | Wt 160.0 lb

## 2023-01-03 DIAGNOSIS — J029 Acute pharyngitis, unspecified: Secondary | ICD-10-CM | POA: Diagnosis not present

## 2023-01-03 DIAGNOSIS — R509 Fever, unspecified: Secondary | ICD-10-CM

## 2023-01-03 DIAGNOSIS — R059 Cough, unspecified: Secondary | ICD-10-CM

## 2023-01-03 LAB — POCT RAPID STREP A (OFFICE): Rapid Strep A Screen: NEGATIVE

## 2023-01-03 MED ORDER — ACETAMINOPHEN 500 MG PO TABS
1000.0000 mg | ORAL_TABLET | Freq: Once | ORAL | Status: AC
  Administered 2023-01-03: 1000 mg via ORAL

## 2023-01-03 MED ORDER — PREDNISONE 20 MG PO TABS: ORAL_TABLET | ORAL | 0 refills | Status: DC

## 2023-01-03 MED ORDER — AZITHROMYCIN 250 MG PO TABS: 250.0000 mg | ORAL_TABLET | Freq: Every day | ORAL | 0 refills | Status: DC

## 2023-01-03 MED ORDER — FLUCONAZOLE 150 MG PO TABS: ORAL_TABLET | ORAL | 0 refills | Status: DC

## 2023-01-03 NOTE — ED Provider Notes (Signed)
[facility]    CSN: [id number] Arrival date & time: 01/03/23  1149      History   Chief Complaint Chief Complaint  Patient presents with   Sore Throat    HPI Kalaiah Darocha is a 52 y.o. female.   HPI pleasant 52 years old female presents with sore throat for 5 days, cough and congestion for 2 days and reports negative COVID test for 4 days in a row.  Patient reports using Mucinex & whiskey, honey, lemon shots last night.  Patient reports unable to sleep due to cough.  Patient is 101.2 oral temp and triage and reports falling out of town tomorrow.  Past Medical History:  Diagnosis Date   Allergy    seasonal   Family history of cancer    Family history of lung cancer    GERD (gastroesophageal reflux disease)    History of chicken pox    Venous thrombosis of leg    Left   Vitamin D deficiency     Patient Active Problem List   Diagnosis Date Noted   Genetic testing 10/31/2020   Family history of lung cancer    Family history of cancer    Primary insomnia 05/27/2018   Plantar fasciitis of left foot 06/24/2017   Neck pain 04/19/2016   Left shoulder pain 03/27/2016   History of DVT (deep vein thrombosis) 06/20/2014    Past Surgical History:  Procedure Laterality Date   BREAST SURGERY     Fibroidadenoma-Left   CHOLECYSTECTOMY     EYE SURGERY  2005   LASIK   Neck Fusion  06/2016   REFRACTIVE SURGERY     Bilateral   ruptured disc     SPINE SURGERY  2015; 2017   ruptured disc -  L5 S1; C5 C6   TONSILLECTOMY     WISDOM TOOTH EXTRACTION      OB History     Gravida  0   Para  0   Term  0   Preterm  0   AB  0   Living  0      SAB  0   IAB  0   Ectopic  0   Multiple  0   Live Births  0            Home Medications    Prior to Admission medications   Medication Sig Start Date End Date Taking? Authorizing Provider  azithromycin (ZITHROMAX) 250 MG tablet Take 1 tablet (250 mg total) by mouth daily. Take first 2 tablets  together, then 1 every day until finished. 01/03/23  Yes Trevor Iha, FNP  fluconazole (DIFLUCAN) 150 MG tablet Take 1 tab p.o. for vaginal candidiasis, may repeat 1 tab p.o. in 3 days if symptoms are not resolved. 01/03/23  Yes Trevor Iha, FNP  predniSONE (DELTASONE) 20 MG tablet Take 3 tabs PO daily x 5 days. 01/03/23  Yes Trevor Iha, FNP  CAMILA 0.35 MG tablet Take 1 tablet by mouth daily. 01/04/15   [provider]  Cholecalciferol (VITAMIN D3) 25 MCG (1000 UT) CAPS Take 1,000 Int'l Units by mouth every other day.    [provider]  clobetasol ointment (TEMOVATE) 0.05 %  04/07/19   [provider]  hydrocortisone 2.5 % cream Apply 1 application topically 2 (two) times daily as needed. 07/21/19   Waldon Merl, PA-C  ketoconazole (NIZORAL) 2 % cream Apply 1 application topically daily. 03/28/21   Helane Rima, DO  levocetirizine Elita Boone) 5  MG tablet Take 5 mg by mouth every evening.    [provider]  Multiple Vitamin (MULTI VITAMIN) TABS Take 1 tablet by mouth daily.    [provider]  pantoprazole (PROTONIX) 40 MG tablet Take 1 tablet (40 mg total) by mouth every other day. 10/16/21   Helane Rima, DO  Semaglutide-Weight Management (WEGOVY) 1.7 MG/0.75ML SOAJ 0.75 mL Subcutaneous once a week 30 day(s) 03/19/22     tirzepatide (ZEPBOUND) 7.5 MG/0.5ML Pen Inject 7.5 mg into the skin once a week. 01/01/23     Turmeric Curcumin 500 MG CAPS Take 500 mg by mouth.    [provider]    Family History Family History  Problem Relation Age of Onset   Brain cancer Mother 60       meningioma   Obesity Mother    Early death Mother    Stroke Mother    Alcohol abuse Father    Healthy Sister        x1   Cancer Maternal Grandmother        either colon or ovarian   Glaucoma Maternal Grandfather    Vision loss Maternal Grandfather    Glaucoma Maternal Aunt    Cancer Maternal Aunt    Lung cancer Maternal Aunt    Cancer Other         several mat great aunts/uncles; d. 16s, unknown cancers   Colon cancer Neg Hx    Colon polyps Neg Hx    Esophageal cancer Neg Hx    Stomach cancer Neg Hx    Rectal cancer Neg Hx     Social History Social History   Tobacco Use   Smoking status: Never   Smokeless tobacco: Never  Vaping Use   Vaping Use: Never used  Substance Use Topics   Alcohol use: Not Currently   Drug use: No     Allergies   Amoxil [amoxicillin] and Naproxen   Review of Systems Review of Systems  Constitutional:  Positive for fever.  HENT:  Positive for sore throat.   Respiratory:  Positive for cough.   All other systems reviewed and are negative.    Physical Exam Triage Vital Signs ED Triage Vitals  Enc Vitals Group     BP 01/03/23 1201 117/83     Pulse Rate 01/03/23 1201 97     Resp 01/03/23 1201 16     Temp 01/03/23 1201 (!) 101.2 F (38.4 C)     Temp Source 01/03/23 1201 Oral     SpO2 01/03/23 1201 98 %     Weight 01/03/23 1202 160 lb (72.6 kg)     Height 01/03/23 1202 5\' 2"  (1.575 m)     Head Circumference --      Peak Flow --      Pain Score 01/03/23 1202 4     Pain Loc --      Pain Edu? --      Excl. in GC? --    No data found.  Updated Vital Signs BP 117/83 (BP Location: Left Arm)   Pulse 97   Temp (!) 101.2 F (38.4 C) (Oral)   Resp 16   Ht 5\' 2"  (1.575 m)   Wt 160 lb (72.6 kg)   SpO2 98%   BMI 29.26 kg/m       Physical Exam Vitals reviewed.  Constitutional:      Appearance: Normal appearance. She is well-developed. She is obese. She is ill-appearing.  HENT:     Head:  Normocephalic and atraumatic.     Right Ear: Tympanic membrane, ear canal and external ear normal.     Left Ear: Tympanic membrane, ear canal and external ear normal.     Mouth/Throat:     Mouth: Mucous membranes are moist.     Pharynx: Oropharynx is clear. Uvula midline. Posterior oropharyngeal erythema and uvula swelling present.     Tonsils: 2+ on the right. 2+ on the left.  Eyes:      Extraocular Movements: Extraocular movements intact.     Conjunctiva/sclera: Conjunctivae normal.     Pupils: Pupils are equal, round, and reactive to light.  Cardiovascular:     Rate and Rhythm: Normal rate and regular rhythm.     Pulses: Normal pulses.     Heart sounds: Normal heart sounds. No murmur heard. Pulmonary:     Effort: Pulmonary effort is normal.     Breath sounds: Normal breath sounds. No wheezing, rhonchi or rales.     Comments: Infrequent nonproductive cough noted on exam Musculoskeletal:     Cervical back: Normal range of motion and neck supple.  Skin:    General: Skin is warm and dry.  Neurological:     General: No focal deficit present.     Mental Status: She is alert and oriented to person, place, and time.  Psychiatric:        Mood and Affect: Mood normal.        Behavior: Behavior normal.      UC Treatments / Results  Labs (all labs ordered are listed, but only abnormal results are displayed) Labs Reviewed  CULTURE, GROUP A STREP [facility])  POCT RAPID STREP A (OFFICE)    EKG   Radiology No results found.  Procedures Procedures (including critical care time)  Medications Ordered in UC Medications  acetaminophen (TYLENOL) tablet 1,000 mg (1,000 mg Oral Given 01/03/23 1215)    Initial Impression / Assessment and Plan / UC Course  I have reviewed the triage vital signs and the nursing notes.  Pertinent labs & imaging results that were available during my care of the patient were reviewed by me and considered in my medical decision making (see chart for details).     MDM: 1.  Acute pharyngitis, unspecified etiology-rapid strep negative, throat culture ordered Rx'd Zithromax (500 mg day 1, then 250 mg daily x 4 days); 2.  Cough, unspecified type-Rx'd prednisone 60 mg daily x 5 days; 3.  Fever-Tylenol 1000 mg given once in the clinic and prior to discharge.  Advised patient may take OTC Tylenol 1 g every 6 hours for fever. Instructed patient to take  medications as directed with food to completion.  Advised patient to take prednisone with Zithromax daily for the next 5 days.  Advised patient may take OTC Tylenol 1000 mg every 6 hours for fever (oral temperature greater than 100.3) encouraged increase daily water intake to 64 ounces per day while taking these medications.  Advised patient we will follow-up with throat culture results once received.  Advised if symptoms worsen and/or unresolved please follow-up with PCP or here for further evaluation.  Patient discharged home, hemodynamically stable.   Final Clinical Impressions(s) / UC Diagnoses   Final diagnoses:  Fever, unspecified  Acute pharyngitis, unspecified etiology  Cough, unspecified type     Discharge Instructions      Instructed patient to take medications as directed with food to completion.  Advised patient to take prednisone with Zithromax daily for the next 5 days.  Advised  patient may take OTC Tylenol 1000 mg every 6 hours for fever (oral temperature greater than 100.3) encouraged increase daily water intake to 64 ounces per day while taking these medications.  Advised patient we will follow-up with throat culture results once received.  Advised if symptoms worsen and/or unresolved please follow-up with PCP or here for further evaluation.     ED Prescriptions     Medication Sig Dispense Auth. Provider   azithromycin (ZITHROMAX) 250 MG tablet Take 1 tablet (250 mg total) by mouth daily. Take first 2 tablets together, then 1 every day until finished. 6 tablet Trevor Iha, FNP   predniSONE (DELTASONE) 20 MG tablet Take 3 tabs PO daily x 5 days. 15 tablet Trevor Iha, FNP   fluconazole (DIFLUCAN) 150 MG tablet Take 1 tab p.o. for vaginal candidiasis, may repeat 1 tab p.o. in 3 days if symptoms are not resolved. 7 tablet Trevor Iha, FNP      PDMP not reviewed this encounter.   Trevor Iha, FNP 01/03/23 1259

## 2023-01-03 NOTE — ED Triage Notes (Signed)
Sore throat since Monday  Cough and congestion since Wed  COVID test negative the last 4 days  OTC meds - mucinex & whiskey/honey/lemom shots last night  Unable to sleep due to cough Has not checked temp 101.2 temp in triage  Pt is flying out tomorrow

## 2023-01-03 NOTE — Discharge Instructions (Addendum)
Instructed patient to take medications as directed with food to completion.  Advised patient to take prednisone with Zithromax daily for the next 5 days.  Advised patient may take OTC Tylenol 1000 mg every 6 hours for fever (oral temperature greater than 100.3) encouraged increase daily water intake to 64 ounces per day while taking these medications.  Advised patient we will follow-up with throat culture results once received.  Advised if symptoms worsen and/or unresolved please follow-up with PCP or here for further evaluation.

## 2023-01-04 LAB — CULTURE, GROUP A STREP (THRC)

## 2023-01-05 LAB — CULTURE, GROUP A STREP (THRC)

## 2023-01-06 LAB — CULTURE, GROUP A STREP (THRC)

## 2023-01-30 ENCOUNTER — Encounter (HOSPITAL_BASED_OUTPATIENT_CLINIC_OR_DEPARTMENT_OTHER): Payer: Self-pay

## 2023-01-30 ENCOUNTER — Other Ambulatory Visit (HOSPITAL_BASED_OUTPATIENT_CLINIC_OR_DEPARTMENT_OTHER): Payer: Self-pay

## 2023-01-30 MED ORDER — ZEPBOUND 10 MG/0.5ML ~~LOC~~ SOAJ
10.0000 mg | SUBCUTANEOUS | 3 refills | Status: DC
  Filled 2023-01-30: qty 2, 28d supply, fill #0
  Filled 2023-02-21 (×2): qty 2, 28d supply, fill #1
  Filled 2023-11-14: qty 2, 28d supply, fill #2
  Filled 2023-12-15 – 2023-12-16 (×2): qty 2, 28d supply, fill #3

## 2023-01-31 ENCOUNTER — Other Ambulatory Visit (HOSPITAL_BASED_OUTPATIENT_CLINIC_OR_DEPARTMENT_OTHER): Payer: Self-pay

## 2023-02-19 DIAGNOSIS — R7303 Prediabetes: Secondary | ICD-10-CM | POA: Diagnosis not present

## 2023-02-19 DIAGNOSIS — E559 Vitamin D deficiency, unspecified: Secondary | ICD-10-CM | POA: Diagnosis not present

## 2023-02-19 DIAGNOSIS — Z79899 Other long term (current) drug therapy: Secondary | ICD-10-CM | POA: Diagnosis not present

## 2023-02-19 DIAGNOSIS — K219 Gastro-esophageal reflux disease without esophagitis: Secondary | ICD-10-CM | POA: Diagnosis not present

## 2023-02-19 DIAGNOSIS — R632 Polyphagia: Secondary | ICD-10-CM | POA: Diagnosis not present

## 2023-02-21 ENCOUNTER — Other Ambulatory Visit: Payer: Self-pay

## 2023-02-21 ENCOUNTER — Other Ambulatory Visit (HOSPITAL_BASED_OUTPATIENT_CLINIC_OR_DEPARTMENT_OTHER): Payer: Self-pay

## 2023-04-23 ENCOUNTER — Ambulatory Visit (INDEPENDENT_AMBULATORY_CARE_PROVIDER_SITE_OTHER): Payer: BC Managed Care – PPO

## 2023-04-23 DIAGNOSIS — Z23 Encounter for immunization: Secondary | ICD-10-CM

## 2023-05-05 DIAGNOSIS — Z6830 Body mass index (BMI) 30.0-30.9, adult: Secondary | ICD-10-CM | POA: Diagnosis not present

## 2023-05-05 DIAGNOSIS — Z01419 Encounter for gynecological examination (general) (routine) without abnormal findings: Secondary | ICD-10-CM | POA: Diagnosis not present

## 2023-05-05 DIAGNOSIS — Z1231 Encounter for screening mammogram for malignant neoplasm of breast: Secondary | ICD-10-CM | POA: Diagnosis not present

## 2023-05-05 DIAGNOSIS — Z1331 Encounter for screening for depression: Secondary | ICD-10-CM | POA: Diagnosis not present

## 2023-05-05 DIAGNOSIS — R92323 Mammographic fibroglandular density, bilateral breasts: Secondary | ICD-10-CM | POA: Diagnosis not present

## 2023-05-05 LAB — HM MAMMOGRAPHY

## 2023-05-06 DIAGNOSIS — Z6829 Body mass index (BMI) 29.0-29.9, adult: Secondary | ICD-10-CM | POA: Diagnosis not present

## 2023-05-06 DIAGNOSIS — Z8639 Personal history of other endocrine, nutritional and metabolic disease: Secondary | ICD-10-CM | POA: Diagnosis not present

## 2023-05-06 DIAGNOSIS — K219 Gastro-esophageal reflux disease without esophagitis: Secondary | ICD-10-CM | POA: Diagnosis not present

## 2023-05-06 DIAGNOSIS — R632 Polyphagia: Secondary | ICD-10-CM | POA: Diagnosis not present

## 2023-05-07 ENCOUNTER — Encounter: Payer: Self-pay | Admitting: Physician Assistant

## 2023-06-03 ENCOUNTER — Encounter: Payer: BC Managed Care – PPO | Admitting: Physician Assistant

## 2023-06-23 ENCOUNTER — Ambulatory Visit (INDEPENDENT_AMBULATORY_CARE_PROVIDER_SITE_OTHER): Payer: BC Managed Care – PPO | Admitting: Physician Assistant

## 2023-06-23 ENCOUNTER — Encounter: Payer: Self-pay | Admitting: Physician Assistant

## 2023-06-23 VITALS — BP 110/70 | HR 73 | Temp 97.7°F | Ht 61.25 in | Wt 157.2 lb

## 2023-06-23 DIAGNOSIS — E663 Overweight: Secondary | ICD-10-CM

## 2023-06-23 DIAGNOSIS — R0981 Nasal congestion: Secondary | ICD-10-CM

## 2023-06-23 DIAGNOSIS — R7303 Prediabetes: Secondary | ICD-10-CM

## 2023-06-23 DIAGNOSIS — E7800 Pure hypercholesterolemia, unspecified: Secondary | ICD-10-CM | POA: Diagnosis not present

## 2023-06-23 DIAGNOSIS — E559 Vitamin D deficiency, unspecified: Secondary | ICD-10-CM | POA: Diagnosis not present

## 2023-06-23 DIAGNOSIS — Z0000 Encounter for general adult medical examination without abnormal findings: Secondary | ICD-10-CM | POA: Diagnosis not present

## 2023-06-23 LAB — CBC WITH DIFFERENTIAL/PLATELET
Basophils Absolute: 0 10*3/uL (ref 0.0–0.1)
Basophils Relative: 0.8 % (ref 0.0–3.0)
Eosinophils Absolute: 0.2 10*3/uL (ref 0.0–0.7)
Eosinophils Relative: 3.8 % (ref 0.0–5.0)
HCT: 39.6 % (ref 36.0–46.0)
Hemoglobin: 12.8 g/dL (ref 12.0–15.0)
Lymphocytes Relative: 23 % (ref 12.0–46.0)
Lymphs Abs: 1.1 10*3/uL (ref 0.7–4.0)
MCHC: 32.3 g/dL (ref 30.0–36.0)
MCV: 77.6 fL — ABNORMAL LOW (ref 78.0–100.0)
Monocytes Absolute: 0.4 10*3/uL (ref 0.1–1.0)
Monocytes Relative: 7.9 % (ref 3.0–12.0)
Neutro Abs: 3.2 10*3/uL (ref 1.4–7.7)
Neutrophils Relative %: 64.5 % (ref 43.0–77.0)
Platelets: 304 10*3/uL (ref 150.0–400.0)
RBC: 5.1 Mil/uL (ref 3.87–5.11)
RDW: 17.1 % — ABNORMAL HIGH (ref 11.5–15.5)
WBC: 4.9 10*3/uL (ref 4.0–10.5)

## 2023-06-23 LAB — LIPID PANEL
Cholesterol: 188 mg/dL (ref 0–200)
HDL: 51.4 mg/dL (ref >39.00)
LDL Cholesterol: 114 mg/dL — ABNORMAL HIGH (ref 0–99)
NonHDL: 136.44
Total CHOL/HDL Ratio: 4
Triglycerides: 114 mg/dL (ref 0.0–149.0)
VLDL: 22.8 mg/dL (ref 0.0–40.0)

## 2023-06-23 LAB — COMPREHENSIVE METABOLIC PANEL
ALT: 14 U/L (ref 0–35)
AST: 20 U/L (ref 0–37)
Albumin: 4 g/dL (ref 3.5–5.2)
Alkaline Phosphatase: 74 U/L (ref 39–117)
BUN: 21 mg/dL (ref 6–23)
CO2: 24 meq/L (ref 19–32)
Calcium: 9.3 mg/dL (ref 8.4–10.5)
Chloride: 107 meq/L (ref 96–112)
Creatinine, Ser: 0.76 mg/dL (ref 0.40–1.20)
GFR: 89.47 mL/min (ref >60.00)
Glucose, Bld: 93 mg/dL (ref 70–99)
Potassium: 4.2 meq/L (ref 3.5–5.1)
Sodium: 140 meq/L (ref 135–145)
Total Bilirubin: 0.5 mg/dL (ref 0.2–1.2)
Total Protein: 6.7 g/dL (ref 6.0–8.3)

## 2023-06-23 LAB — HEMOGLOBIN A1C: Hgb A1c MFr Bld: 5.9 % (ref 4.6–6.5)

## 2023-06-23 LAB — VITAMIN D 25 HYDROXY (VIT D DEFICIENCY, FRACTURES): VITD: 44.59 ng/mL (ref 30.00–100.00)

## 2023-06-23 NOTE — Progress Notes (Signed)
Donna Mendoza is a 52 y.o. female and is here for a comprehensive physical exam.  HPI  Health Maintenance Due  Topic Date Due   Cervical Cancer Screening (HPV/Pap Cotest)  03/25/2023   Chief Complaint  Patient presents with   Annual Exam   Nasal Congestion    Had a cold for 2.5 weeks, Tested for COVID Neg, still having nasal congestion.   Acute Concerns: Nasal Congestion: Complains of nasal congestion for the past 2.5 weeks.  Reports she did test herself for Covid, with a NEG result.  States she's tried using a EchoStar, Mucinex, or Flonase.  Only uses Xyzal in the spring for her allergies, usually stops taking it in October.  Denies sore throat or fever/chills.   Chronic Issues: Overweight Managed with 10 mg Zepbound. Reports that she tolerated the 7.5 mg dose better.  Followed by Helane Rima  HLD Currently not on medication  Health Maintenance: Immunizations -- UpToDate; would like to get up the updated COVID vaccine Colonoscopy -- Last done 05/18/20. Diverticulosis found in sigmoid colon, non-bleeding internal hemorrhoids found, otherwise normal. Repeat 2031. Mammogram -- Last done 05/07/23. Results were normal. Repeat 2025. PAP -- Via patient report last done Oct 2024 with normal results. Bone Density -- N/A Diet -- Healthy overall Exercise -- Doesn't regularly exercise  Sleep habits -- Doesn't usually sleep for 6 hour, at least, which is normal for her and doesn't affect her day to day.  Mood -- Stable  UTD with dentist? - Yes UTD with eye doctor? - Yes  Weight history: Wt Readings from Last 10 Encounters:  06/23/23 157 lb 4 oz (71.3 kg)  01/03/23 160 lb (72.6 kg)  04/24/22 174 lb (78.9 kg)  10/16/21 177 lb (80.3 kg)  09/11/21 175 lb (79.4 kg)  07/31/21 179 lb (81.2 kg)  06/29/21 186 lb 6 oz (84.5 kg)  06/18/21 184 lb (83.5 kg)  06/05/21 186 lb (84.4 kg)  05/08/21 190 lb (86.2 kg)   Body mass index is 29.47 kg/m. No LMP recorded. (Menstrual  status: Oral contraceptives).  Alcohol use:  reports that she does not currently use alcohol.  Tobacco use:  Tobacco Use: Low Risk  (06/23/2023)   Patient History    Smoking Tobacco Use: Never    Smokeless Tobacco Use: Never    Passive Exposure: Not on file   Eligible for lung cancer screening? no     06/23/2023    8:27 AM  Depression screen PHQ 2/9  Decreased Interest 0  Down, Depressed, Hopeless 0  PHQ - 2 Score 0    Other providers/specialists: Patient Care Team: Jarold Motto, [place] as PCP - General (Physician Assistant) Helane Rima, DO as Consulting Physician (Family Medicine) Janalyn Harder, MD (Inactive) as Consulting Physician (Dermatology) [facility] as Consulting Physician (Obstetrics and Gynecology)   PMHx, SurgHx, SocialHx, Medications, and Allergies were reviewed in the Visit Navigator and updated as appropriate.   Past Medical History:  Diagnosis Date   Allergy    seasonal   Family history of cancer    Family history of lung cancer    GERD (gastroesophageal reflux disease)    History of chicken pox    Venous thrombosis of leg    Left   Vitamin D deficiency     Past Surgical History:  Procedure Laterality Date   BREAST SURGERY     Fibroidadenoma-Left   CHOLECYSTECTOMY     EYE SURGERY  2005   LASIK   Neck Fusion  06/2016  REFRACTIVE SURGERY     Bilateral   ruptured disc     SPINE SURGERY  2015; 2017   ruptured disc -  L5 S1; C5 C6   TONSILLECTOMY     WISDOM TOOTH EXTRACTION     Family History  Problem Relation Age of Onset   Brain cancer Mother 60       meningioma   Obesity Mother    Early death Mother    Stroke Mother    Alcohol abuse Father    Healthy Sister        x1   Cancer Maternal Grandmother        either colon or ovarian   Glaucoma Maternal Grandfather    Vision loss Maternal Grandfather    Glaucoma Maternal Aunt    Cancer Maternal Aunt    Lung cancer Maternal Aunt    Cancer Other        several  mat great aunts/uncles; d. 48s, unknown cancers   Colon cancer Neg Hx    Colon polyps Neg Hx    Esophageal cancer Neg Hx    Stomach cancer Neg Hx    Rectal cancer Neg Hx    Social History   Tobacco Use   Smoking status: Never   Smokeless tobacco: Never  Vaping Use   Vaping status: Never Used  Substance Use Topics   Alcohol use: Not Currently   Drug use: No   Review of Systems:   Review of Systems  Constitutional:  Negative for chills, fever, malaise/fatigue and weight loss.  HENT:  Negative for hearing loss, sinus pain and sore throat.   Respiratory:  Negative for cough and hemoptysis.   Cardiovascular:  Negative for chest pain, palpitations, leg swelling and PND.  Gastrointestinal:  Negative for abdominal pain, constipation, diarrhea, heartburn, nausea and vomiting.  Genitourinary:  Negative for dysuria, frequency and urgency.  Musculoskeletal:  Negative for back pain, myalgias and neck pain.  Skin:  Negative for itching and rash.  Neurological:  Negative for dizziness, tingling, seizures and headaches.  Endo/Heme/Allergies:  Negative for polydipsia.  Psychiatric/Behavioral:  Negative for depression. The patient is not nervous/anxious.      Objective:   BP 110/70 (BP Location: Left Arm, Patient Position: Sitting, Cuff Size: Normal)   Pulse 73   Temp 97.7 F (36.5 C) (Temporal)   Ht 5' 1.25" (1.556 m)   Wt 157 lb 4 oz (71.3 kg)   SpO2 98%   BMI 29.47 kg/m  Body mass index is 29.47 kg/m.   General Appearance:    Alert, cooperative, no distress, appears stated age  Head:    Normocephalic, without obvious abnormality, atraumatic  Eyes:    PERRL, conjunctiva/corneas clear, EOM's intact, fundi    benign, both eyes  Ears:    Normal TM's and external ear canals, both ears  Nose:   Nares normal, septum midline, mucosa normal, no drainage    or sinus tenderness  Throat:   Lips, mucosa, and tongue normal; teeth and gums normal  Neck:   Supple, symmetrical, trachea  midline, no adenopathy;    thyroid:  no enlargement/tenderness/nodules; no carotid   bruit or JVD  Back:     Symmetric, no curvature, ROM normal, no CVA tenderness  Lungs:     Clear to auscultation bilaterally, respirations unlabored  Chest Wall:    No tenderness or deformity   Heart:    Regular rate and rhythm, S1 and S2 normal, no murmur, rub or gallop  Breast Exam:  Deferred  Abdomen:     Soft, non-tender, bowel sounds active all four quadrants,    no masses, no organomegaly  Genitalia:    Deferred   Extremities:   Extremities normal, atraumatic, no cyanosis or edema  Pulses:   2+ and symmetric all extremities  Skin:   Skin color, texture, turgor normal, no rashes or lesions  Lymph nodes:   Cervical, supraclavicular, and axillary nodes normal  Neurologic:   CNII-XII intact, normal strength, sensation and reflexes    throughout    Assessment/Plan:   Routine physical examination Today patient counseled on age appropriate routine health concerns for screening and prevention, each reviewed and up to date or declined. Immunizations reviewed and up to date or declined. Labs ordered and reviewed. Risk factors for depression reviewed and negative. Hearing function and visual acuity are intact. ADLs screened and addressed as needed. Functional ability and level of safety reviewed and appropriate. Education, counseling and referrals performed based on assessed risks today. Patient provided with a copy of personalized plan for preventive services.  Nasal congestion No obvious signs of infection; no antibiotic(s) warranted at this time Recommend resuming xyzal If no improvement, I have asked her to send me a message and we can trial astelin nasal spray  Pure hypercholesterolemia Update lipid panel and make recommendations accordingly  Prediabetes Management per Dr Earlene Plater; doing well with Zepbound 10 mg weekly  Overweight Continue efforts at healthy lifestyle  Vitamin D  deficiency Update vitamin D   I,Emily Lagle,acting as a Neurosurgeon for [employer], PA.,have documented all relevant documentation on the behalf of Jarold Motto, PA,as directed by  Jarold Motto, PA while in the presence of Jarold Motto, [place].   I, Jarold Motto, [place], have reviewed all documentation for this visit. The documentation on 06/23/23 for the exam, diagnosis, procedures, and orders are all accurate and complete.  Jarold Motto, PA-C [facility]

## 2023-06-23 NOTE — Patient Instructions (Addendum)
It was great to see you!  Try the xyzal for your nasal congestion; if no improvement, please message me and I will send in alternative nasal spray  Please go to the lab for blood work.   Our office will call you with your results unless you have chosen to receive results via MyChart.  If your blood work is normal we will follow-up each year for physicals and as scheduled for chronic medical problems.  If anything is abnormal we will treat accordingly and get you in for a follow-up.  Take care,  Donna Mendoza

## 2023-06-27 DIAGNOSIS — J329 Chronic sinusitis, unspecified: Secondary | ICD-10-CM | POA: Diagnosis not present

## 2023-06-27 DIAGNOSIS — K219 Gastro-esophageal reflux disease without esophagitis: Secondary | ICD-10-CM | POA: Diagnosis not present

## 2023-06-27 DIAGNOSIS — R7303 Prediabetes: Secondary | ICD-10-CM | POA: Diagnosis not present

## 2023-06-27 DIAGNOSIS — R632 Polyphagia: Secondary | ICD-10-CM | POA: Diagnosis not present

## 2023-07-04 ENCOUNTER — Other Ambulatory Visit (HOSPITAL_BASED_OUTPATIENT_CLINIC_OR_DEPARTMENT_OTHER): Payer: Self-pay

## 2023-07-04 MED ORDER — COVID-19 MRNA VAC-TRIS(PFIZER) 30 MCG/0.3ML IM SUSY
0.3000 mL | PREFILLED_SYRINGE | Freq: Once | INTRAMUSCULAR | 0 refills | Status: AC
  Filled 2023-07-04: qty 0.3, 1d supply, fill #0

## 2023-08-25 DIAGNOSIS — Z780 Asymptomatic menopausal state: Secondary | ICD-10-CM | POA: Diagnosis not present

## 2023-08-25 DIAGNOSIS — R7303 Prediabetes: Secondary | ICD-10-CM | POA: Diagnosis not present

## 2023-08-25 DIAGNOSIS — Z8639 Personal history of other endocrine, nutritional and metabolic disease: Secondary | ICD-10-CM | POA: Diagnosis not present

## 2023-08-25 DIAGNOSIS — E7800 Pure hypercholesterolemia, unspecified: Secondary | ICD-10-CM | POA: Diagnosis not present

## 2023-08-25 DIAGNOSIS — Z9189 Other specified personal risk factors, not elsewhere classified: Secondary | ICD-10-CM | POA: Diagnosis not present

## 2023-09-09 DIAGNOSIS — S99912A Unspecified injury of left ankle, initial encounter: Secondary | ICD-10-CM | POA: Diagnosis not present

## 2023-10-02 DIAGNOSIS — M62838 Other muscle spasm: Secondary | ICD-10-CM | POA: Diagnosis not present

## 2023-10-02 DIAGNOSIS — R7303 Prediabetes: Secondary | ICD-10-CM | POA: Diagnosis not present

## 2023-10-02 DIAGNOSIS — K219 Gastro-esophageal reflux disease without esophagitis: Secondary | ICD-10-CM | POA: Diagnosis not present

## 2023-10-02 DIAGNOSIS — R632 Polyphagia: Secondary | ICD-10-CM | POA: Diagnosis not present

## 2023-11-14 ENCOUNTER — Other Ambulatory Visit (HOSPITAL_BASED_OUTPATIENT_CLINIC_OR_DEPARTMENT_OTHER): Payer: Self-pay

## 2023-11-17 ENCOUNTER — Ambulatory Visit
Admission: RE | Admit: 2023-11-17 | Discharge: 2023-11-17 | Disposition: A | Discharge status: Home / Self Care | Source: Ambulatory Visit | Attending: Family Medicine | Admitting: Family Medicine

## 2023-11-17 ENCOUNTER — Ambulatory Visit (INDEPENDENT_AMBULATORY_CARE_PROVIDER_SITE_OTHER): Admitting: Family Medicine

## 2023-11-17 VITALS — BP 111/56 | Ht 62.0 in | Wt 155.0 lb

## 2023-11-17 DIAGNOSIS — M546 Pain in thoracic spine: Secondary | ICD-10-CM

## 2023-11-17 MED ORDER — PREDNISONE 10 MG PO TABS: ORAL_TABLET | ORAL | 0 refills | Status: DC

## 2023-11-17 NOTE — Patient Instructions (Signed)
 Get thoracic spine x-rays after you leave today. Take prednisone  dose pack x 6 days as directed. Consider physical therapy. Heat or ice 15 minutes at a time (whichever feels better). Also consider MRI thoracic spine if you're not improving. Follow up with me in 1 month but call me sooner if this isn't going the right direction.

## 2023-11-18 ENCOUNTER — Encounter: Payer: Self-pay | Admitting: Family Medicine

## 2023-11-18 NOTE — Progress Notes (Signed)
 PCP: Alexander Iba, PA  Subjective:   HPI: Patient is a 53 y.o. female here for right shoulder pain.  Patient reports about 6 weeks ago she started to get pain in right posterior shoulder. Pain radiates around posteriorly, spares a little in lateral aspect then comes around to anterior chest. Feels like a sunburn. Pain in scapula is achy. Feels weak. No numbness. No neck pain though has history of cervical disc herniations at C3-4 and C5-6.  Past Medical History:  Diagnosis Date   Allergy    seasonal   Family history of cancer    Family history of lung cancer    GERD (gastroesophageal reflux disease)    History of chicken pox    Venous thrombosis of leg    Left   Vitamin D  deficiency     Current Outpatient Medications on File Prior to Visit  Medication Sig Dispense Refill   CAMILA  0.35 MG tablet Take 1 tablet by mouth daily.  3   clobetasol ointment (TEMOVATE) 0.05 %      hydrocortisone  2.5 % cream Apply 1 application topically 2 (two) times daily as needed. 30 g 3   ketoconazole  (NIZORAL ) 2 % cream Apply 1 application topically daily. 15 g 0   levocetirizine (XYZAL) 5 MG tablet Take 5 mg by mouth every evening.     pantoprazole  (PROTONIX ) 40 MG tablet Take 1 tablet (40 mg total) by mouth every other day. 90 tablet 1   tirzepatide  (ZEPBOUND ) 10 MG/0.5ML Pen Inject 10 mg into the skin once a week. 2 mL 3   No current facility-administered medications on file prior to visit.    Past Surgical History:  Procedure Laterality Date   BREAST SURGERY     Fibroidadenoma-Left   CHOLECYSTECTOMY     EYE SURGERY  2005   LASIK   Neck Fusion  06/2016   REFRACTIVE SURGERY     Bilateral   ruptured disc     SPINE SURGERY  2015; 2017   ruptured disc -  L5 S1; C5 C6   TONSILLECTOMY     WISDOM TOOTH EXTRACTION      Allergies  Allergen Reactions   Amoxil [Amoxicillin] Hives   Naproxen Rash    BP (!) 111/56   Ht 5\' 2"  (1.575 m)   Wt 155 lb (70.3 kg)   BMI 28.35 kg/m        No data to display              No data to display              Objective:  Physical Exam:  Gen: NAD, comfortable in exam room  Neck: No gross deformity, swelling, bruising. No cervical paraspinal TTP.  No midline/bony TTP.  Mild tenderness right upper thoracic paraspinal region. FROM. BUE strength 5/5.   Sensation intact to light touch.   1+ equal reflexes in triceps, biceps, brachioradialis tendons. NV intact distal BUEs.  Right shoulder: No swelling, ecchymoses.  No gross deformity. No TTP. FROM. Negative Hawkins, Neers. Strength 5/5 with empty can and resisted internal/external rotation. NV intact distally.   Assessment & Plan:  1. Upper thoracic pain - patient's description of pain and distribution fit with a T2 or T3 radiculopathy.  Will obtain radiographs.  Start prednisone  dose pack.  Heat or ice.  Consider MRI thoracic spine if not improving.  Follow up in 1 month.

## 2023-11-21 ENCOUNTER — Encounter: Payer: Self-pay | Admitting: Family Medicine

## 2023-11-27 ENCOUNTER — Encounter: Payer: Self-pay | Admitting: Family Medicine

## 2023-12-16 ENCOUNTER — Other Ambulatory Visit (HOSPITAL_COMMUNITY): Payer: Self-pay

## 2023-12-16 ENCOUNTER — Other Ambulatory Visit (HOSPITAL_BASED_OUTPATIENT_CLINIC_OR_DEPARTMENT_OTHER): Payer: Self-pay

## 2023-12-16 DIAGNOSIS — L304 Erythema intertrigo: Secondary | ICD-10-CM | POA: Diagnosis not present

## 2023-12-16 DIAGNOSIS — R632 Polyphagia: Secondary | ICD-10-CM | POA: Diagnosis not present

## 2023-12-16 DIAGNOSIS — L987 Excessive and redundant skin and subcutaneous tissue: Secondary | ICD-10-CM | POA: Diagnosis not present

## 2023-12-16 DIAGNOSIS — Z8639 Personal history of other endocrine, nutritional and metabolic disease: Secondary | ICD-10-CM | POA: Diagnosis not present

## 2024-01-29 ENCOUNTER — Other Ambulatory Visit (HOSPITAL_BASED_OUTPATIENT_CLINIC_OR_DEPARTMENT_OTHER): Payer: Self-pay

## 2024-01-29 DIAGNOSIS — R21 Rash and other nonspecific skin eruption: Secondary | ICD-10-CM | POA: Diagnosis not present

## 2024-01-29 DIAGNOSIS — R632 Polyphagia: Secondary | ICD-10-CM | POA: Diagnosis not present

## 2024-01-29 DIAGNOSIS — K219 Gastro-esophageal reflux disease without esophagitis: Secondary | ICD-10-CM | POA: Diagnosis not present

## 2024-01-29 DIAGNOSIS — R7303 Prediabetes: Secondary | ICD-10-CM | POA: Diagnosis not present

## 2024-01-29 MED ORDER — ZEPBOUND 10 MG/0.5ML ~~LOC~~ SOAJ
10.0000 mg | SUBCUTANEOUS | 3 refills | Status: DC
  Filled 2024-01-29: qty 2, 28d supply, fill #0
  Filled 2024-02-23: qty 2, 28d supply, fill #1
  Filled 2024-03-28: qty 2, 28d supply, fill #2
  Filled 2024-04-27: qty 2, 28d supply, fill #3

## 2024-01-30 ENCOUNTER — Other Ambulatory Visit (HOSPITAL_BASED_OUTPATIENT_CLINIC_OR_DEPARTMENT_OTHER): Payer: Self-pay

## 2024-01-30 MED ORDER — HYDROCORTISONE 2.5 % EX CREA
1.0000 | TOPICAL_CREAM | Freq: Two times a day (BID) | CUTANEOUS | 0 refills | Status: AC | PRN
  Filled 2024-01-30: qty 30, 30d supply, fill #0

## 2024-01-30 MED ORDER — KETOCONAZOLE 2 % EX CREA
1.0000 | TOPICAL_CREAM | Freq: Every day | CUTANEOUS | 3 refills | Status: DC | PRN
  Filled 2024-01-30: qty 15, 30d supply, fill #0

## 2024-03-06 ENCOUNTER — Encounter: Payer: Self-pay | Admitting: Physician Assistant

## 2024-03-15 ENCOUNTER — Encounter: Payer: Self-pay | Admitting: Physician Assistant

## 2024-03-15 ENCOUNTER — Telehealth: Payer: Self-pay | Admitting: *Deleted

## 2024-03-15 NOTE — Telephone Encounter (Signed)
 Copied from CRM #[id number]. Topic: Clinical - Request for Lab/Test Order >> Mar 15, 2024  1:56 PM Donna Mendoza wrote: Reason for CRM: Patient sent MyChart message today with attachment of the labs she needs for Brachioplasty surgery and needs labs done by September 10th - please call patient to schedule once lab orders are in.  My Chart message sent to pt stating we can not do labs here.

## 2024-03-17 DIAGNOSIS — Z01812 Encounter for preprocedural laboratory examination: Secondary | ICD-10-CM | POA: Diagnosis not present

## 2024-03-30 DIAGNOSIS — E663 Overweight: Secondary | ICD-10-CM | POA: Diagnosis not present

## 2024-03-30 DIAGNOSIS — L987 Excessive and redundant skin and subcutaneous tissue: Secondary | ICD-10-CM | POA: Diagnosis not present

## 2024-03-30 DIAGNOSIS — Z8639 Personal history of other endocrine, nutritional and metabolic disease: Secondary | ICD-10-CM | POA: Diagnosis not present

## 2024-03-30 DIAGNOSIS — R7303 Prediabetes: Secondary | ICD-10-CM | POA: Diagnosis not present

## 2024-05-03 ENCOUNTER — Other Ambulatory Visit (HOSPITAL_BASED_OUTPATIENT_CLINIC_OR_DEPARTMENT_OTHER): Payer: Self-pay

## 2024-05-03 DIAGNOSIS — Z1231 Encounter for screening mammogram for malignant neoplasm of breast: Secondary | ICD-10-CM | POA: Diagnosis not present

## 2024-05-03 DIAGNOSIS — R7303 Prediabetes: Secondary | ICD-10-CM | POA: Diagnosis not present

## 2024-05-03 DIAGNOSIS — L987 Excessive and redundant skin and subcutaneous tissue: Secondary | ICD-10-CM | POA: Diagnosis not present

## 2024-05-03 DIAGNOSIS — Z8639 Personal history of other endocrine, nutritional and metabolic disease: Secondary | ICD-10-CM | POA: Diagnosis not present

## 2024-05-03 DIAGNOSIS — E663 Overweight: Secondary | ICD-10-CM | POA: Diagnosis not present

## 2024-05-03 LAB — HM MAMMOGRAPHY

## 2024-05-03 MED ORDER — ZEPBOUND 10 MG/0.5ML ~~LOC~~ SOAJ
10.0000 mg | SUBCUTANEOUS | 1 refills | Status: DC
  Filled 2024-05-03: qty 2, 30d supply, fill #0
  Filled 2024-05-19: qty 2, 28d supply, fill #0
  Filled 2024-06-28: qty 2, 28d supply, fill #1

## 2024-05-04 ENCOUNTER — Encounter: Payer: Self-pay | Admitting: Physician Assistant

## 2024-05-05 ENCOUNTER — Other Ambulatory Visit (HOSPITAL_BASED_OUTPATIENT_CLINIC_OR_DEPARTMENT_OTHER): Payer: Self-pay

## 2024-05-13 ENCOUNTER — Other Ambulatory Visit (HOSPITAL_BASED_OUTPATIENT_CLINIC_OR_DEPARTMENT_OTHER): Payer: Self-pay

## 2024-05-17 ENCOUNTER — Other Ambulatory Visit (HOSPITAL_BASED_OUTPATIENT_CLINIC_OR_DEPARTMENT_OTHER): Payer: Self-pay

## 2024-05-19 ENCOUNTER — Other Ambulatory Visit: Payer: Self-pay

## 2024-05-19 ENCOUNTER — Other Ambulatory Visit (HOSPITAL_BASED_OUTPATIENT_CLINIC_OR_DEPARTMENT_OTHER): Payer: Self-pay

## 2024-05-20 ENCOUNTER — Other Ambulatory Visit (HOSPITAL_BASED_OUTPATIENT_CLINIC_OR_DEPARTMENT_OTHER): Payer: Self-pay

## 2024-05-20 MED ORDER — FLUZONE 0.5 ML IM SUSY
0.5000 mL | PREFILLED_SYRINGE | Freq: Once | INTRAMUSCULAR | 0 refills | Status: AC
  Filled 2024-05-20: qty 0.5, 1d supply, fill #0

## 2024-06-29 ENCOUNTER — Other Ambulatory Visit (HOSPITAL_BASED_OUTPATIENT_CLINIC_OR_DEPARTMENT_OTHER): Payer: Self-pay

## 2024-06-29 MED ORDER — COMIRNATY 30 MCG/0.3ML IM SUSY
0.3000 mL | PREFILLED_SYRINGE | Freq: Once | INTRAMUSCULAR | 0 refills | Status: AC
  Filled 2024-06-29: qty 0.3, 1d supply, fill #0

## 2024-07-02 ENCOUNTER — Encounter: Payer: Self-pay | Admitting: Family Medicine

## 2024-07-02 ENCOUNTER — Ambulatory Visit: Admitting: Family Medicine

## 2024-07-02 VITALS — BP 104/78 | HR 74 | Ht 62.0 in | Wt 150.6 lb

## 2024-07-02 DIAGNOSIS — Z79899 Other long term (current) drug therapy: Secondary | ICD-10-CM

## 2024-07-02 DIAGNOSIS — E559 Vitamin D deficiency, unspecified: Secondary | ICD-10-CM | POA: Diagnosis not present

## 2024-07-02 DIAGNOSIS — K219 Gastro-esophageal reflux disease without esophagitis: Secondary | ICD-10-CM | POA: Diagnosis not present

## 2024-07-02 DIAGNOSIS — N912 Amenorrhea, unspecified: Secondary | ICD-10-CM

## 2024-07-02 DIAGNOSIS — E7800 Pure hypercholesterolemia, unspecified: Secondary | ICD-10-CM | POA: Diagnosis not present

## 2024-07-02 DIAGNOSIS — R7303 Prediabetes: Secondary | ICD-10-CM | POA: Diagnosis not present

## 2024-07-02 LAB — CBC WITH DIFFERENTIAL/PLATELET
Basophils Absolute: 0 K/uL (ref 0.0–0.1)
Basophils Relative: 0.6 % (ref 0.0–3.0)
Eosinophils Absolute: 0.1 K/uL (ref 0.0–0.7)
Eosinophils Relative: 2.7 % (ref 0.0–5.0)
HCT: 33.1 % — ABNORMAL LOW (ref 36.0–46.0)
Hemoglobin: 10.3 g/dL — ABNORMAL LOW (ref 12.0–15.0)
Lymphocytes Relative: 25 % (ref 12.0–46.0)
Lymphs Abs: 1 K/uL (ref 0.7–4.0)
MCHC: 31.1 g/dL (ref 30.0–36.0)
MCV: 67.9 fl — ABNORMAL LOW (ref 78.0–100.0)
Monocytes Absolute: 0.3 K/uL (ref 0.1–1.0)
Monocytes Relative: 8.5 % (ref 3.0–12.0)
Neutro Abs: 2.4 K/uL (ref 1.4–7.7)
Neutrophils Relative %: 63.2 % (ref 43.0–77.0)
Platelets: 327 K/uL (ref 150.0–400.0)
RBC: 4.87 Mil/uL (ref 3.87–5.11)
RDW: 18.2 % — ABNORMAL HIGH (ref 11.5–15.5)
WBC: 3.9 K/uL — ABNORMAL LOW (ref 4.0–10.5)

## 2024-07-02 LAB — LIPID PANEL
Cholesterol: 165 mg/dL (ref 0–200)
HDL: 62.6 mg/dL (ref >39.00)
LDL Cholesterol: 90 mg/dL (ref 0–99)
NonHDL: 102.55
Total CHOL/HDL Ratio: 3
Triglycerides: 61 mg/dL (ref 0.0–149.0)
VLDL: 12.2 mg/dL (ref 0.0–40.0)

## 2024-07-02 LAB — VITAMIN B12: Vitamin B-12: 344 pg/mL (ref 211–911)

## 2024-07-02 LAB — COMPREHENSIVE METABOLIC PANEL WITH GFR
ALT: 18 U/L (ref 0–35)
AST: 23 U/L (ref 0–37)
Albumin: 4.2 g/dL (ref 3.5–5.2)
Alkaline Phosphatase: 79 U/L (ref 39–117)
BUN: 17 mg/dL (ref 6–23)
CO2: 27 meq/L (ref 19–32)
Calcium: 9.3 mg/dL (ref 8.4–10.5)
Chloride: 106 meq/L (ref 96–112)
Creatinine, Ser: 0.69 mg/dL (ref 0.40–1.20)
GFR: 98.38 mL/min (ref >60.00)
Glucose, Bld: 94 mg/dL (ref 70–99)
Potassium: 4 meq/L (ref 3.5–5.1)
Sodium: 139 meq/L (ref 135–145)
Total Bilirubin: 0.4 mg/dL (ref 0.2–1.2)
Total Protein: 6.9 g/dL (ref 6.0–8.3)

## 2024-07-02 LAB — TSH: TSH: 1.41 u[IU]/mL (ref 0.35–5.50)

## 2024-07-02 LAB — HEMOGLOBIN A1C: Hgb A1c MFr Bld: 5.6 % (ref 4.6–6.5)

## 2024-07-02 LAB — VITAMIN D 25 HYDROXY (VIT D DEFICIENCY, FRACTURES): VITD: 30.82 ng/mL (ref 30.00–100.00)

## 2024-07-02 LAB — FOLLICLE STIMULATING HORMONE: FSH: 107.7 m[IU]/mL

## 2024-07-02 MED ORDER — ZEPBOUND 10 MG/0.5ML ~~LOC~~ SOAJ: 10.0000 mg | SUBCUTANEOUS | 1 refills | Status: DC

## 2024-07-02 MED ORDER — NIRMATRELVIR/RITONAVIR (PAXLOVID)TABLET: 3.0000 | ORAL_TABLET | Freq: Two times a day (BID) | ORAL | 0 refills | Status: AC

## 2024-07-02 NOTE — Progress Notes (Signed)
 Assessment & Plan:   1. Gastroesophageal reflux disease without esophagitis, takes Protonix  qod (Primary) Comments: Stable.  2. Prediabetes - CBC with Differential/Platelet - Comprehensive metabolic panel with GFR - Hemoglobin A1c  3. History of obesity - tirzepatide  (ZEPBOUND ) 10 MG/0.5ML Pen; Inject 10 mg into the skin once a week.  Dispense: 6 mL; Refill: 1  4. Vitamin D  deficiency - VITAMIN D  25 Hydroxy (Vit-D Deficiency, Fractures)  5. Pure hypercholesterolemia - Lipid panel  6. Medication management - TSH - Vitamin B12  7. Amenorrhea - Iron, TIBC and Ferritin Panel - Follicle stimulating hormone  8. Hx of cosmetic plastic surgery, brachioplasty and abdominoplasty, 9/242025, Dr. Elisabeth Geni Shutter, DO, MS, FAAFP, Dipl. [facility] [address] [place], [address] Dept: [phone] Dept Fax: [phone]  Subjective:   Patient is well-known to me from previous care setting and is establishing care in this system with me as PCP. Prior records reviewed when available. Chart updated today with reconciliation of problem list, medications, allergies, and relevant history. Preventive care and chronic disease status reviewed. Portions of historical chart may remain incomplete; will update on an ongoing basis as clinically indicated.  Review of Systems: Negative, with the exception of above mentioned in HPI. Current Medications[1]   Objective:   BP 104/78 (BP Location: Left Arm, Cuff Size: Normal)   Pulse 74   Ht 5' 2 (1.575 m)   Wt 150 lb 9.6 oz (68.3 kg)   LMP  (LMP Unknown)   SpO2 100%   BMI 27.55 kg/m   Wt Readings from Last 3 Encounters:  07/02/24 150 lb 9.6 oz (68.3 kg)  11/17/23 155 lb (70.3 kg)  06/23/23 157 lb 4 oz (71.3 kg)   Physical Exam Constitutional:      General: She is not in acute distress.    Appearance: She is well-developed.  HENT:     Head: Normocephalic and atraumatic.  Eyes:      Conjunctiva/sclera: Conjunctivae normal.  Cardiovascular:     Rate and Rhythm: Normal rate and regular rhythm.     Heart sounds: Normal heart sounds.  Pulmonary:     Effort: Pulmonary effort is normal.     Breath sounds: Normal breath sounds.  Skin:    Comments: Incisions healed well.  Neurological:     General: No focal deficit present.     Mental Status: She is alert.  Psychiatric:        Behavior: Behavior normal.    Results for orders placed or performed in visit on 07/02/24  CBC with Differential/Platelet   Collection Time: 07/02/24  9:45 AM  Result Value Ref Range   WBC 3.9 (L) 4.0 - 10.5 K/uL   RBC 4.87 3.87 - 5.11 Mil/uL   Hemoglobin 10.3 (L) 12.0 - 15.0 g/dL   HCT 66.8 (L) 63.9 - 53.9 %   MCV 67.9 Repeated and verified X2. (L) 78.0 - 100.0 fl   MCHC 31.1 30.0 - 36.0 g/dL   RDW 81.7 (H) 88.4 - 84.4 %   Platelets 327.0 150.0 - 400.0 K/uL   Neutrophils Relative % 63.2 43.0 - 77.0 %   Lymphocytes Relative 25.0 12.0 - 46.0 %   Monocytes Relative 8.5 3.0 - 12.0 %   Eosinophils Relative 2.7 0.0 - 5.0 %   Basophils Relative 0.6 0.0 - 3.0 %   Neutro Abs 2.4 1.4 - 7.7 K/uL   Lymphs Abs 1.0 0.7 - 4.0 K/uL   Monocytes Absolute 0.3  0.1 - 1.0 K/uL   Eosinophils Absolute 0.1 0.0 - 0.7 K/uL   Basophils Absolute 0.0 0.0 - 0.1 K/uL  Comprehensive metabolic panel with GFR   Collection Time: 07/02/24  9:45 AM  Result Value Ref Range   Sodium 139 135 - 145 mEq/L   Potassium 4.0 3.5 - 5.1 mEq/L   Chloride 106 96 - 112 mEq/L   CO2 27 19 - 32 mEq/L   Glucose, Bld 94 70 - 99 mg/dL   BUN 17 6 - 23 mg/dL   Creatinine, Ser 9.30 0.40 - 1.20 mg/dL   Total Bilirubin 0.4 0.2 - 1.2 mg/dL   Alkaline Phosphatase 79 39 - 117 U/L   AST 23 0 - 37 U/L   ALT 18 0 - 35 U/L   Total Protein 6.9 6.0 - 8.3 g/dL   Albumin 4.2 3.5 - 5.2 g/dL   GFR 01.61 >39.99 mL/min   Calcium 9.3 8.4 - 10.5 mg/dL  Hemoglobin J8r   Collection Time: 07/02/24  9:45 AM  Result Value Ref Range   Hgb A1c MFr Bld  5.6 4.6 - 6.5 %  TSH   Collection Time: 07/02/24  9:45 AM  Result Value Ref Range   TSH 1.41 0.35 - 5.50 uIU/mL  VITAMIN D  25 Hydroxy (Vit-D Deficiency, Fractures)   Collection Time: 07/02/24  9:45 AM  Result Value Ref Range   VITD 30.82 30.00 - 100.00 ng/mL  Vitamin B12   Collection Time: 07/02/24  9:45 AM  Result Value Ref Range   Vitamin B-12 344 211 - 911 pg/mL  Lipid panel   Collection Time: 07/02/24  9:45 AM  Result Value Ref Range   Cholesterol 165 0 - 200 mg/dL   Triglycerides 38.9 0.0 - 149.0 mg/dL   HDL 37.39 >60.99 mg/dL   VLDL 87.7 0.0 - 59.9 mg/dL   LDL Cholesterol 90 0 - 99 mg/dL   Total CHOL/HDL Ratio 3    NonHDL 102.55   Follicle stimulating hormone   Collection Time: 07/02/24  9:45 AM  Result Value Ref Range   FSH 107.7 mIU/ML      [1]  Current Outpatient Medications:    clobetasol ointment (TEMOVATE) 0.05 %, , Disp: , Rfl:    hydrocortisone  2.5 % cream, Apply 1 application topically 2 (two) times daily as needed., Disp: 30 g, Rfl: 3   hydrocortisone  2.5 % cream, Apply to the affected area(s) (rash) topically 2 (two) times daily as needed., Disp: 30 g, Rfl: 0   ketoconazole  (NIZORAL ) 2 % cream, Apply 1 application topically daily., Disp: 15 g, Rfl: 0   levocetirizine (XYZAL) 5 MG tablet, Take 5 mg by mouth every evening., Disp: , Rfl:    nirmatrelvir /ritonavir  (PAXLOVID ) 20 x 150 MG & 10 x 100MG  TABS, Take 3 tablets by mouth 2 (two) times daily for 5 days. (Take nirmatrelvir  150 mg two tablets twice daily for 5 days and ritonavir  100 mg one tablet twice daily for 5 days) Patient GFR is 89, Disp: 30 tablet, Rfl: 0   pantoprazole  (PROTONIX ) 40 MG tablet, Take 1 tablet (40 mg total) by mouth every other day., Disp: 90 tablet, Rfl: 1   tirzepatide  (ZEPBOUND ) 10 MG/0.5ML Pen, Inject 10 mg into the skin once a week., Disp: 6 mL, Rfl: 1

## 2024-07-03 ENCOUNTER — Other Ambulatory Visit (HOSPITAL_BASED_OUTPATIENT_CLINIC_OR_DEPARTMENT_OTHER): Payer: Self-pay

## 2024-07-03 DIAGNOSIS — E559 Vitamin D deficiency, unspecified: Secondary | ICD-10-CM | POA: Insufficient documentation

## 2024-07-03 DIAGNOSIS — E7800 Pure hypercholesterolemia, unspecified: Secondary | ICD-10-CM | POA: Insufficient documentation

## 2024-07-03 DIAGNOSIS — Z9889 Other specified postprocedural states: Secondary | ICD-10-CM | POA: Insufficient documentation

## 2024-07-03 DIAGNOSIS — Z8639 Personal history of other endocrine, nutritional and metabolic disease: Secondary | ICD-10-CM | POA: Insufficient documentation

## 2024-07-03 DIAGNOSIS — K219 Gastro-esophageal reflux disease without esophagitis: Secondary | ICD-10-CM | POA: Insufficient documentation

## 2024-07-03 DIAGNOSIS — Z6827 Body mass index (BMI) 27.0-27.9, adult: Secondary | ICD-10-CM | POA: Insufficient documentation

## 2024-07-03 DIAGNOSIS — R7303 Prediabetes: Secondary | ICD-10-CM | POA: Insufficient documentation

## 2024-07-03 LAB — IRON,TIBC AND FERRITIN PANEL
%SAT: 4 % — ABNORMAL LOW (ref 16–45)
Ferritin: 4 ng/mL — ABNORMAL LOW (ref 16–232)
Iron: 17 ug/dL — ABNORMAL LOW (ref 45–160)
TIBC: 458 ug/dL — ABNORMAL HIGH (ref 250–450)

## 2024-07-03 MED ORDER — ZEPBOUND 10 MG/0.5ML ~~LOC~~ SOAJ
10.0000 mg | SUBCUTANEOUS | 1 refills | Status: AC
  Filled 2024-07-03 – 2024-08-11 (×2): qty 6, 84d supply, fill #0

## 2024-07-05 ENCOUNTER — Encounter: Admitting: Physician Assistant

## 2024-07-06 NOTE — Telephone Encounter (Signed)
 Hey have you guys received any PA requests for this pt??

## 2024-07-06 NOTE — Telephone Encounter (Signed)
 Hey Dakotah,I have not received any paperwork. Angie - have you come across anything?

## 2024-07-06 NOTE — Telephone Encounter (Signed)
 Would we be able to start a PA through Cover My Meds? The message that [facility] sent in has a Key Number that is already assigned to the PA request. Let me know if this can't be done. Thank you!!

## 2024-07-06 NOTE — Telephone Encounter (Signed)
I havent received any paperwork

## 2024-07-07 ENCOUNTER — Other Ambulatory Visit (HOSPITAL_COMMUNITY): Payer: Self-pay

## 2024-07-07 ENCOUNTER — Telehealth: Payer: Self-pay

## 2024-07-07 NOTE — Telephone Encounter (Signed)
 Pharmacy Patient Advocate Encounter   Received notification from Physician's Office that prior authorization for Zepbound  10MG /0.5ML pen-injectors is required/requested.   Insurance verification completed.   The patient is insured through [employer].   Per test claim: PA required; PA submitted to above mentioned insurance via Latent Key/confirmation #/EOC [id number]  Status is pending

## 2024-07-08 ENCOUNTER — Other Ambulatory Visit (HOSPITAL_COMMUNITY): Payer: Self-pay

## 2024-07-08 NOTE — Telephone Encounter (Signed)
 Pharmacy Patient Advocate Encounter  Received notification from [facility] that Prior Authorization for Zepbound  10MG /0.5ML pen-injectors  has been APPROVED from 06/08/2024 to 07/08/2025. Unable to obtain price due to refill too soon rejection, last fill date 06/28/2024 next available fill date 07/20/2024   PA #/Case ID/Reference #: [id number]

## 2024-07-15 ENCOUNTER — Ambulatory Visit: Payer: Self-pay | Admitting: Family Medicine

## 2024-07-15 MED ORDER — VITAMIN D (ERGOCALCIFEROL) 1.25 MG (50000 UNIT) PO CAPS: 50000.0000 [IU] | ORAL_CAPSULE | ORAL | 0 refills | Status: AC

## 2024-07-26 DIAGNOSIS — J111 Influenza due to unidentified influenza virus with other respiratory manifestations: Secondary | ICD-10-CM

## 2024-07-26 NOTE — Telephone Encounter (Signed)
 Please review patient message and advise. Thanks. Dm/cma

## 2024-07-27 MED ORDER — OSELTAMIVIR PHOSPHATE 75 MG PO CAPS: 75.0000 mg | ORAL_CAPSULE | Freq: Two times a day (BID) | ORAL | 0 refills | Status: AC

## 2024-08-11 ENCOUNTER — Other Ambulatory Visit (HOSPITAL_BASED_OUTPATIENT_CLINIC_OR_DEPARTMENT_OTHER): Payer: Self-pay

## 2024-10-14 ENCOUNTER — Ambulatory Visit: Admitting: Family Medicine
